# Patient Record
Sex: Female | Born: 1935 | Race: White | Hispanic: No | Smoking: Never smoker
Health system: Southern US, Community
[De-identification: ages and names within clinical notes are randomized; demographics above are authoritative.]

## PROBLEM LIST (undated history)

## (undated) DIAGNOSIS — K219 Gastro-esophageal reflux disease without esophagitis: Secondary | ICD-10-CM

## (undated) DIAGNOSIS — F321 Major depressive disorder, single episode, moderate: Secondary | ICD-10-CM

## (undated) DIAGNOSIS — R7303 Prediabetes: Secondary | ICD-10-CM

## (undated) DIAGNOSIS — M810 Age-related osteoporosis without current pathological fracture: Secondary | ICD-10-CM

## (undated) DIAGNOSIS — T7840XA Allergy, unspecified, initial encounter: Secondary | ICD-10-CM

## (undated) DIAGNOSIS — E039 Hypothyroidism, unspecified: Secondary | ICD-10-CM

## (undated) DIAGNOSIS — C801 Malignant (primary) neoplasm, unspecified: Secondary | ICD-10-CM

## (undated) DIAGNOSIS — Z972 Presence of dental prosthetic device (complete) (partial): Secondary | ICD-10-CM

## (undated) DIAGNOSIS — M1612 Unilateral primary osteoarthritis, left hip: Secondary | ICD-10-CM

## (undated) DIAGNOSIS — I10 Essential (primary) hypertension: Secondary | ICD-10-CM

## (undated) DIAGNOSIS — E785 Hyperlipidemia, unspecified: Secondary | ICD-10-CM

## (undated) DIAGNOSIS — R42 Dizziness and giddiness: Secondary | ICD-10-CM

## (undated) DIAGNOSIS — E118 Type 2 diabetes mellitus with unspecified complications: Secondary | ICD-10-CM

## (undated) DIAGNOSIS — K900 Celiac disease: Secondary | ICD-10-CM

## (undated) HISTORY — DX: Hyperlipidemia, unspecified: E78.5

## (undated) HISTORY — DX: Age-related osteoporosis without current pathological fracture: M81.0

## (undated) HISTORY — DX: Unilateral primary osteoarthritis, left hip: M16.12

## (undated) HISTORY — DX: Allergy, unspecified, initial encounter: T78.40XA

## (undated) HISTORY — PX: OTHER SURGICAL HISTORY: SHX169

## (undated) HISTORY — DX: Major depressive disorder, single episode, moderate: F32.1

## (undated) HISTORY — DX: Gastro-esophageal reflux disease without esophagitis: K21.9

---

## 1982-07-21 HISTORY — PX: BILATERAL SALPINGOOPHORECTOMY: SHX1223

## 1982-07-21 HISTORY — PX: ABDOMINAL HYSTERECTOMY: SHX81

## 1990-07-21 HISTORY — PX: TYMPANOPLASTY: SHX33

## 2004-12-02 ENCOUNTER — Ambulatory Visit: Payer: Self-pay | Admitting: Otolaryngology

## 2007-02-18 ENCOUNTER — Ambulatory Visit: Payer: Self-pay | Admitting: Internal Medicine

## 2009-10-29 ENCOUNTER — Ambulatory Visit: Payer: Self-pay | Admitting: Internal Medicine

## 2010-03-26 LAB — HM COLONOSCOPY: HM Colonoscopy: ABNORMAL

## 2010-11-14 ENCOUNTER — Ambulatory Visit: Payer: Self-pay | Admitting: Internal Medicine

## 2011-01-01 ENCOUNTER — Ambulatory Visit: Payer: Self-pay | Admitting: Internal Medicine

## 2011-11-19 ENCOUNTER — Ambulatory Visit: Payer: Self-pay | Admitting: Internal Medicine

## 2012-11-19 ENCOUNTER — Ambulatory Visit: Payer: Self-pay | Admitting: Internal Medicine

## 2013-07-21 HISTORY — PX: ORIF WRIST FRACTURE: SHX2133

## 2013-11-08 ENCOUNTER — Encounter: Payer: Self-pay | Admitting: Orthopedic Surgery

## 2013-11-18 ENCOUNTER — Encounter: Payer: Self-pay | Admitting: Orthopedic Surgery

## 2013-12-19 ENCOUNTER — Encounter: Payer: Self-pay | Admitting: Orthopedic Surgery

## 2013-12-19 LAB — HM DEXA SCAN: HM Dexa Scan: NORMAL

## 2013-12-21 ENCOUNTER — Ambulatory Visit: Payer: Self-pay | Admitting: Internal Medicine

## 2014-01-18 ENCOUNTER — Encounter: Payer: Self-pay | Admitting: Orthopedic Surgery

## 2014-04-18 ENCOUNTER — Ambulatory Visit: Payer: Self-pay | Admitting: Internal Medicine

## 2014-04-18 LAB — HM MAMMOGRAPHY: HM MAMMO: NORMAL

## 2014-06-28 ENCOUNTER — Ambulatory Visit: Payer: Self-pay | Admitting: Orthopedic Surgery

## 2014-06-29 LAB — LIPID PANEL
CHOLESTEROL: 175 mg/dL (ref 0–200)
HDL: 64 mg/dL (ref 35–70)
LDL Cholesterol: 89 mg/dL
TRIGLYCERIDES: 109 mg/dL (ref 40–160)

## 2014-10-31 ENCOUNTER — Encounter: Payer: Self-pay | Admitting: Internal Medicine

## 2014-10-31 DIAGNOSIS — E039 Hypothyroidism, unspecified: Secondary | ICD-10-CM | POA: Insufficient documentation

## 2014-10-31 DIAGNOSIS — J309 Allergic rhinitis, unspecified: Secondary | ICD-10-CM | POA: Insufficient documentation

## 2014-10-31 DIAGNOSIS — E1169 Type 2 diabetes mellitus with other specified complication: Secondary | ICD-10-CM | POA: Insufficient documentation

## 2014-10-31 DIAGNOSIS — E785 Hyperlipidemia, unspecified: Secondary | ICD-10-CM | POA: Insufficient documentation

## 2014-10-31 DIAGNOSIS — M810 Age-related osteoporosis without current pathological fracture: Secondary | ICD-10-CM | POA: Insufficient documentation

## 2014-10-31 DIAGNOSIS — K900 Celiac disease: Secondary | ICD-10-CM | POA: Insufficient documentation

## 2014-10-31 DIAGNOSIS — I10 Essential (primary) hypertension: Secondary | ICD-10-CM | POA: Insufficient documentation

## 2014-10-31 DIAGNOSIS — K219 Gastro-esophageal reflux disease without esophagitis: Secondary | ICD-10-CM | POA: Insufficient documentation

## 2015-01-01 ENCOUNTER — Ambulatory Visit: Payer: Self-pay | Admitting: Internal Medicine

## 2015-01-01 ENCOUNTER — Encounter: Payer: Self-pay | Admitting: Internal Medicine

## 2015-01-01 ENCOUNTER — Ambulatory Visit (INDEPENDENT_AMBULATORY_CARE_PROVIDER_SITE_OTHER): Payer: Medicare Other | Admitting: Internal Medicine

## 2015-01-01 VITALS — BP 120/70 | HR 60 | Ht 65.0 in | Wt 168.2 lb

## 2015-01-01 DIAGNOSIS — E039 Hypothyroidism, unspecified: Secondary | ICD-10-CM

## 2015-01-01 DIAGNOSIS — I10 Essential (primary) hypertension: Secondary | ICD-10-CM | POA: Diagnosis not present

## 2015-01-01 DIAGNOSIS — R739 Hyperglycemia, unspecified: Secondary | ICD-10-CM | POA: Diagnosis not present

## 2015-01-01 DIAGNOSIS — M75101 Unspecified rotator cuff tear or rupture of right shoulder, not specified as traumatic: Secondary | ICD-10-CM

## 2015-01-01 DIAGNOSIS — E785 Hyperlipidemia, unspecified: Secondary | ICD-10-CM | POA: Diagnosis not present

## 2015-01-01 DIAGNOSIS — Z1239 Encounter for other screening for malignant neoplasm of breast: Secondary | ICD-10-CM | POA: Diagnosis not present

## 2015-01-01 NOTE — Progress Notes (Signed)
Date:  01/01/2015   Name:  Jill Dunlap   DOB:  12-Aug-1934   MRN:  [id number]   Chief Complaint: Hypertension and Hypothyroidism   History of Present Illness:  This is a 80 y.o. female who is presenting for follow up. Hypertension This is a chronic problem. The current episode started more than 1 year ago. The problem is unchanged. The problem is controlled. Pertinent negatives include no chest pain, headaches, palpitations or shortness of breath. There are no associated agents to hypertension. Risk factors for coronary artery disease include dyslipidemia. Past treatments include ACE inhibitors and diuretics. The current treatment provides moderate improvement. There are no compliance problems.  Hypertensive end-organ damage includes a thyroid problem. There is no history of kidney disease or CVA. There is no history of hyperparathyroidism.  Thyroid Problem Presents for follow-up visit. Symptoms include constipation. Patient reports no cold intolerance, heat intolerance, leg swelling, palpitations or weight gain. The symptoms have been stable. Past treatments include levothyroxine. There is no history of diabetes.  Shoulder Injury  The incident occurred at home. The right shoulder is affected. The incident occurred more than 1 week ago. The injury mechanism was repetitive motion. The quality of the pain is described as aching. The pain does not radiate. Pertinent negatives include no chest pain. The symptoms are aggravated by overhead lifting. She has tried NSAIDs (and cortisone injections) for the symptoms. The treatment provided mild relief.  She has an appointment with Orthopedics next week.  She has been getting cortisone injections but is concerned about getting them regularly. Elevated blood sugar - glucose was 111 6 months ago.  Patient was advised to limit sweets.  She has not made any significant changes to her diet but generally follows a healthy one.  No A1C was done last  visit.  Review of Systems:  Review of Systems  Constitutional: Negative for weight gain, activity change and appetite change.  HENT: Negative for ear pain and sore throat.   Respiratory: Negative for chest tightness and shortness of breath.   Cardiovascular: Negative for chest pain, palpitations and leg swelling.  Gastrointestinal: Positive for constipation.  Endocrine: Negative for cold intolerance and heat intolerance.  Musculoskeletal: Positive for arthralgias (right shoulder and left hip).  Neurological: Negative for dizziness and headaches.  Psychiatric/Behavioral: Negative for dysphoric mood.    Patient Active Problem List   Diagnosis Date Noted  . Acquired hypothyroidism 10/31/2014  . Allergic rhinitis 10/31/2014  . Benign hypertension 10/31/2014  . CD (celiac disease) 10/31/2014  . Dyslipidemia 10/31/2014  . Acid reflux 10/31/2014  . OP (osteoporosis) 10/31/2014    Prior to Admission medications   Medication Sig Start Date End Date Taking? Authorizing Provider  alendronate (FOSAMAX) 70 MG tablet Take 1 tablet by mouth once a week. 01/05/14  Yes Historical Provider, MD  Biotin 2500 MCG CAPS Take 1 capsule by mouth daily.   Yes Historical Provider, MD  Calcium-Vitamin D-Vitamin K [phone] MG-UNT-MCG CHEW Chew 1 tablet by mouth daily.   Yes Historical Provider, MD  fexofenadine (ALLEGRA) 180 MG tablet Take 1 tablet by mouth daily.   Yes Historical Provider, MD  fluticasone (FLONASE) 50 MCG/ACT nasal spray Place 2 sprays into both nostrils daily. 11/29/13  Yes Historical Provider, MD  levothyroxine (SYNTHROID, LEVOTHROID) 50 MCG tablet Take 1 tablet by mouth daily. 11/29/13  Yes Historical Provider, MD  lisinopril-hydrochlorothiazide (PRINZIDE,ZESTORETIC) 10-12.5 MG per tablet Take 1 tablet by mouth daily. 11/29/13  Yes Historical Provider, MD  Omega-3 Fatty Acids ([place])  1000 MG CPDR Take 1 capsule by mouth daily.   Yes Historical Provider, MD  omeprazole (PRILOSEC) 20 MG  capsule Take 1 capsule by mouth daily. 01/31/14  Yes Historical Provider, MD  simvastatin (ZOCOR) 20 MG tablet Take 1 tablet by mouth at bedtime. 06/29/14  Yes Historical Provider, MD  meloxicam (MOBIC) 7.5 MG tablet Take 1 tablet by mouth daily.    Historical Provider, MD    No Known Allergies  Past Surgical History  Procedure Laterality Date  . Orif wrist fracture Left 2015  . Abdominal hysterectomy  1984    for bleeding  . Bilateral salpingoophorectomy  1984  . Tympanoplasty  1992    History  Substance Use Topics  . Smoking status: Never Smoker   . Smokeless tobacco: Not on file  . Alcohol Use: Not on file     Medication list has been reviewed and updated.  Physical Examination:  Physical Exam  Constitutional: She is oriented to person, place, and time. She appears well-developed and well-nourished. No distress.  HENT:  Right Ear: External ear normal.  Left Ear: External ear normal.  Eyes: Conjunctivae are normal.  Neck: Normal range of motion. Neck supple. No thyromegaly present.  Cardiovascular: Normal rate, regular rhythm and normal heart sounds.   Pulmonary/Chest: Breath sounds normal. No respiratory distress.  Musculoskeletal: She exhibits no edema.       Left shoulder: She exhibits decreased range of motion and tenderness.  Lymphadenopathy:    She has no cervical adenopathy.  Neurological: She is alert and oriented to person, place, and time. She has normal reflexes.  Skin: No rash noted. No erythema.  Psychiatric: She has a normal mood and affect.    BP 120/70 mmHg  Pulse 60  Ht 5' 5"  (1.651 m)  Wt 168 lb 3.2 oz (76.295 kg)  BMI 27.99 kg/m2  Assessment and Plan: 1. Benign hypertension Controlled - continue current therapy - CBC with Differential/Platelet  2. Acquired hypothyroidism supplemented - TSH  3. Dyslipidemia On daily simvastatin (taking 1/2 of 20 mg) - Lipid panel - Comprehensive metabolic panel  4. Borderline  hyperglycemia Reinforce low carb diet - Hemoglobin A1c  5. Breast cancer screening  - MM DIGITAL SCREENING BILATERAL; Future  6. Rotator cuff tear, right May take mobic 7.5 mg daily or aleve 220 mg daily prn Follow up with Orthopedics as planned   Halina Maidens, MD [facility]  01/01/2015

## 2015-01-01 NOTE — Patient Instructions (Signed)
Diabetes Mellitus and Food It is important for you to manage your blood sugar (glucose) level. Your blood glucose level can be greatly affected by what you eat. Eating healthier foods in the appropriate amounts throughout the day at about the same time each day will help you control your blood glucose level. It can also help slow or prevent worsening of your diabetes mellitus. Healthy eating may even help you improve the level of your blood pressure and reach or maintain a healthy weight.  HOW CAN FOOD AFFECT ME? Carbohydrates Carbohydrates affect your blood glucose level more than any other type of food. Your dietitian will help you determine how many carbohydrates to eat at each meal and teach you how to count carbohydrates. Counting carbohydrates is important to keep your blood glucose at a healthy level, especially if you are using insulin or taking certain medicines for diabetes mellitus. Alcohol Alcohol can cause sudden decreases in blood glucose (hypoglycemia), especially if you use insulin or take certain medicines for diabetes mellitus. Hypoglycemia can be a life-threatening condition. Symptoms of hypoglycemia (sleepiness, dizziness, and disorientation) are similar to symptoms of having too much alcohol.  If your health care provider has given you approval to drink alcohol, do so in moderation and use the following guidelines:  Women should not have more than one drink per day, and men should not have more than two drinks per day. One drink is equal to:  12 oz of beer.  5 oz of wine.  1 oz of hard liquor.  Do not drink on an empty stomach.  Keep yourself hydrated. Have water, diet soda, or unsweetened iced tea.  Regular soda, juice, and other mixers might contain a lot of carbohydrates and should be counted. WHAT FOODS ARE NOT RECOMMENDED? As you make food choices, it is important to remember that all foods are not the same. Some foods have fewer nutrients per serving than other  foods, even though they might have the same number of calories or carbohydrates. It is difficult to get your body what it needs when you eat foods with fewer nutrients. Examples of foods that you should avoid that are high in calories and carbohydrates but low in nutrients include:  Trans fats (most processed foods list trans fats on the Nutrition Facts label).  Regular soda.  Juice.  Candy.  Sweets, such as cake, pie, doughnuts, and cookies.  Fried foods. WHAT FOODS CAN I EAT? Have nutrient-rich foods, which will nourish your body and keep you healthy. The food you should eat also will depend on several factors, including:  The calories you need.  The medicines you take.  Your weight.  Your blood glucose level.  Your blood pressure level.  Your cholesterol level. You also should eat a variety of foods, including:  Protein, such as meat, poultry, fish, tofu, nuts, and seeds (lean animal proteins are best).  Fruits.  Vegetables.  Dairy products, such as milk, cheese, and yogurt (low fat is best).  Breads, grains, pasta, cereal, rice, and beans.  Fats such as olive oil, trans fat-free margarine, canola oil, avocado, and olives. DOES EVERYONE WITH DIABETES MELLITUS HAVE THE SAME MEAL PLAN? Because every person with diabetes mellitus is different, there is not one meal plan that works for everyone. It is very important that you meet with a dietitian who will help you create a meal plan that is just right for you. Document Released: 04/03/2005 Document Revised: 07/12/2013 Document Reviewed: 06/03/2013 Exit[employer] Patient Information 2015 [employer]. This   information is not intended to replace advice given to you by your health care provider. Make sure you discuss any questions you have with your health care provider.  

## 2015-01-02 LAB — LIPID PANEL
Chol/HDL Ratio: 2.9 ratio units (ref 0.0–4.4)
Cholesterol, Total: 176 mg/dL (ref 100–199)
HDL: 61 mg/dL (ref >39)
LDL CALC: 95 mg/dL (ref 0–99)
Triglycerides: 102 mg/dL (ref 0–149)
VLDL Cholesterol Cal: 20 mg/dL (ref 5–40)

## 2015-01-02 LAB — CBC WITH DIFFERENTIAL/PLATELET
BASOS: 1 %
Basophils Absolute: 0 10*3/uL (ref 0.0–0.2)
EOS (ABSOLUTE): 0.3 10*3/uL (ref 0.0–0.4)
EOS: 7 %
HEMATOCRIT: 44.7 % (ref 34.0–46.6)
HEMOGLOBIN: 14.5 g/dL (ref 11.1–15.9)
Immature Grans (Abs): 0 10*3/uL (ref 0.0–0.1)
Immature Granulocytes: 0 %
LYMPHS ABS: 1.7 10*3/uL (ref 0.7–3.1)
LYMPHS: 34 %
MCH: 29.1 pg (ref 26.6–33.0)
MCHC: 32.4 g/dL (ref 31.5–35.7)
MCV: 90 fL (ref 79–97)
Monocytes Absolute: 0.4 10*3/uL (ref 0.1–0.9)
Monocytes: 7 %
NEUTROS ABS: 2.6 10*3/uL (ref 1.4–7.0)
Neutrophils: 51 %
Platelets: 169 10*3/uL (ref 150–379)
RBC: 4.98 x10E6/uL (ref 3.77–5.28)
RDW: 13.6 % (ref 12.3–15.4)
WBC: 5.1 10*3/uL (ref 3.4–10.8)

## 2015-01-02 LAB — COMPREHENSIVE METABOLIC PANEL
A/G RATIO: 1.7 (ref 1.1–2.5)
ALT: 35 IU/L — ABNORMAL HIGH (ref 0–32)
AST: 28 IU/L (ref 0–40)
Albumin: 4.5 g/dL (ref 3.5–4.8)
Alkaline Phosphatase: 35 IU/L — ABNORMAL LOW (ref 39–117)
BUN/Creatinine Ratio: 20 (ref 11–26)
BUN: 15 mg/dL (ref 8–27)
Bilirubin Total: 0.5 mg/dL (ref 0.0–1.2)
CALCIUM: 9.6 mg/dL (ref 8.7–10.3)
CO2: 25 mmol/L (ref 18–29)
CREATININE: 0.76 mg/dL (ref 0.57–1.00)
Chloride: 102 mmol/L (ref 97–108)
GFR calc Af Amer: 87 mL/min/{1.73_m2} (ref >59)
GFR, EST NON AFRICAN AMERICAN: 75 mL/min/{1.73_m2} (ref >59)
GLOBULIN, TOTAL: 2.7 g/dL (ref 1.5–4.5)
GLUCOSE: 96 mg/dL (ref 65–99)
POTASSIUM: 3.9 mmol/L (ref 3.5–5.2)
Sodium: 143 mmol/L (ref 134–144)
TOTAL PROTEIN: 7.2 g/dL (ref 6.0–8.5)

## 2015-01-02 LAB — HEMOGLOBIN A1C
Est. average glucose Bld gHb Est-mCnc: 137 mg/dL
Hgb A1c MFr Bld: 6.4 % — ABNORMAL HIGH (ref 4.8–5.6)

## 2015-01-02 LAB — TSH: TSH: 1.25 u[IU]/mL (ref 0.450–4.500)

## 2015-01-11 DIAGNOSIS — S43429A Sprain of unspecified rotator cuff capsule, initial encounter: Secondary | ICD-10-CM | POA: Insufficient documentation

## 2015-02-05 ENCOUNTER — Other Ambulatory Visit: Payer: Self-pay | Admitting: Internal Medicine

## 2015-03-02 ENCOUNTER — Ambulatory Visit (INDEPENDENT_AMBULATORY_CARE_PROVIDER_SITE_OTHER): Payer: Medicare Other | Admitting: Family Medicine

## 2015-03-02 ENCOUNTER — Encounter: Payer: Self-pay | Admitting: Family Medicine

## 2015-03-02 VITALS — BP 130/70 | HR 76 | Ht 65.0 in | Wt 162.4 lb

## 2015-03-02 DIAGNOSIS — R300 Dysuria: Secondary | ICD-10-CM | POA: Diagnosis not present

## 2015-03-02 DIAGNOSIS — N390 Urinary tract infection, site not specified: Secondary | ICD-10-CM | POA: Diagnosis not present

## 2015-03-02 LAB — POCT URINALYSIS DIPSTICK
Bilirubin, UA: NEGATIVE
Glucose, UA: NEGATIVE
KETONES UA: NEGATIVE
Nitrite, UA: POSITIVE
PROTEIN UA: NEGATIVE
SPEC GRAV UA: 1.02
UROBILINOGEN UA: 0.2
pH, UA: 5

## 2015-03-02 MED ORDER — CIPROFLOXACIN HCL 250 MG PO TABS: 250.0000 mg | ORAL_TABLET | Freq: Two times a day (BID) | ORAL | Status: DC

## 2015-03-02 MED ORDER — PHENAZOPYRIDINE HCL 200 MG PO TABS: 200.0000 mg | ORAL_TABLET | Freq: Three times a day (TID) | ORAL | Status: DC | PRN

## 2015-03-02 NOTE — Progress Notes (Signed)
Date:  03/02/2015   Name:  Jill Dunlap   DOB:  [birth year]   MRN:  [id number]  PCP:  Halina Maidens, MD    Chief Complaint: Urinary Tract Infection   History of Present Illness:  This is a 80 y.o. female with 1 week hx dysuria improved on Pyridium now out. Having some lower abdominal pain but unsure if from concurrent celiac dz. Denies fever, back pain. Leaving for vacation soon so wants to avoid sun sensitive abx. Hx recurrent UTI but no abx for past year.  Review of Systems:  Review of Systems  Patient Active Problem List   Diagnosis Date Noted  . Sprain of rotator cuff capsule 01/11/2015  . Acquired hypothyroidism 10/31/2014  . Allergic rhinitis 10/31/2014  . Benign hypertension 10/31/2014  . CD (celiac disease) 10/31/2014  . Dyslipidemia 10/31/2014  . Acid reflux 10/31/2014  . OP (osteoporosis) 10/31/2014    Prior to Admission medications   Medication Sig Start Date End Date Taking? Authorizing Provider  alendronate (FOSAMAX) 70 MG tablet Take 1 tablet by mouth once a week. 01/05/14  Yes Historical Provider, MD  Biotin 2500 MCG CAPS Take 1 capsule by mouth daily.   Yes Historical Provider, MD  Calcium-Vitamin D-Vitamin K [phone] MG-UNT-MCG CHEW Chew 1 tablet by mouth daily.   Yes Historical Provider, MD  fexofenadine (ALLEGRA) 180 MG tablet Take 1 tablet by mouth daily.   Yes Historical Provider, MD  fluticasone (FLONASE) 50 MCG/ACT nasal spray Place 2 sprays into both nostrils daily. 11/29/13  Yes Historical Provider, MD  levothyroxine (SYNTHROID, LEVOTHROID) 50 MCG tablet Take 1 tablet by mouth daily. 11/29/13  Yes Historical Provider, MD  lisinopril-hydrochlorothiazide (PRINZIDE,ZESTORETIC) 10-12.5 MG per tablet Take 1 tablet by mouth daily. 11/29/13  Yes Historical Provider, MD  meloxicam (MOBIC) 7.5 MG tablet Take 1 tablet by mouth daily.   Yes Historical Provider, MD  Omega-3 Fatty Acids (FISH OIL) 1000 MG CPDR Take 1 capsule by mouth daily.   Yes Historical  Provider, MD  omeprazole (PRILOSEC) 20 MG capsule TAKE ONE CAPSULE BY MOUTH EVERY DAY 02/06/15  Yes Glean Hess, MD  simvastatin (ZOCOR) 20 MG tablet Take 1 tablet by mouth at bedtime. 06/29/14  Yes Historical Provider, MD  ciprofloxacin (CIPRO) 250 MG tablet Take 1 tablet (250 mg total) by mouth 2 (two) times daily. 03/02/15   Adline Potter, MD  phenazopyridine (PYRIDIUM) 200 MG tablet Take 1 tablet (200 mg total) by mouth 3 (three) times daily as needed for pain. 03/02/15   Adline Potter, MD    No Known Allergies  Past Surgical History  Procedure Laterality Date  . Orif wrist fracture Left 2015  . Abdominal hysterectomy  1984    for bleeding  . Bilateral salpingoophorectomy  1984  . Tympanoplasty  1992    Social History  Substance Use Topics  . Smoking status: Never Smoker   . Smokeless tobacco: None  . Alcohol Use: None    Family History  Problem Relation Age of Onset  . Cancer Mother   . Hypertension Father   . Heart disease Father     Medication list has been reviewed and updated.  Physical Examination: BP 130/70 mmHg  Pulse 76  Ht 5' 5"  (1.651 m)  Wt 162 lb 6.4 oz (73.664 kg)  BMI 27.02 kg/m2  Physical Exam  Constitutional: She appears well-developed and well-nourished. No distress.  Genitourinary:  No suprapubic or CVAT    Assessment and Plan:  1. Dysuria - POCT urinalysis dipstick -  Urine Culture  2. UTI (lower urinary tract infection) UA positive, Cipro x 5d, refill Pyridium per pt request  Return if symptoms worsen or fail to improve.  Satira Anis. [facility]  03/02/2015

## 2015-03-04 LAB — URINE CULTURE

## 2015-03-05 ENCOUNTER — Telehealth: Payer: Self-pay

## 2015-03-05 MED ORDER — AMOXICILLIN 500 MG PO TABS: 500.0000 mg | ORAL_TABLET | Freq: Two times a day (BID) | ORAL | Status: DC

## 2015-03-05 NOTE — Telephone Encounter (Signed)
Patient states she took cipro on Friday once, twice on Saturday and woke up Sunday with rash and hoarseness, can you send different antibiotic for UTI.

## 2015-03-05 NOTE — Telephone Encounter (Signed)
Stop Cipro, begin amoxicllin, rx sent

## 2015-03-15 ENCOUNTER — Encounter: Payer: Self-pay | Admitting: Internal Medicine

## 2015-03-15 DIAGNOSIS — R7303 Prediabetes: Secondary | ICD-10-CM | POA: Insufficient documentation

## 2015-03-15 DIAGNOSIS — E118 Type 2 diabetes mellitus with unspecified complications: Secondary | ICD-10-CM | POA: Insufficient documentation

## 2015-04-26 ENCOUNTER — Encounter: Payer: Self-pay | Admitting: Internal Medicine

## 2015-04-26 ENCOUNTER — Ambulatory Visit (INDEPENDENT_AMBULATORY_CARE_PROVIDER_SITE_OTHER): Payer: Medicare Other | Admitting: Internal Medicine

## 2015-04-26 VITALS — BP 120/76 | HR 68 | Ht 65.0 in | Wt 160.0 lb

## 2015-04-26 DIAGNOSIS — I10 Essential (primary) hypertension: Secondary | ICD-10-CM

## 2015-04-26 DIAGNOSIS — R7303 Prediabetes: Secondary | ICD-10-CM

## 2015-04-26 DIAGNOSIS — K900 Celiac disease: Secondary | ICD-10-CM | POA: Diagnosis not present

## 2015-04-26 NOTE — Progress Notes (Signed)
Date:  04/26/2015   Name:  Jill Dunlap   DOB:  [birth year]   MRN:  [id number]   Chief Complaint: No chief complaint on file. Follow up on elevated A1C - last visit 6.4. She is on a relatively low carb diet due to her celiac disease. She's working harder on avoiding sweets as well. She's lost a couple of pounds since her last visit. We discussed fingerstick blood sugar testing but she is hesitant to begin. She is already taking an ACE inhibitor and a statin. Aspirin is contraindicated due to celiac disease.   Review of Systems  Constitutional: Negative for fever, fatigue and unexpected weight change.  Eyes: Negative for visual disturbance.  Respiratory: Negative for choking, shortness of breath and wheezing.   Cardiovascular: Negative for chest pain, palpitations and leg swelling.  Gastrointestinal: Negative for abdominal pain.  Endocrine: Negative for polydipsia and polyuria.  Genitourinary: Negative for dysuria.    Patient Active Problem List   Diagnosis Date Noted  . Glucose intolerance (pre-diabetes) 03/15/2015  . Sprain of rotator cuff capsule 01/11/2015  . Acquired hypothyroidism 10/31/2014  . Allergic rhinitis 10/31/2014  . Benign hypertension 10/31/2014  . CD (celiac disease) 10/31/2014  . Dyslipidemia 10/31/2014  . Acid reflux 10/31/2014  . OP (osteoporosis) 10/31/2014    Prior to Admission medications   Medication Sig Start Date End Date Taking? Authorizing Provider  alendronate (FOSAMAX) 70 MG tablet Take 1 tablet by mouth once a week. 01/05/14  Yes Historical Provider, MD  Biotin 2500 MCG CAPS Take 1 capsule by mouth daily.   Yes Historical Provider, MD  Calcium-Vitamin D-Vitamin K [phone] MG-UNT-MCG CHEW Chew 1 tablet by mouth daily.   Yes Historical Provider, MD  fexofenadine (ALLEGRA) 180 MG tablet Take 1 tablet by mouth daily.   Yes Historical Provider, MD  fluticasone (FLONASE) 50 MCG/ACT nasal spray Place 2 sprays into both nostrils daily. 11/29/13  Yes  Historical Provider, MD  levothyroxine (SYNTHROID, LEVOTHROID) 50 MCG tablet Take 1 tablet by mouth daily. 11/29/13  Yes Historical Provider, MD  lisinopril-hydrochlorothiazide (PRINZIDE,ZESTORETIC) 10-12.5 MG per tablet Take 1 tablet by mouth daily. 11/29/13  Yes Historical Provider, MD  meloxicam (MOBIC) 7.5 MG tablet Take 1 tablet by mouth daily.   Yes Historical Provider, MD  Omega-3 Fatty Acids (FISH OIL) 1000 MG CPDR Take 1 capsule by mouth daily.   Yes Historical Provider, MD  phenazopyridine (PYRIDIUM) 200 MG tablet Take 1 tablet (200 mg total) by mouth 3 (three) times daily as needed for pain. 03/02/15  Yes Adline Potter, MD  simvastatin (ZOCOR) 20 MG tablet Take 1 tablet by mouth at bedtime. 06/29/14  Yes Historical Provider, MD  omeprazole (PRILOSEC) 20 MG capsule TAKE ONE CAPSULE BY MOUTH EVERY DAY Patient not taking: Reported on 04/26/2015 02/06/15   Glean Hess, MD    Allergies  Allergen Reactions  . Ciprofloxacin Hcl Rash    Past Surgical History  Procedure Laterality Date  . Orif wrist fracture Left 2015  . Abdominal hysterectomy  1984    for bleeding  . Bilateral salpingoophorectomy  1984  . Tympanoplasty  1992    Social History  Substance Use Topics  . Smoking status: Never Smoker   . Smokeless tobacco: None  . Alcohol Use: None    Medication list has been reviewed and updated.  Physical Exam  Constitutional: She is oriented to person, place, and time. She appears well-developed and well-nourished.  Neck: Normal range of motion. Neck supple. Carotid bruit is not present.  Cardiovascular: Normal rate, regular rhythm and normal heart sounds.   Pulmonary/Chest: Effort normal and breath sounds normal.  Musculoskeletal: She exhibits no edema.  Neurological: She is alert and oriented to person, place, and time.  Psychiatric: She has a normal mood and affect.  Nursing note and vitals reviewed.   BP 120/76 mmHg  Pulse 68  Ht 5' 5"  (1.651 m)  Wt 160 lb (72.576  kg)  BMI 26.63 kg/m2  Assessment and Plan: 1. Prediabetes Continue the carb diet and weight control Will advise on medication if A1c is greater than 7 - Hemoglobin A1c  2. Benign hypertension Controlled on current medications  3. CD (celiac disease) Stable with dietary modifications  Halina Maidens, MD [facility]  04/26/2015

## 2015-04-27 LAB — HEMOGLOBIN A1C
ESTIMATED AVERAGE GLUCOSE: 134 mg/dL
Hgb A1c MFr Bld: 6.3 % — ABNORMAL HIGH (ref 4.8–5.6)

## 2015-05-11 ENCOUNTER — Encounter: Payer: Self-pay | Admitting: Internal Medicine

## 2015-05-14 ENCOUNTER — Ambulatory Visit (INDEPENDENT_AMBULATORY_CARE_PROVIDER_SITE_OTHER): Payer: Medicare Other | Admitting: Internal Medicine

## 2015-05-14 ENCOUNTER — Encounter: Payer: Self-pay | Admitting: Internal Medicine

## 2015-05-14 VITALS — BP 122/60 | HR 72 | Ht 65.0 in | Wt 165.0 lb

## 2015-05-14 DIAGNOSIS — M810 Age-related osteoporosis without current pathological fracture: Secondary | ICD-10-CM | POA: Diagnosis not present

## 2015-05-14 DIAGNOSIS — K219 Gastro-esophageal reflux disease without esophagitis: Secondary | ICD-10-CM

## 2015-05-14 DIAGNOSIS — E039 Hypothyroidism, unspecified: Secondary | ICD-10-CM

## 2015-05-14 DIAGNOSIS — R1310 Dysphagia, unspecified: Secondary | ICD-10-CM | POA: Diagnosis not present

## 2015-05-14 NOTE — Progress Notes (Signed)
Date:  05/14/2015   Name:  Jill Dunlap   DOB:  [birth year]   MRN:  [id number]   Chief Complaint: Dysphagia HPI Patient complains of difficulty swallowing pills. Only occasionally does she have trouble swallowing food.  She feels as if there is a lump in her upper throat. This has been present for some time. She read her Fosamax insert and became concerned this could be a side effect. She does have known reflux for which she was previously taking omeprazole. She became concerned about side effects from omeprazole so switched to Zantac 75. She is not aware of any heartburn indigestion vomiting or epigastric discomfort. She does not wake at night from reflux.  She has chronic allergies and sinus problems. She uses Flonase and takes an allergy medication. She's been seen by ENT primarily for ear complaints. She's never had a laryngoscope for evaluation of her vocal cords. She takes Fosamax strictly as prescribed. This medication seems to give her the most difficulty with swallowing and she is concerned that she might choke since she cannot eat after taking the medication.    Review of Systems  Constitutional: Negative for fever, fatigue and unexpected weight change.  HENT: Positive for sinus pressure, trouble swallowing and voice change. Negative for tinnitus.   Respiratory: Negative for cough and chest tightness.   Cardiovascular: Negative for chest pain and palpitations.  Gastrointestinal: Negative for abdominal pain and abdominal distention.       History of reflux previously on omeprazole but switched to Zantac 75    Patient Active Problem List   Diagnosis Date Noted  . Prediabetes 03/15/2015  . Sprain of rotator cuff capsule 01/11/2015  . Acquired hypothyroidism 10/31/2014  . Allergic rhinitis 10/31/2014  . Benign hypertension 10/31/2014  . CD (celiac disease) 10/31/2014  . Dyslipidemia 10/31/2014  . Acid reflux 10/31/2014  . OP (osteoporosis) 10/31/2014    Prior to Admission  medications   Medication Sig Start Date End Date Taking? Authorizing Provider  alendronate (FOSAMAX) 70 MG tablet Take 1 tablet by mouth once a week. 01/05/14  Yes Historical Provider, MD  Biotin 2500 MCG CAPS Take 1 capsule by mouth daily.   Yes Historical Provider, MD  Calcium-Vitamin D-Vitamin K [phone] MG-UNT-MCG CHEW Chew 1 tablet by mouth daily.   Yes Historical Provider, MD  fexofenadine (ALLEGRA) 180 MG tablet Take 1 tablet by mouth daily.   Yes Historical Provider, MD  fluticasone (FLONASE) 50 MCG/ACT nasal spray Place 2 sprays into both nostrils daily. 11/29/13  Yes Historical Provider, MD  levothyroxine (SYNTHROID, LEVOTHROID) 50 MCG tablet Take 1 tablet by mouth daily. 11/29/13  Yes Historical Provider, MD  lisinopril-hydrochlorothiazide (PRINZIDE,ZESTORETIC) 10-12.5 MG per tablet Take 1 tablet by mouth daily. 11/29/13  Yes Historical Provider, MD  meloxicam (MOBIC) 7.5 MG tablet Take 1 tablet by mouth daily.   Yes Historical Provider, MD  Omega-3 Fatty Acids (FISH OIL) 1000 MG CPDR Take 1 capsule by mouth daily.   Yes Historical Provider, MD  simvastatin (ZOCOR) 20 MG tablet Take 1 tablet by mouth at bedtime. 06/29/14  Yes Historical Provider, MD  omeprazole (PRILOSEC) 20 MG capsule TAKE ONE CAPSULE BY MOUTH EVERY DAY Patient not taking: Reported on 05/14/2015 02/06/15   Glean Hess, MD    Allergies  Allergen Reactions  . Ciprofloxacin Hcl Rash    Past Surgical History  Procedure Laterality Date  . Orif wrist fracture Left 2015  . Abdominal hysterectomy  1984    for bleeding  . Bilateral salpingoophorectomy  [place] History  Substance Use Topics  . Smoking status: Never Smoker   . Smokeless tobacco: None  . Alcohol Use: No     Medication list has been reviewed and updated.   Physical Exam  Constitutional: She appears well-developed and well-nourished.  HENT:  Right Ear: Tympanic membrane and ear canal normal.  Left Ear:  Tympanic membrane and ear canal normal.  Nose: Right sinus exhibits no maxillary sinus tenderness and no frontal sinus tenderness. Left sinus exhibits no maxillary sinus tenderness and no frontal sinus tenderness.  Mouth/Throat: Uvula is midline and oropharynx is clear and moist.  Eyes: Pupils are equal, round, and reactive to light.  Neck: Normal range of motion. Neck supple. No thyromegaly present.  Cardiovascular: Normal rate and regular rhythm.   Pulmonary/Chest: Effort normal and breath sounds normal.  Lymphadenopathy:    She has no cervical adenopathy.    BP 122/60 mmHg  Pulse 72  Ht 5' 5"  (1.651 m)  Wt 165 lb (74.844 kg)  BMI 27.46 kg/m2  Assessment and Plan: 1. Dysphagia Recommend evaluation by ENT - patient will schedule her an appointment  2. Acquired hypothyroidism Continue supplementation No evidence of thyroid enlargement  3. OP (osteoporosis) Continue Fosamax weekly Does not appear to be involved in her current symptoms  4. Gastroesophageal reflux disease, esophagitis presence not specified Continue Zantac 75 milligrams daily   Halina Maidens, MD [facility]  05/14/2015

## 2015-07-03 ENCOUNTER — Encounter: Payer: Self-pay | Admitting: Internal Medicine

## 2015-07-03 ENCOUNTER — Other Ambulatory Visit: Payer: Self-pay | Admitting: Internal Medicine

## 2015-07-03 ENCOUNTER — Ambulatory Visit (INDEPENDENT_AMBULATORY_CARE_PROVIDER_SITE_OTHER): Payer: Medicare Other | Admitting: Internal Medicine

## 2015-07-03 VITALS — BP 110/60 | HR 72 | Ht 65.0 in | Wt 161.6 lb

## 2015-07-03 DIAGNOSIS — E039 Hypothyroidism, unspecified: Secondary | ICD-10-CM | POA: Diagnosis not present

## 2015-07-03 DIAGNOSIS — K900 Celiac disease: Secondary | ICD-10-CM | POA: Diagnosis not present

## 2015-07-03 DIAGNOSIS — R7303 Prediabetes: Secondary | ICD-10-CM | POA: Diagnosis not present

## 2015-07-03 DIAGNOSIS — E785 Hyperlipidemia, unspecified: Secondary | ICD-10-CM

## 2015-07-03 DIAGNOSIS — I10 Essential (primary) hypertension: Secondary | ICD-10-CM | POA: Diagnosis not present

## 2015-07-03 DIAGNOSIS — N390 Urinary tract infection, site not specified: Secondary | ICD-10-CM | POA: Diagnosis not present

## 2015-07-03 DIAGNOSIS — K219 Gastro-esophageal reflux disease without esophagitis: Secondary | ICD-10-CM | POA: Diagnosis not present

## 2015-07-03 LAB — POC URINALYSIS WITH MICROSCOPIC (NON AUTO)MANUAL RESULT
Bilirubin, UA: NEGATIVE
CRYSTALS: 0
Epithelial cells, urine per micros: 0
GLUCOSE UA: NEGATIVE
Ketones, UA: NEGATIVE
MUCUS UA: 0
Nitrite, UA: POSITIVE
PROTEIN UA: NEGATIVE
RBC: 2 M/uL — AB (ref 4.04–5.48)
SPEC GRAV UA: 1.02
Urobilinogen, UA: 0.2
pH, UA: 6.5

## 2015-07-03 MED ORDER — NITROFURANTOIN MONOHYD MACRO 100 MG PO CAPS
100.0000 mg | ORAL_CAPSULE | Freq: Two times a day (BID) | ORAL | Status: DC
Start: 2015-07-03 — End: 2015-12-04

## 2015-07-03 NOTE — Progress Notes (Signed)
Patient: Jill Dunlap, Female    DOB: 04/24/1935, 80 y.o.   MRN: [id number] Visit Date: 07/03/2015  Today's Provider: Halina Maidens, MD   Chief Complaint  Patient presents with  . Medicare Wellness  . Hypertension  . Hyperlipidemia  . Hypothyroidism   Subjective:    Annual wellness visit Jill Dunlap is a 80 y.o. female who presents today for her Subsequent Annual Wellness Visit. She feels fairly well. She reports exercising none but stays active with housework and family. She reports she is sleeping fairly well. She denies breast problems.  She continues to follow a gluten free diet.  She has minimal abdominal symptoms and no diarrhea or bleeding.  ----------------------------------------------------------- Hypertension This is a chronic problem. The current episode started more than 1 year ago. The problem is unchanged. The problem is controlled. Pertinent negatives include no chest pain, headaches, palpitations or shortness of breath. Past treatments include ACE inhibitors and diuretics. The current treatment provides significant improvement. There are no compliance problems.   Hyperlipidemia This is a chronic problem. The problem is controlled. Recent lipid tests were reviewed and are normal. Pertinent negatives include no chest pain, focal weakness, leg pain, myalgias or shortness of breath. Current antihyperlipidemic treatment includes statins. The current treatment provides significant improvement of lipids.  Gastroesophageal Reflux She complains of choking. She reports no abdominal pain, no chest pain, no nausea or no wheezing. seen by ENT and told to take both PPI and H2 blocker. This is a chronic problem. The problem occurs frequently. The problem has been waxing and waning. Pertinent negatives include no fatigue. She has tried a PPI and a histamine-2 antagonist for the symptoms. The treatment provided moderate relief.  She was seen by ENT who told her she had  inflammation and needed to take omeprazole 40 mg along with zantac.  She is concerned about interference with calcium absorption.  She will dose calcium 2 tabs in the morning and take omeprazole in the evening.  Review of Systems  Constitutional: Negative for fever, chills, fatigue and unexpected weight change.  HENT: Negative for hearing loss, postnasal drip and sinus pressure.   Eyes: Negative for visual disturbance.  Respiratory: Positive for choking. Negative for shortness of breath and wheezing.   Cardiovascular: Negative for chest pain, palpitations and leg swelling.  Gastrointestinal: Negative for nausea, abdominal pain, constipation and blood in stool.  Genitourinary: Negative for dysuria and hematuria.  Musculoskeletal: Negative for myalgias, arthralgias and gait problem.  Skin: Negative for color change and rash.  Neurological: Negative for dizziness, focal weakness, light-headedness and headaches.  Hematological: Negative for adenopathy. Does not bruise/bleed easily.  Psychiatric/Behavioral: Negative for sleep disturbance and dysphoric mood.    Social History   Social History  . Marital Status: Married    Spouse Name: N/A  . Number of Children: N/A  . Years of Education: N/A   Occupational History  . Not on file.   Social History Main Topics  . Smoking status: Never Smoker   . Smokeless tobacco: Not on file  . Alcohol Use: No  . Drug Use: No  . Sexual Activity: Not on file   Other Topics Concern  . Not on file   Social History Narrative    Patient Active Problem List   Diagnosis Date Noted  . Prediabetes 03/15/2015  . Sprain of rotator cuff capsule 01/11/2015  . Acquired hypothyroidism 10/31/2014  . Allergic rhinitis 10/31/2014  . Benign hypertension 10/31/2014  . CD (celiac disease) 10/31/2014  . Dyslipidemia  10/31/2014  . Acid reflux 10/31/2014  . OP (osteoporosis) 10/31/2014    Past Surgical History  Procedure Laterality Date  . Orif wrist  fracture Left 2015  . Abdominal hysterectomy  1984    for bleeding  . Bilateral salpingoophorectomy  1984  . Tympanoplasty  1992    Her family history includes Cancer in her mother; Heart disease in her father; Hypertension in her father.    Previous Medications   ALENDRONATE (FOSAMAX) 70 MG TABLET    Take 1 tablet by mouth once a week.   BIOTIN 2500 MCG CAPS    Take 1 capsule by mouth daily.   CALCIUM-VITAMIN D-VITAMIN K [phone] MG-UNT-MCG CHEW    Chew 1 tablet by mouth daily.   FEXOFENADINE (ALLEGRA) 180 MG TABLET    Take 1 tablet by mouth daily.   FLUTICASONE (FLONASE) 50 MCG/ACT NASAL SPRAY    Place 2 sprays into both nostrils daily.   LEVOTHYROXINE (SYNTHROID, LEVOTHROID) 50 MCG TABLET    Take 1 tablet by mouth daily.   LISINOPRIL-HYDROCHLOROTHIAZIDE (PRINZIDE,ZESTORETIC) 10-12.5 MG PER TABLET    Take 1 tablet by mouth daily.   OMEGA-3 FATTY ACIDS (FISH OIL) 1000 MG CPDR    Take 1 capsule by mouth daily.   OMEPRAZOLE (PRILOSEC) 40 MG CAPSULE    Take 1 capsule by mouth daily.   RANITIDINE (ZANTAC) 75 MG TABLET    Take 75 mg by mouth 2 (two) times daily.   SIMVASTATIN (ZOCOR) 20 MG TABLET    Take 1 tablet by mouth at bedtime.    Patient Care Team: Glean Hess, MD as PCP - General (Family Medicine)     Objective:   Vitals: BP 110/60 mmHg  Pulse 72  Ht 5' 5"  (1.651 m)  Wt 161 lb 9.6 oz (73.301 kg)  BMI 26.89 kg/m2  Physical Exam  Constitutional: She is oriented to person, place, and time. She appears well-developed and well-nourished. No distress.  HENT:  Head: Normocephalic and atraumatic.  Right Ear: Tympanic membrane and ear canal normal.  Left Ear: Tympanic membrane and ear canal normal.  Nose: Right sinus exhibits no maxillary sinus tenderness. Left sinus exhibits no maxillary sinus tenderness.  Mouth/Throat: Uvula is midline and oropharynx is clear and moist.  Eyes: Conjunctivae and EOM are normal. Right eye exhibits no discharge. Left eye exhibits no  discharge. No scleral icterus.  Neck: Normal range of motion. Carotid bruit is not present. No erythema present. No thyromegaly present.  Cardiovascular: Normal rate, regular rhythm, normal heart sounds and normal pulses.   Pulmonary/Chest: Effort normal. No respiratory distress. She has no wheezes. Right breast exhibits inverted nipple. Right breast exhibits no mass, no nipple discharge, no skin change and no tenderness. Left breast exhibits inverted nipple. Left breast exhibits no mass, no nipple discharge, no skin change and no tenderness.  Abdominal: Soft. Bowel sounds are normal. There is no hepatosplenomegaly. There is no tenderness. There is no CVA tenderness.  Musculoskeletal: Normal range of motion.  Lymphadenopathy:    She has no cervical adenopathy.    She has no axillary adenopathy.  Neurological: She is alert and oriented to person, place, and time. She has normal reflexes. No cranial nerve deficit or sensory deficit.  Skin: Skin is warm, dry and intact. No rash noted.  Psychiatric: She has a normal mood and affect. Her speech is normal and behavior is normal. Thought content normal.  Nursing note and vitals reviewed.   Activities of Daily Living In your present state of  health, do you have any difficulty performing the following activities: 01/01/2015  Hearing? Y  Vision? N  Difficulty concentrating or making decisions? N  Walking or climbing stairs? N  Dressing or bathing? N  Doing errands, shopping? N    Fall Risk Assessment Fall Risk  01/01/2015  Falls in the past year? No     Patient reports there are safety devices in place in shower at home.   Depression Screen PHQ 2/9 Scores 01/01/2015  PHQ - 2 Score 0    Cognitive Testing - 6-CIT   Correct? Score   What year is it? yes 0 Yes = 0    No = 4  What month is it? yes 0 Yes = 0    No = 3  Remember:     Pia [address], [place], [place]     What time is it? yes 0 Yes = 0    No = 3  Count backwards from 20 to 1  yes 0 Correct = 0    1 error = 2   More than 1 error = 4  Say the months of the year in reverse. yes 0 Correct = 0    1 error = 2   More than 1 error = 4  What address did I ask you to remember? no 2 Correct = 0  1 error = 2    2 error = 4    3 error = 6    4 error = 8    All wrong = 10       TOTAL SCORE  2/28   Interpretation:  Normal  Normal (0-7) Abnormal (8-28)        Assessment & Plan:     Annual Wellness Visit  Reviewed patient's Family Medical History Reviewed and updated list of patient's medical providers Assessment of cognitive impairment was done Assessed patient's functional ability Established a written schedule for health screening [place] Completed and Reviewed  Exercise Activities and Dietary recommendations Goals    None      Immunization History  Administered Date(s) Administered  . Influenza-Unspecified 04/27/2015  . Pneumococcal Conjugate-13 06/29/2014  . Pneumococcal Polysaccharide-23 10/30/2008  . Td 10/30/2008  . Zoster 10/30/2008    Health Maintenance  Topic Date Due  . INFLUENZA VACCINE  02/19/2016  . TETANUS/TDAP  10/31/2018  . DEXA SCAN  Completed  . ZOSTAVAX  Completed  . PNA vac Low Risk Adult  Completed     Discussed health benefits of physical activity, and encouraged her to engage in regular exercise appropriate for her age and condition.    ------------------------------------------------------------------------------------------------------------  1. Benign hypertension Controlled on current medication - POC urinalysis w microscopic (non auto)  2. Gastroesophageal reflux disease, esophagitis presence not specified Continue omeprazole and Zantac Follow-up with ENT to discuss long-term dosing - CBC with Differential/Platelet  3. CD (celiac disease) Stable on gluten-free diet - Comprehensive metabolic panel  4. Acquired hypothyroidism Supplemented; will adjust dose if needed - TSH  5.  Dyslipidemia On statin therapy - Lipid panel  6. Prediabetes A1c not covered by insurance; continue low carb diet Monitor using fasting blood sugars for now  7. Urinary tract infection without hematuria, site unspecified - nitrofurantoin, macrocrystal-monohydrate, (MACROBID) 100 MG capsule; Take 1 capsule (100 mg total) by mouth 2 (two) times daily.  Dispense: 14 capsule; Refill: 0   Halina Maidens, MD [facility]  07/03/2015

## 2015-07-03 NOTE — Patient Instructions (Signed)
Health Maintenance  Topic Date Due  . INFLUENZA VACCINE  02/19/2016  . TETANUS/TDAP  10/31/2018  . DEXA SCAN  Completed  . ZOSTAVAX  Completed  . PNA vac Low Risk Adult  Completed   Breast Self-Awareness Practicing breast self-awareness may pick up problems early, prevent significant medical complications, and possibly save your life. By practicing breast self-awareness, you can become familiar with how your breasts look and feel and if your breasts are changing. This allows you to notice changes early. It can also offer you some reassurance that your breast health is good. One way to learn what is normal for your breasts and whether your breasts are changing is to do a breast self-exam. If you find a lump or something that was not present in the past, it is best to contact your caregiver right away. Other findings that should be evaluated by your caregiver include nipple discharge, especially if it is bloody; skin changes or reddening; areas where the skin seems to be pulled in (retracted); or new lumps and bumps. Breast pain is seldom associated with cancer (malignancy), but should also be evaluated by a caregiver. HOW TO PERFORM A BREAST SELF-EXAM The best time to examine your breasts is 5-7 days after your menstrual period is over. During menstruation, the breasts are lumpier, and it may be more difficult to pick up changes. If you do not menstruate, have reached menopause, or had your uterus removed (hysterectomy), you should examine your breasts at regular intervals, such as monthly. If you are breastfeeding, examine your breasts after a feeding or after using a breast pump. Breast implants do not decrease the risk for lumps or tumors, so continue to perform breast self-exams as recommended. Talk to your caregiver about how to determine the difference between the implant and breast tissue. Also, talk about the amount of pressure you should use during the exam. Over time, you will become more  familiar with the variations of your breasts and more comfortable with the exam. A breast self-exam requires you to remove all your clothes above the waist. 1. Look at your breasts and nipples. Stand in front of a mirror in a room with good lighting. With your hands on your hips, push your hands firmly downward. Look for a difference in shape, contour, and size from one breast to the other (asymmetry). Asymmetry includes puckers, dips, or bumps. Also, look for skin changes, such as reddened or scaly areas on the breasts. Look for nipple changes, such as discharge, dimpling, repositioning, or redness. 2. Carefully feel your breasts. This is best done either in the shower or tub while using soapy water or when flat on your back. Place the arm (on the side of the breast you are examining) above your head. Use the pads (not the fingertips) of your three middle fingers on your opposite hand to feel your breasts. Start in the underarm area and use  inch (2 cm) overlapping circles to feel your breast. Use 3 different levels of pressure (light, medium, and firm pressure) at each circle before moving to the next circle. The light pressure is needed to feel the tissue closest to the skin. The medium pressure will help to feel breast tissue a little deeper, while the firm pressure is needed to feel the tissue close to the ribs. Continue the overlapping circles, moving downward over the breast until you feel your ribs below your breast. Then, move one finger-width towards the center of the body. Continue to use the  inch (2 cm) overlapping circles to feel your breast as you move slowly up toward the collar bone (clavicle) near the base of the neck. Continue the up and down exam using all 3 pressures until you reach the middle of the chest. Do this with each breast, carefully feeling for lumps or changes. 3.  Keep a written record with breast changes or normal findings for each breast. By writing this information down, you  do not need to depend only on memory for size, tenderness, or location. Write down where you are in your menstrual cycle, if you are still menstruating. Breast tissue can have some lumps or thick tissue. However, see your caregiver if you find anything that concerns you.  SEEK MEDICAL CARE IF:  You see a change in shape, contour, or size of your breasts or nipples.   You see skin changes, such as reddened or scaly areas on the breasts or nipples.   You have an unusual discharge from your nipples.   You feel a new lump or unusually thick areas.    This information is not intended to replace advice given to you by your health care provider. Make sure you discuss any questions you have with your health care provider.   Document Released: 07/07/2005 Document Revised: 06/23/2012 Document Reviewed: 10/22/2011 Elsevier Interactive Patient Education [employer].

## 2015-07-04 ENCOUNTER — Ambulatory Visit
Admission: RE | Admit: 2015-07-04 | Discharge: 2015-07-04 | Disposition: A | Discharge status: Home / Self Care | Payer: Medicare Other | Source: Ambulatory Visit | Attending: Internal Medicine | Admitting: Internal Medicine

## 2015-07-04 DIAGNOSIS — Z1231 Encounter for screening mammogram for malignant neoplasm of breast: Secondary | ICD-10-CM | POA: Diagnosis not present

## 2015-07-04 DIAGNOSIS — Z1239 Encounter for other screening for malignant neoplasm of breast: Secondary | ICD-10-CM

## 2015-07-04 HISTORY — DX: Malignant (primary) neoplasm, unspecified: C80.1

## 2015-07-04 LAB — COMPREHENSIVE METABOLIC PANEL
A/G RATIO: 1.8 (ref 1.1–2.5)
ALT: 22 IU/L (ref 0–32)
AST: 19 IU/L (ref 0–40)
Albumin: 4.3 g/dL (ref 3.5–4.8)
Alkaline Phosphatase: 33 IU/L — ABNORMAL LOW (ref 39–117)
BUN/Creatinine Ratio: 19 (ref 11–26)
BUN: 14 mg/dL (ref 8–27)
Bilirubin Total: 0.5 mg/dL (ref 0.0–1.2)
CO2: 27 mmol/L (ref 18–29)
Calcium: 9.2 mg/dL (ref 8.7–10.3)
Chloride: 99 mmol/L (ref 96–106)
Creatinine, Ser: 0.72 mg/dL (ref 0.57–1.00)
GFR calc Af Amer: 93 mL/min/{1.73_m2} (ref >59)
GFR, EST NON AFRICAN AMERICAN: 80 mL/min/{1.73_m2} (ref >59)
GLOBULIN, TOTAL: 2.4 g/dL (ref 1.5–4.5)
Glucose: 96 mg/dL (ref 65–99)
POTASSIUM: 4.5 mmol/L (ref 3.5–5.2)
Sodium: 142 mmol/L (ref 134–144)
Total Protein: 6.7 g/dL (ref 6.0–8.5)

## 2015-07-04 LAB — CBC WITH DIFFERENTIAL/PLATELET
BASOS: 0 %
Basophils Absolute: 0 10*3/uL (ref 0.0–0.2)
EOS (ABSOLUTE): 0.1 10*3/uL (ref 0.0–0.4)
EOS: 2 %
HEMATOCRIT: 42.5 % (ref 34.0–46.6)
Hemoglobin: 14 g/dL (ref 11.1–15.9)
IMMATURE GRANULOCYTES: 0 %
Immature Grans (Abs): 0 10*3/uL (ref 0.0–0.1)
LYMPHS ABS: 1.5 10*3/uL (ref 0.7–3.1)
Lymphs: 30 %
MCH: 29.5 pg (ref 26.6–33.0)
MCHC: 32.9 g/dL (ref 31.5–35.7)
MCV: 90 fL (ref 79–97)
MONOS ABS: 0.4 10*3/uL (ref 0.1–0.9)
Monocytes: 8 %
NEUTROS ABS: 2.9 10*3/uL (ref 1.4–7.0)
Neutrophils: 60 %
Platelets: 190 10*3/uL (ref 150–379)
RBC: 4.74 x10E6/uL (ref 3.77–5.28)
RDW: 14.1 % (ref 12.3–15.4)
WBC: 4.9 10*3/uL (ref 3.4–10.8)

## 2015-07-04 LAB — LIPID PANEL
CHOL/HDL RATIO: 2.4 ratio (ref 0.0–4.4)
Cholesterol, Total: 186 mg/dL (ref 100–199)
HDL: 78 mg/dL (ref >39)
LDL Calculated: 94 mg/dL (ref 0–99)
TRIGLYCERIDES: 72 mg/dL (ref 0–149)
VLDL Cholesterol Cal: 14 mg/dL (ref 5–40)

## 2015-07-04 LAB — TSH: TSH: 2.42 u[IU]/mL (ref 0.450–4.500)

## 2015-11-20 ENCOUNTER — Other Ambulatory Visit: Payer: Self-pay | Admitting: Internal Medicine

## 2015-11-21 ENCOUNTER — Encounter: Payer: Self-pay | Admitting: Internal Medicine

## 2015-11-23 ENCOUNTER — Encounter: Payer: Self-pay | Admitting: Internal Medicine

## 2015-11-28 ENCOUNTER — Other Ambulatory Visit: Payer: Self-pay | Admitting: Internal Medicine

## 2015-11-28 MED ORDER — LEVOTHYROXINE SODIUM 50 MCG PO TABS: 50.0000 ug | ORAL_TABLET | Freq: Every day | ORAL | Status: DC

## 2015-11-28 MED ORDER — LISINOPRIL-HYDROCHLOROTHIAZIDE 10-12.5 MG PO TABS: 1.0000 | ORAL_TABLET | Freq: Every day | ORAL | Status: DC

## 2015-12-03 ENCOUNTER — Other Ambulatory Visit: Payer: Self-pay | Admitting: Internal Medicine

## 2015-12-04 ENCOUNTER — Ambulatory Visit (INDEPENDENT_AMBULATORY_CARE_PROVIDER_SITE_OTHER): Payer: Medicare Other | Admitting: Internal Medicine

## 2015-12-04 ENCOUNTER — Encounter: Payer: Self-pay | Admitting: Internal Medicine

## 2015-12-04 VITALS — BP 128/70 | HR 70 | Resp 14 | Ht 65.0 in | Wt 167.8 lb

## 2015-12-04 DIAGNOSIS — I10 Essential (primary) hypertension: Secondary | ICD-10-CM

## 2015-12-04 DIAGNOSIS — K900 Celiac disease: Secondary | ICD-10-CM

## 2015-12-04 MED ORDER — ALENDRONATE SODIUM 70 MG PO TABS: 70.0000 mg | ORAL_TABLET | ORAL | Status: DC

## 2015-12-04 MED ORDER — OMEPRAZOLE 40 MG PO CPDR: 40.0000 mg | DELAYED_RELEASE_CAPSULE | Freq: Every day | ORAL | Status: DC

## 2015-12-04 MED ORDER — AZELASTINE HCL 0.1 % NA SOLN: 2.0000 | Freq: Two times a day (BID) | NASAL | Status: DC

## 2015-12-04 NOTE — Progress Notes (Signed)
Date:  12/04/2015   Name:  Jill Dunlap   DOB:  [birth year]   MRN:  [id number]   Chief Complaint: Diarrhea Diarrhea  This is a new problem. The current episode started more than 1 month ago. The problem occurs less than 2 times per day. The problem has been waxing and waning. The patient states that diarrhea does not awaken her from sleep. Pertinent negatives include no abdominal pain, bloating, chills, fever, headaches or vomiting. She has tried nothing for the symptoms.  Hypertension This is a chronic problem. The current episode started more than 1 year ago. The problem is unchanged. The problem is controlled. Pertinent negatives include no chest pain, headaches, palpitations or shortness of breath.  She has celiac disease diagnosed 6 years ago. She's been following a very good diet. The diarrhea started after she returned from a 3 month stay in [place]. She had about a month of firm stool first thing in the morning followed by loose mushy stools throughout the day. This was not associated with any change in diet medications or activities. No exposures to well water. Over the past several weeks she's had several days in a row of firm stools or no stool followed by several days of again soft mushy stools. There has been no blood or mucus in the stool no abdominal pain no fever. Her appetite is normal. She's actually gained a few pounds. She was previously seen in [place] by Dr. Elwanda Brooklyn who has since retired.    Review of Systems  Constitutional: Negative for fever, chills, fatigue and unexpected weight change.  Respiratory: Negative for chest tightness and shortness of breath.   Cardiovascular: Negative for chest pain, palpitations and leg swelling.  Gastrointestinal: Positive for diarrhea. Negative for nausea, vomiting, abdominal pain, blood in stool and bloating.  Skin: Negative for rash.  Neurological: Negative for dizziness and headaches.    Patient Active Problem  List   Diagnosis Date Noted  . Esophageal reflux 07/03/2015  . Prediabetes 03/15/2015  . Sprain of rotator cuff capsule 01/11/2015  . Acquired hypothyroidism 10/31/2014  . Allergic rhinitis 10/31/2014  . Benign hypertension 10/31/2014  . CD (celiac disease) 10/31/2014  . Dyslipidemia 10/31/2014  . OP (osteoporosis) 10/31/2014    Prior to Admission medications   Medication Sig Start Date End Date Taking? Authorizing Provider  alendronate (FOSAMAX) 70 MG tablet Take 1 tablet by mouth once a week. 01/05/14  Yes Historical Provider, MD  Biotin 2500 MCG CAPS Take 1 capsule by mouth daily.   Yes Historical Provider, MD  Calcium-Vitamin D-Vitamin K [phone] MG-UNT-MCG CHEW Chew 1 tablet by mouth daily.   Yes Historical Provider, MD  fexofenadine (ALLEGRA) 180 MG tablet Take 1 tablet by mouth daily.   Yes Historical Provider, MD  fluticasone (FLONASE) 50 MCG/ACT nasal spray Place 2 sprays into both nostrils daily. 11/29/13  Yes Historical Provider, MD  levothyroxine (SYNTHROID, LEVOTHROID) 50 MCG tablet Take 1 tablet (50 mcg total) by mouth daily. 11/28/15  Yes Glean Hess, MD  lisinopril-hydrochlorothiazide (PRINZIDE,ZESTORETIC) 10-12.5 MG tablet Take 1 tablet by mouth daily. 11/28/15  Yes Glean Hess, MD  Omega-3 Fatty Acids (FISH OIL) 1000 MG CPDR Take 1 capsule by mouth daily.   Yes Historical Provider, MD  omeprazole (PRILOSEC) 40 MG capsule Take 1 capsule by mouth daily. 05/30/15  Yes Historical Provider, MD  ranitidine (ZANTAC) 75 MG tablet Take 75 mg by mouth 2 (two) times daily.    Historical Provider, MD  simvastatin (  ZOCOR) 20 MG tablet Take 1 tablet by mouth at bedtime. 06/29/14   Historical Provider, MD    Allergies  Allergen Reactions  . Ciprofloxacin Hcl Rash    Past Surgical History  Procedure Laterality Date  . Orif wrist fracture Left 2015  . Abdominal hysterectomy  1984    for bleeding  . Bilateral salpingoophorectomy  1984  . Tympanoplasty  1992    Social  History  Substance Use Topics  . Smoking status: Never Smoker   . Smokeless tobacco: None  . Alcohol Use: No     Medication list has been reviewed and updated.   Physical Exam  Constitutional: She is oriented to person, place, and time. She appears well-developed and well-nourished.  Cardiovascular: Normal rate, regular rhythm and normal heart sounds.   Pulmonary/Chest: Effort normal and breath sounds normal.  Abdominal: Soft. Normal appearance. Bowel sounds are decreased. There is no hepatosplenomegaly. There is no tenderness. There is no CVA tenderness.  Musculoskeletal: She exhibits no edema.  Neurological: She is alert and oriented to person, place, and time.  Psychiatric: She has a normal mood and affect.  Nursing note and vitals reviewed.   BP 128/70 mmHg  Pulse 70  Resp 14  Wt 167 lb 12.8 oz (76.114 kg)  Assessment and Plan: 1. CD (celiac disease) Begin Probiotic supplements Continue same diet - CBC with Differential/Platelet - Comprehensive metabolic panel - Ambulatory referral to Gastroenterology  2. Benign hypertension controlled   Halina Maidens, MD [facility]  12/04/2015

## 2015-12-05 LAB — CBC WITH DIFFERENTIAL/PLATELET
BASOS ABS: 0 10*3/uL (ref 0.0–0.2)
BASOS: 1 %
EOS (ABSOLUTE): 0.5 10*3/uL — ABNORMAL HIGH (ref 0.0–0.4)
Eos: 9 %
HEMATOCRIT: 43.2 % (ref 34.0–46.6)
HEMOGLOBIN: 14 g/dL (ref 11.1–15.9)
IMMATURE GRANS (ABS): 0 10*3/uL (ref 0.0–0.1)
IMMATURE GRANULOCYTES: 0 %
LYMPHS: 28 %
Lymphocytes Absolute: 1.7 10*3/uL (ref 0.7–3.1)
MCH: 29.4 pg (ref 26.6–33.0)
MCHC: 32.4 g/dL (ref 31.5–35.7)
MCV: 91 fL (ref 79–97)
MONOCYTES: 10 %
Monocytes Absolute: 0.6 10*3/uL (ref 0.1–0.9)
NEUTROS ABS: 3.2 10*3/uL (ref 1.4–7.0)
Neutrophils: 52 %
Platelets: 166 10*3/uL (ref 150–379)
RBC: 4.76 x10E6/uL (ref 3.77–5.28)
RDW: 13.9 % (ref 12.3–15.4)
WBC: 6.1 10*3/uL (ref 3.4–10.8)

## 2015-12-05 LAB — COMPREHENSIVE METABOLIC PANEL
ALBUMIN: 4.2 g/dL (ref 3.5–4.8)
ALK PHOS: 31 IU/L — AB (ref 39–117)
ALT: 30 IU/L (ref 0–32)
AST: 27 IU/L (ref 0–40)
Albumin/Globulin Ratio: 1.8 (ref 1.2–2.2)
BUN / CREAT RATIO: 19 (ref 12–28)
BUN: 15 mg/dL (ref 8–27)
Bilirubin Total: 0.3 mg/dL (ref 0.0–1.2)
CALCIUM: 9.1 mg/dL (ref 8.7–10.3)
CO2: 22 mmol/L (ref 18–29)
CREATININE: 0.77 mg/dL (ref 0.57–1.00)
Chloride: 101 mmol/L (ref 96–106)
GFR calc Af Amer: 86 mL/min/{1.73_m2} (ref >59)
GFR, EST NON AFRICAN AMERICAN: 74 mL/min/{1.73_m2} (ref >59)
GLOBULIN, TOTAL: 2.4 g/dL (ref 1.5–4.5)
GLUCOSE: 117 mg/dL — AB (ref 65–99)
Potassium: 4.1 mmol/L (ref 3.5–5.2)
SODIUM: 142 mmol/L (ref 134–144)
TOTAL PROTEIN: 6.6 g/dL (ref 6.0–8.5)

## 2015-12-31 ENCOUNTER — Encounter: Payer: Self-pay | Admitting: Gastroenterology

## 2015-12-31 ENCOUNTER — Ambulatory Visit (INDEPENDENT_AMBULATORY_CARE_PROVIDER_SITE_OTHER): Payer: Medicare Other | Admitting: Gastroenterology

## 2015-12-31 VITALS — BP 160/74 | HR 86 | Temp 98.4°F | Ht 66.0 in | Wt 167.0 lb

## 2015-12-31 DIAGNOSIS — K900 Celiac disease: Secondary | ICD-10-CM

## 2015-12-31 NOTE — Progress Notes (Signed)
Gastroenterology Consultation  Referring Provider:     Glean Hess, MD Primary Care Physician:  Halina Maidens, MD Primary Gastroenterologist:  Dr. Allen Norris     Reason for Consultation:     Celiac sprue        HPI:   Jill Dunlap is a 81 y.o. y/o female referred for consultation & management of Celiac sprue  by Dr. Halina Maidens, MD.  This patient comes in today with a history of celiac sprue.  The patient states that she was diagnosed many years ago in [place] after being found to have anemia.  The patient also reports that she has been doing well on her gluten-free diet until she had diarrhea that lasted for almost 3 weeks.  The patient states that she did not intentionally go off of her diet and does not know why she had the diarrhea.  The patient also reports that she is back to her normal self at the present time without any diarrhea or abdominal pain.  The patient states that she is usually constipated.  There is no report of any black stools or bloody stools.  She also denies any bloating or unexplained weight loss.  She states that she has been adherent to her diet but still uses the same toast are as her husband does.  Past Medical History  Diagnosis Date  . Cancer ([place])     skin ca on leg    Past Surgical History  Procedure Laterality Date  . Orif wrist fracture Left 2015  . Abdominal hysterectomy  1984    for bleeding  . Bilateral salpingoophorectomy  1984  . Tympanoplasty  1992    Prior to Admission medications   Medication Sig Start Date End Date Taking? Authorizing Provider  alendronate (FOSAMAX) 70 MG tablet Take 1 tablet (70 mg total) by mouth once a week. 12/04/15  Yes Glean Hess, MD  azelastine (ASTELIN) 0.1 % nasal spray Place 2 sprays into both nostrils 2 (two) times daily. Use in each nostril as directed 12/04/15  Yes Glean Hess, MD  Biotin 2500 MCG CAPS Take 1 capsule by mouth daily.   Yes Historical Provider, MD  Calcium-Vitamin D-Vitamin  K [phone] MG-UNT-MCG CHEW Chew 1 tablet by mouth daily.   Yes Historical Provider, MD  fexofenadine (ALLEGRA) 180 MG tablet Take 1 tablet by mouth daily.   Yes Historical Provider, MD  fluticasone (FLONASE) 50 MCG/ACT nasal spray Place 2 sprays into both nostrils daily. 11/29/13  Yes Historical Provider, MD  levothyroxine (SYNTHROID, LEVOTHROID) 50 MCG tablet Take 1 tablet (50 mcg total) by mouth daily. 11/28/15  Yes Glean Hess, MD  lisinopril-hydrochlorothiazide (PRINZIDE,ZESTORETIC) 10-12.5 MG tablet Take 1 tablet by mouth daily. 11/28/15  Yes Glean Hess, MD  Omega-3 Fatty Acids (FISH OIL) 1000 MG CPDR Take 1 capsule by mouth daily.   Yes Historical Provider, MD  omeprazole (PRILOSEC) 40 MG capsule Take 1 capsule (40 mg total) by mouth daily. 12/04/15  Yes Glean Hess, MD  simvastatin (ZOCOR) 20 MG tablet Take 1 tablet by mouth at bedtime. 06/29/14  Yes Historical Provider, MD  ranitidine (ZANTAC) 75 MG tablet Take 75 mg by mouth 2 (two) times daily. Reported on 12/31/2015    Historical Provider, MD    Family History  Problem Relation Age of Onset  . Cancer Mother   . Hypertension Father   . Heart disease Father      Social History  Substance Use Topics  .  Smoking status: Never Smoker   . Smokeless tobacco: None  . Alcohol Use: No    Allergies as of 12/31/2015 - Review Complete 12/31/2015  Allergen Reaction Noted  . Ciprofloxacin hcl Rash 03/05/2015    Review of Systems:    All systems reviewed and negative except where noted in HPI.   Physical Exam:  BP 160/74 mmHg  Pulse 86  Temp(Src) 98.4 F (36.9 C) (Oral)  Ht 5' 6"  (1.676 m)  Wt 167 lb (75.751 kg)  BMI 26.97 kg/m2 No LMP recorded. Patient is postmenopausal. Psych:  Alert and cooperative. Normal mood and affect. General:   Alert,  Well-developed, well-nourished, pleasant and cooperative in NAD Head:  Normocephalic and atraumatic. Eyes:  Sclera clear, no icterus.   Conjunctiva pink. Ears:  Normal  auditory acuity. Nose:  No deformity, discharge, or lesions. Mouth:  No deformity or lesions,oropharynx pink & moist. Neck:  Supple; no masses or thyromegaly. Lungs:  Respirations even and unlabored.  Clear throughout to auscultation.   No wheezes, crackles, or rhonchi. No acute distress. Heart:  Regular rate and rhythm; no murmurs, clicks, rubs, or gallops. Abdomen:  Normal bowel sounds.  No bruits.  Soft, non-tender and non-distended without masses, hepatosplenomegaly or hernias noted.  No guarding or rebound tenderness.  Negative Carnett sign.   Rectal:  Deferred.  Msk:  Symmetrical without gross deformities.  Good, equal movement & strength bilaterally. Pulses:  Normal pulses noted. Extremities:  No clubbing or edema.  No cyanosis. Neurologic:  Alert and oriented x3;  grossly normal neurologically. Skin:  Intact without significant lesions or rashes.  No jaundice. Lymph Nodes:  No significant cervical adenopathy. Psych:  Alert and cooperative. Normal mood and affect.  Imaging Studies: No results found.  Assessment and Plan:   Jill Dunlap is a 81 y.o. y/o female who comes in today with a history of celiac sprue.  The patient has been doing well with her diet but did have a bout of diarrhea that lasted approximately 3 weeks.  The patient states that she has no further diarrhea and has been doing well.  There is no worrisome symptoms such as weight loss or abdominal bloating.  She also is not having any black or bloody stools.  The patient has been told to continue doing what she is doing and contact me if any of her return. The patient has been explained the plan and agrees with it.   Note: This dictation was prepared with Dragon dictation along with smaller phrase technology. Any transcriptional errors that result from this process are unintentional.

## 2016-01-11 DIAGNOSIS — C4491 Basal cell carcinoma of skin, unspecified: Secondary | ICD-10-CM | POA: Insufficient documentation

## 2016-01-30 LAB — HM DIABETES EYE EXAM

## 2016-02-04 ENCOUNTER — Encounter: Payer: Self-pay | Admitting: Internal Medicine

## 2016-02-13 DIAGNOSIS — L578 Other skin changes due to chronic exposure to nonionizing radiation: Secondary | ICD-10-CM | POA: Insufficient documentation

## 2016-02-25 ENCOUNTER — Other Ambulatory Visit: Payer: Self-pay | Admitting: Internal Medicine

## 2016-02-25 ENCOUNTER — Encounter: Payer: Self-pay | Admitting: Internal Medicine

## 2016-02-25 MED ORDER — LEVOTHYROXINE SODIUM 50 MCG PO TABS: 50.0000 ug | ORAL_TABLET | Freq: Every day | ORAL | 3 refills | Status: DC

## 2016-02-25 MED ORDER — LISINOPRIL-HYDROCHLOROTHIAZIDE 10-12.5 MG PO TABS: 1.0000 | ORAL_TABLET | Freq: Every day | ORAL | 3 refills | Status: DC

## 2016-03-04 ENCOUNTER — Telehealth: Payer: Self-pay

## 2016-03-04 NOTE — Telephone Encounter (Signed)
Patient called and I advised her OV and Dr. Carolin Coy sent message to Ginger at [facility] office.

## 2016-03-04 NOTE — Telephone Encounter (Signed)
Dr. Army Melia,  I moved her up to 03/17/16. If you feel like she needs an appt next week let me know. I will have to speak with Dr. Allen Norris to let me know which day would be best as he is overbooked all next week.   Thanks!  Anarely Nicholls

## 2016-03-04 NOTE — Telephone Encounter (Signed)
-----   Message from Glean Hess, MD sent at 03/04/2016 12:19 PM EDT ----- Cung Masterson,    Dr. Allen Norris and I have a mutual pt with Celiac.  She was seen by him in June after 3 weeks of diarrhea.  By the time she was seen it has resolved.  He told her to contact him if the sx returned.  She reports several weeks of diarrhea. She called your office and was told she could not be seen until September.    Can you PLEASE work her in?    Thanks, Dr. Army Melia

## 2016-03-07 ENCOUNTER — Encounter: Payer: Self-pay | Admitting: Internal Medicine

## 2016-03-07 ENCOUNTER — Ambulatory Visit (INDEPENDENT_AMBULATORY_CARE_PROVIDER_SITE_OTHER): Payer: Medicare Other | Admitting: Internal Medicine

## 2016-03-07 ENCOUNTER — Other Ambulatory Visit: Payer: Self-pay

## 2016-03-07 VITALS — BP 102/78 | HR 65 | Temp 98.2°F | Resp 16 | Ht 65.5 in | Wt 164.0 lb

## 2016-03-07 DIAGNOSIS — K900 Celiac disease: Secondary | ICD-10-CM

## 2016-03-07 DIAGNOSIS — I10 Essential (primary) hypertension: Secondary | ICD-10-CM

## 2016-03-07 NOTE — Progress Notes (Signed)
Date:  03/07/2016   Name:  Jill Dunlap   DOB:  May 30, 1935   MRN:  [id number]   Chief Complaint: Diarrhea (Will see Dr. Allen Norris 28th) Diarrhea   This is a recurrent problem. The problem occurs 2 to 4 times per day. The problem has been waxing and waning. The stool consistency is described as watery. Pertinent negatives include no abdominal pain, arthralgias, bloating, chills, fever, headaches or vomiting. Treatments tried: changed to a refridgerated probiotic. celiac  Hypertension  This is a chronic problem. The current episode started more than 1 year ago. The problem is unchanged. The problem is controlled. Pertinent negatives include no chest pain, headaches, palpitations or shortness of breath.    Review of Systems  Constitutional: Negative for chills, fatigue, fever and unexpected weight change.  Eyes: Negative for visual disturbance.  Respiratory: Negative for chest tightness, shortness of breath and stridor.   Cardiovascular: Negative for chest pain, palpitations and leg swelling.  Gastrointestinal: Positive for diarrhea. Negative for abdominal pain, bloating, nausea and vomiting.  Endocrine: Negative for polydipsia and polyuria.  Genitourinary: Negative for dysuria.  Musculoskeletal: Negative for arthralgias.  Skin: Negative for rash.  Neurological: Negative for light-headedness and headaches.  Psychiatric/Behavioral: Negative for sleep disturbance.    Patient Active Problem List   Diagnosis Date Noted  . Diffuse photodamage of skin 02/13/2016  . Basal cell carcinoma 01/11/2016  . Esophageal reflux 07/03/2015  . Prediabetes 03/15/2015  . Sprain of rotator cuff capsule 01/11/2015  . Acquired hypothyroidism 10/31/2014  . Allergic rhinitis 10/31/2014  . Benign hypertension 10/31/2014  . CD (celiac disease) 10/31/2014  . Dyslipidemia 10/31/2014  . OP (osteoporosis) 10/31/2014    Prior to Admission medications   Medication Sig Start Date End Date Taking?  Authorizing Provider  azelastine (ASTELIN) 0.1 % nasal spray Place 2 sprays into both nostrils 2 (two) times daily. Use in each nostril as directed 12/04/15  Yes Glean Hess, MD  Biotin 2500 MCG CAPS Take 1 capsule by mouth daily.   Yes Historical Provider, MD  Calcium-Vitamin D-Vitamin K [phone] MG-UNT-MCG CHEW Chew 1 tablet by mouth daily.   Yes Historical Provider, MD  fexofenadine (ALLEGRA) 180 MG tablet Take 1 tablet by mouth daily.   Yes Historical Provider, MD  levothyroxine (SYNTHROID, LEVOTHROID) 50 MCG tablet Take 1 tablet (50 mcg total) by mouth daily. 02/25/16  Yes Glean Hess, MD  lisinopril-hydrochlorothiazide (PRINZIDE,ZESTORETIC) 10-12.5 MG tablet Take 1 tablet by mouth daily. 02/25/16  Yes Glean Hess, MD  Omega-3 Fatty Acids (FISH OIL) 1000 MG CPDR Take 1 capsule by mouth daily.   Yes Historical Provider, MD  omeprazole (PRILOSEC) 40 MG capsule Take 1 capsule (40 mg total) by mouth daily. 12/04/15  Yes Glean Hess, MD  Probiotic Product (PROBIOTIC ADVANCED PO) Take by mouth.   Yes Historical Provider, MD  simvastatin (ZOCOR) 20 MG tablet Take 1 tablet by mouth at bedtime. 06/29/14  Yes Historical Provider, MD  alendronate (FOSAMAX) 70 MG tablet Take 1 tablet (70 mg total) by mouth once a week. Patient not taking: Reported on 03/07/2016 12/04/15   Glean Hess, MD    Allergies  Allergen Reactions  . Ciprofloxacin Hcl Rash    Past Surgical History:  Procedure Laterality Date  . ABDOMINAL HYSTERECTOMY  1984   for bleeding  . Basil cell    . BILATERAL SALPINGOOPHORECTOMY  1984  . ORIF WRIST FRACTURE Left 2015  . TYMPANOPLASTY  1992    Social History  Substance Use Topics  . Smoking status: Former Smoker    Types: Cigarettes  . Smokeless tobacco: Never Used  . Alcohol use No     Medication list has been reviewed and updated.   Physical Exam  Constitutional: She is oriented to person, place, and time. She appears well-developed. No distress.    HENT:  Head: Normocephalic and atraumatic.  Eyes: Conjunctivae are normal. Pupils are equal, round, and reactive to light.  Neck: Normal range of motion.  Cardiovascular: Normal rate, regular rhythm and normal heart sounds.   Pulmonary/Chest: Effort normal and breath sounds normal. No respiratory distress.  Abdominal: Soft. Bowel sounds are normal. She exhibits no distension and no mass. There is no tenderness. There is no rebound and no guarding.  Musculoskeletal: She exhibits no edema.  Neurological: She is alert and oriented to person, place, and time.  Skin: Skin is warm and dry. No rash noted.  Psychiatric: She has a normal mood and affect. Her behavior is normal. Thought content normal.  Nursing note and vitals reviewed.   BP 102/78 (BP Location: Right Arm, Patient Position: Sitting, Cuff Size: Normal)   Pulse 65   Temp 98.2 F (36.8 C)   Resp 16   Ht 5' 5.5" (1.664 m)   Wt 164 lb (74.4 kg)   SpO2 96%   BMI 26.88 kg/m   Assessment and Plan: 1. CD (celiac disease) Continue probiotics bid Follow up with Dr. Allen Norris - CBC with Differential/Platelet  2. Benign hypertension controlled   Halina Maidens, MD [facility]  03/07/2016

## 2016-03-08 LAB — CBC WITH DIFFERENTIAL/PLATELET
BASOS ABS: 0 10*3/uL (ref 0.0–0.2)
Basos: 0 %
EOS (ABSOLUTE): 0.2 10*3/uL (ref 0.0–0.4)
EOS: 3 %
HEMATOCRIT: 41.5 % (ref 34.0–46.6)
HEMOGLOBIN: 13.8 g/dL (ref 11.1–15.9)
IMMATURE GRANS (ABS): 0 10*3/uL (ref 0.0–0.1)
Immature Granulocytes: 0 %
LYMPHS: 25 %
Lymphocytes Absolute: 1.8 10*3/uL (ref 0.7–3.1)
MCH: 29.7 pg (ref 26.6–33.0)
MCHC: 33.3 g/dL (ref 31.5–35.7)
MCV: 89 fL (ref 79–97)
MONOCYTES: 10 %
Monocytes Absolute: 0.7 10*3/uL (ref 0.1–0.9)
NEUTROS ABS: 4.5 10*3/uL (ref 1.4–7.0)
Neutrophils: 62 %
Platelets: 201 10*3/uL (ref 150–379)
RBC: 4.64 x10E6/uL (ref 3.77–5.28)
RDW: 14 % (ref 12.3–15.4)
WBC: 7.2 10*3/uL (ref 3.4–10.8)

## 2016-03-17 ENCOUNTER — Encounter: Payer: Self-pay | Admitting: Gastroenterology

## 2016-03-17 ENCOUNTER — Ambulatory Visit (INDEPENDENT_AMBULATORY_CARE_PROVIDER_SITE_OTHER): Payer: Medicare Other | Admitting: Gastroenterology

## 2016-03-17 VITALS — BP 145/69 | HR 67 | Temp 98.3°F | Ht 65.0 in | Wt 168.0 lb

## 2016-03-17 DIAGNOSIS — K900 Celiac disease: Secondary | ICD-10-CM | POA: Diagnosis not present

## 2016-03-17 NOTE — Progress Notes (Signed)
Primary Care Physician: Halina Maidens, MD  Primary Gastroenterologist:  Dr. Lucilla Lame  Chief Complaint  Patient presents with  . Diarrhea    HPI: Jill Dunlap is a 81 y.o. female here for follow-up of her diarrhea. The patient reports that her diarrhea lasted 17 days.  The patient now reports that her stools are back to being solid.  The patient has a history of celiac sprue.  The patient denies any abdominal pain nausea vomiting fevers or chills at this time but was having severe abdominal pain at that time.  He does not know if she was exposed to any foods with gluten in it.  Current Outpatient Prescriptions  Medication Sig Dispense Refill  . azelastine (ASTELIN) 0.1 % nasal spray Place 2 sprays into both nostrils 2 (two) times daily. Use in each nostril as directed 30 mL 12  . Biotin 2500 MCG CAPS Take 1 capsule by mouth daily.    . Calcium-Vitamin D-Vitamin K [phone] MG-UNT-MCG CHEW Chew 1 tablet by mouth daily.    . fexofenadine (ALLEGRA) 180 MG tablet Take 1 tablet by mouth daily.    Marland Kitchen levothyroxine (SYNTHROID, LEVOTHROID) 50 MCG tablet Take 1 tablet (50 mcg total) by mouth daily. 90 tablet 3  . lisinopril-hydrochlorothiazide (PRINZIDE,ZESTORETIC) 10-12.5 MG tablet Take 1 tablet by mouth daily. 90 tablet 3  . Omega-3 Fatty Acids (FISH OIL) 1000 MG CPDR Take 1 capsule by mouth daily.    Marland Kitchen omeprazole (PRILOSEC) 40 MG capsule Take 1 capsule (40 mg total) by mouth daily. 90 capsule 3  . Probiotic Product (PROBIOTIC ADVANCED PO) Take by mouth.    . simvastatin (ZOCOR) 20 MG tablet Take 1 tablet by mouth at bedtime.    . TURMERIC PO Take by mouth.    Marland Kitchen alendronate (FOSAMAX) 70 MG tablet Take 1 tablet (70 mg total) by mouth once a week. (Patient not taking: Reported on 03/07/2016) 12 tablet 3   No current facility-administered medications for this visit.     Allergies as of 03/17/2016 - Review Complete 03/17/2016  Allergen Reaction Noted  . Ciprofloxacin hcl Rash  03/05/2015    ROS:  General: Negative for anorexia, weight loss, fever, chills, fatigue, weakness. ENT: Negative for hoarseness, difficulty swallowing , nasal congestion. CV: Negative for chest pain, angina, palpitations, dyspnea on exertion, peripheral edema.  Respiratory: Negative for dyspnea at rest, dyspnea on exertion, cough, sputum, wheezing.  GI: See history of present illness. GU:  Negative for dysuria, hematuria, urinary incontinence, urinary frequency, nocturnal urination.  Endo: Negative for unusual weight change.    Physical Examination:   BP (!) 145/69   Pulse 67   Temp 98.3 F (36.8 C) (Oral)   Ht 5' 5"  (1.651 m)   Wt 168 lb (76.2 kg)   BMI 27.96 kg/m   General: Well-nourished, well-developed in no acute distress.  Eyes: No icterus. Conjunctivae pink. Neuro: Alert and oriented x 3.  Grossly intact. Skin: Warm and dry, no jaundice.   Psych: Alert and cooperative, normal mood and affect.  Labs:    Imaging Studies: No results found.  Assessment and Plan:   Jill Dunlap is a 81 y.o. y/o female Who has a history of celiac sprue who had a 17 day stent of diarrhea.  The patient states her diarrhea has resolved and she is now having formed stools.  The patient has been told that she can take Imodium when she has these attacks.  The patient likely was exposed to inadvertent gluten ingestion.  The patient will take Imodium as needed when she has the diarrhea.  The patient has been explained the plan and agrees with it.   Note: This dictation was prepared with Dragon dictation along with smaller phrase technology. Any transcriptional errors that result from this process are unintentional.

## 2016-04-07 ENCOUNTER — Ambulatory Visit: Payer: Medicare Other | Admitting: Gastroenterology

## 2016-06-17 ENCOUNTER — Other Ambulatory Visit: Payer: Self-pay | Admitting: Internal Medicine

## 2016-06-17 DIAGNOSIS — Z1231 Encounter for screening mammogram for malignant neoplasm of breast: Secondary | ICD-10-CM

## 2016-07-07 ENCOUNTER — Ambulatory Visit
Admission: RE | Admit: 2016-07-07 | Discharge: 2016-07-07 | Disposition: A | Discharge status: Home / Self Care | Payer: Medicare Other | Source: Ambulatory Visit | Attending: Internal Medicine | Admitting: Internal Medicine

## 2016-07-07 DIAGNOSIS — Z1231 Encounter for screening mammogram for malignant neoplasm of breast: Secondary | ICD-10-CM | POA: Insufficient documentation

## 2016-10-29 ENCOUNTER — Ambulatory Visit (INDEPENDENT_AMBULATORY_CARE_PROVIDER_SITE_OTHER): Payer: Medicare Other | Admitting: Internal Medicine

## 2016-10-29 ENCOUNTER — Encounter: Payer: Self-pay | Admitting: Internal Medicine

## 2016-10-29 VITALS — BP 128/66 | HR 64 | Ht 65.5 in | Wt 155.6 lb

## 2016-10-29 DIAGNOSIS — K900 Celiac disease: Secondary | ICD-10-CM | POA: Diagnosis not present

## 2016-10-29 DIAGNOSIS — M818 Other osteoporosis without current pathological fracture: Secondary | ICD-10-CM

## 2016-10-29 DIAGNOSIS — I10 Essential (primary) hypertension: Secondary | ICD-10-CM

## 2016-10-29 DIAGNOSIS — E039 Hypothyroidism, unspecified: Secondary | ICD-10-CM

## 2016-10-29 DIAGNOSIS — Z0000 Encounter for general adult medical examination without abnormal findings: Secondary | ICD-10-CM | POA: Diagnosis not present

## 2016-10-29 DIAGNOSIS — K219 Gastro-esophageal reflux disease without esophagitis: Secondary | ICD-10-CM

## 2016-10-29 LAB — POCT URINALYSIS DIPSTICK
Bilirubin, UA: NEGATIVE
GLUCOSE UA: NEGATIVE
Ketones, UA: NEGATIVE
NITRITE UA: NEGATIVE
Protein, UA: NEGATIVE
SPEC GRAV UA: 1.003 (ref 1.010–1.025)
UROBILINOGEN UA: 0.2 U/dL
pH, UA: 5 (ref 5.0–8.0)

## 2016-10-29 MED ORDER — SIMVASTATIN 20 MG PO TABS: 20.0000 mg | ORAL_TABLET | Freq: Every day | ORAL | 3 refills | Status: DC

## 2016-10-29 MED ORDER — ALENDRONATE SODIUM 70 MG PO TABS
70.0000 mg | ORAL_TABLET | ORAL | 3 refills | Status: DC
Start: 2016-10-29 — End: 2018-03-24

## 2016-10-29 NOTE — Patient Instructions (Signed)
Health Maintenance  Topic Date Due  . INFLUENZA VACCINE  02/18/2017  . MAMMOGRAM  07/07/2017  . TETANUS/TDAP  10/31/2018  . DEXA SCAN  Completed  . PNA vac Low Risk Adult  Completed

## 2016-10-29 NOTE — Progress Notes (Signed)
Patient: Jill Dunlap, Female    DOB: [birth year], 82 y.o.   MRN: [id number] Visit Date: 10/29/2016  Today's Provider: Halina Maidens, MD   Chief Complaint  Patient presents with  . Medicare Wellness   Subjective:    Annual wellness visit Jill Dunlap is a 82 y.o. female who presents today for her Subsequent Annual Wellness Visit. She feels well. She reports exercising walking regularly. She reports she is sleeping fairly well.   ----------------------------------------------------------- Hypertension  Pertinent negatives include no chest pain, headaches, palpitations or shortness of breath. Identifiable causes of hypertension include a thyroid problem.  Thyroid Problem  Patient reports no anxiety, constipation, diarrhea, fatigue, palpitations or tremors.  Gastroesophageal Reflux  She complains of heartburn. She reports no abdominal pain, no chest pain, no coughing or no wheezing. Pertinent negatives include no fatigue. She has tried a PPI and a histamine-2 antagonist (stopped PPI and started Zantac with fairly good sx control) for the symptoms.  Low back/hip pain - Patient reports taking a jarring step at the bottom of a stairway that was higher than she expected about 2 weeks ago. She did not fall but felt as if there was a twist in her right lower back and some discomfort in her right hip. Since then the pain is slowly improving and not severe enough to take even a Tylenol. Osteoporosis- patient has been on Fosamax for osteoporosis for 3 years. She is tolerating the medication well. No further fractures. She is due for a follow-up DEXA.  Review of Systems  Constitutional: Negative for chills, fatigue and fever.  HENT: Negative for congestion, hearing loss, tinnitus, trouble swallowing and voice change.   Eyes: Negative for visual disturbance.  Respiratory: Negative for cough, chest tightness, shortness of breath and wheezing.   Cardiovascular: Negative for chest pain,  palpitations and leg swelling.  Gastrointestinal: Positive for heartburn. Negative for abdominal pain, constipation, diarrhea and vomiting.       Intermittent reflux  Endocrine: Negative for polydipsia and polyuria.  Genitourinary: Negative for dysuria, frequency, genital sores, vaginal bleeding and vaginal discharge.  Musculoskeletal: Positive for back pain (jarred lower back on a mis-step). Negative for arthralgias, gait problem and joint swelling.  Skin: Negative for color change and rash.  Neurological: Negative for dizziness, tremors, light-headedness and headaches.  Hematological: Negative for adenopathy. Does not bruise/bleed easily.  Psychiatric/Behavioral: Negative for dysphoric mood and sleep disturbance. The patient is not nervous/anxious.     Social History   Social History  . Marital status: Married    Spouse name: N/A  . Number of children: N/A  . Years of education: N/A   Occupational History  . Not on file.   Social History Main Topics  . Smoking status: Former Smoker    Types: Cigarettes  . Smokeless tobacco: Never Used  . Alcohol use No  . Drug use: No  . Sexual activity: Not on file   Other Topics Concern  . Not on file   Social History Narrative  . No narrative on file    Patient Active Problem List   Diagnosis Date Noted  . Diffuse photodamage of skin 02/13/2016  . Basal cell carcinoma 01/11/2016  . Esophageal reflux 07/03/2015  . Prediabetes 03/15/2015  . Sprain of rotator cuff capsule 01/11/2015  . Acquired hypothyroidism 10/31/2014  . Allergic rhinitis 10/31/2014  . Benign hypertension 10/31/2014  . CD (celiac disease) 10/31/2014  . Dyslipidemia 10/31/2014  . OP (osteoporosis) 10/31/2014    Past Surgical History:  Procedure  Laterality Date  . ABDOMINAL HYSTERECTOMY  1984   for bleeding  . Basil cell    . BILATERAL SALPINGOOPHORECTOMY  1984  . ORIF WRIST FRACTURE Left 2015  . TYMPANOPLASTY  1992    Her family history includes  Cancer in her mother; Heart disease in her father; Hypertension in her father.     Previous Medications   ALENDRONATE (FOSAMAX) 70 MG TABLET    Take 1 tablet (70 mg total) by mouth once a week.   AZELASTINE (ASTELIN) 0.1 % NASAL SPRAY    Place 2 sprays into both nostrils 2 (two) times daily. Use in each nostril as directed   BIOTIN 2500 MCG CAPS    Take 1 capsule by mouth daily.   CALCIUM-VITAMIN D-VITAMIN K [id number] MG-UNT-MCG CHEW    Chew 1 tablet by mouth daily.   FEXOFENADINE (ALLEGRA) 180 MG TABLET    Take 1 tablet by mouth daily.   LEVOTHYROXINE (SYNTHROID, LEVOTHROID) 50 MCG TABLET    Take 1 tablet (50 mcg total) by mouth daily.   LISINOPRIL-HYDROCHLOROTHIAZIDE (PRINZIDE,ZESTORETIC) 10-12.5 MG TABLET    Take 1 tablet by mouth daily.   OMEGA-3 FATTY ACIDS (FISH OIL) 1000 MG CPDR    Take 1 capsule by mouth daily.   PROBIOTIC PRODUCT (PROBIOTIC ADVANCED PO)    Take by mouth.   RANITIDINE (ZANTAC) 150 MG TABLET    Take 150 mg by mouth 2 (two) times daily.   SIMVASTATIN (ZOCOR) 20 MG TABLET    Take 1 tablet by mouth at bedtime.   TURMERIC PO    Take by mouth.    Patient Care Team: Glean Hess, MD as PCP - General (Family Medicine)      Objective:   Vitals: BP 128/66   Pulse 64   Ht 5' 5.5" (1.664 m)   Wt 155 lb 9.6 oz (70.6 kg)   SpO2 99%   BMI 25.50 kg/m   Physical Exam  Constitutional: She is oriented to person, place, and time. She appears well-developed and well-nourished. No distress.  HENT:  Head: Normocephalic and atraumatic.  Right Ear: Tympanic membrane and ear canal normal.  Left Ear: Tympanic membrane and ear canal normal.  Nose: Right sinus exhibits no maxillary sinus tenderness. Left sinus exhibits no maxillary sinus tenderness.  Mouth/Throat: Uvula is midline and oropharynx is clear and moist.  Eyes: Conjunctivae and EOM are normal. Right eye exhibits no discharge. Left eye exhibits no discharge. No scleral icterus.  Neck: Normal range of motion.  Carotid bruit is not present. No erythema present. No thyromegaly present.  Cardiovascular: Normal rate, regular rhythm, normal heart sounds and normal pulses.   Pulmonary/Chest: Effort normal. No respiratory distress. She has no wheezes. Right breast exhibits no mass, no nipple discharge, no skin change and no tenderness. Left breast exhibits no mass, no nipple discharge, no skin change and no tenderness.  Abdominal: Soft. Bowel sounds are normal. There is no hepatosplenomegaly. There is no tenderness. There is no CVA tenderness.  Musculoskeletal: Normal range of motion.  Lymphadenopathy:    She has no cervical adenopathy.    She has no axillary adenopathy.  Neurological: She is alert and oriented to person, place, and time. She has normal reflexes. No cranial nerve deficit or sensory deficit.  Skin: Skin is warm, dry and intact. No rash noted.  Psychiatric: She has a normal mood and affect. Her speech is normal and behavior is normal. Thought content normal.  Nursing note and vitals reviewed.   Activities of  Daily Living In your present state of health, do you have any difficulty performing the following activities: 10/29/2016 03/07/2016  Hearing? Y N  Vision? N N  Difficulty concentrating or making decisions? N N  Walking or climbing stairs? N N  Dressing or bathing? N N  Doing errands, shopping? N N  Preparing Food and eating ? N -  Using the Toilet? N -  In the past six months, have you accidently leaked urine? N -  Do you have problems with loss of bowel control? N -  Managing your Medications? N -  Managing your Finances? N -  Housekeeping or managing your Housekeeping? N -  Some recent data might be hidden    Fall Risk Assessment Fall Risk  10/29/2016 03/07/2016 01/01/2015  Falls in the past year? No No No      Depression Screen PHQ 2/9 Scores 10/29/2016 03/07/2016 01/01/2015  PHQ - 2 Score 0 0 0   6CIT Screen 10/29/2016  What Year? 0 points  What month? 0 points  What  time? 0 points  Count back from 20 0 points  Months in reverse 0 points  Repeat phrase 0 points  Total Score 0     Medicare Annual Wellness Visit Summary:  Reviewed patient's Family Medical History Reviewed and updated list of patient's medical providers Assessment of cognitive impairment was done Assessed patient's functional ability Established a written schedule for health screening [place] Completed and Reviewed  Exercise Activities and Dietary recommendations Goals    None      Immunization History  Administered Date(s) Administered  . Influenza-Unspecified 04/27/2015  . Pneumococcal Conjugate-13 06/29/2014  . Pneumococcal Polysaccharide-23 10/30/2008  . Td 10/30/2008  . Zoster 10/30/2008    Health Maintenance  Topic Date Due  . INFLUENZA VACCINE  02/18/2017  . MAMMOGRAM  07/07/2017  . TETANUS/TDAP  10/31/2018  . DEXA SCAN  Completed  . PNA vac Low Risk Adult  Completed    Discussed health benefits of physical activity, and encouraged her to engage in regular exercise appropriate for her age and condition.    ------------------------------------------------------------------------------------------------------------  Assessment & Plan:   1. Medicare annual wellness visit, subsequent Measures satisfied - POCT urinalysis dipstick  2. Benign hypertension controlled - Comprehensive metabolic panel  3. Acquired hypothyroidism supplemented - Lipid panel - TSH  4. CD (celiac disease) Continue with gluten free diet - no sx of disease are present - CBC with Differential/Platelet  5. Other osteoporosis, unspecified pathological fracture presence Continue Fosamax for 5 years - DG Bone Density; Future  6. Gastroesophageal reflux disease without esophagitis Symptoms well controlled on Zantac; remain off of omeprazole May supplement with Tums as needed   Meds ordered this encounter  Medications  . simvastatin (ZOCOR) 20 MG  tablet    Sig: Take 1 tablet (20 mg total) by mouth at bedtime.    Dispense:  90 tablet    Refill:  3  . alendronate (FOSAMAX) 70 MG tablet    Sig: Take 1 tablet (70 mg total) by mouth once a week.    Dispense:  12 tablet    Refill:  [place], MD [facility]  10/29/2016

## 2016-10-30 ENCOUNTER — Encounter: Payer: Self-pay | Admitting: Internal Medicine

## 2016-10-30 LAB — LIPID PANEL
CHOL/HDL RATIO: 2.6 ratio (ref 0.0–4.4)
CHOLESTEROL TOTAL: 164 mg/dL (ref 100–199)
HDL: 62 mg/dL (ref >39)
LDL Calculated: 81 mg/dL (ref 0–99)
TRIGLYCERIDES: 104 mg/dL (ref 0–149)
VLDL Cholesterol Cal: 21 mg/dL (ref 5–40)

## 2016-10-30 LAB — COMPREHENSIVE METABOLIC PANEL
A/G RATIO: 1.8 (ref 1.2–2.2)
ALK PHOS: 38 IU/L — AB (ref 39–117)
ALT: 29 IU/L (ref 0–32)
AST: 28 IU/L (ref 0–40)
Albumin: 4.9 g/dL — ABNORMAL HIGH (ref 3.5–4.8)
BILIRUBIN TOTAL: 0.5 mg/dL (ref 0.0–1.2)
BUN/Creatinine Ratio: 16 (ref 12–28)
BUN: 13 mg/dL (ref 8–27)
CALCIUM: 10.1 mg/dL (ref 8.7–10.3)
CHLORIDE: 96 mmol/L (ref 96–106)
CO2: 26 mmol/L (ref 18–29)
Creatinine, Ser: 0.83 mg/dL (ref 0.57–1.00)
GFR calc Af Amer: 78 mL/min/{1.73_m2} (ref >59)
GFR, EST NON AFRICAN AMERICAN: 67 mL/min/{1.73_m2} (ref >59)
GLOBULIN, TOTAL: 2.7 g/dL (ref 1.5–4.5)
Glucose: 109 mg/dL — ABNORMAL HIGH (ref 65–99)
Potassium: 4.2 mmol/L (ref 3.5–5.2)
SODIUM: 140 mmol/L (ref 134–144)
Total Protein: 7.6 g/dL (ref 6.0–8.5)

## 2016-10-30 LAB — CBC WITH DIFFERENTIAL/PLATELET
BASOS: 1 %
Basophils Absolute: 0 10*3/uL (ref 0.0–0.2)
EOS (ABSOLUTE): 0.2 10*3/uL (ref 0.0–0.4)
Eos: 4 %
HEMATOCRIT: 43.4 % (ref 34.0–46.6)
Hemoglobin: 14.4 g/dL (ref 11.1–15.9)
IMMATURE GRANULOCYTES: 0 %
Immature Grans (Abs): 0 10*3/uL (ref 0.0–0.1)
LYMPHS ABS: 1.8 10*3/uL (ref 0.7–3.1)
LYMPHS: 28 %
MCH: 29.4 pg (ref 26.6–33.0)
MCHC: 33.2 g/dL (ref 31.5–35.7)
MCV: 89 fL (ref 79–97)
MONOS ABS: 0.5 10*3/uL (ref 0.1–0.9)
Monocytes: 7 %
NEUTROS ABS: 3.8 10*3/uL (ref 1.4–7.0)
Neutrophils: 60 %
PLATELETS: 202 10*3/uL (ref 150–379)
RBC: 4.9 x10E6/uL (ref 3.77–5.28)
RDW: 14.2 % (ref 12.3–15.4)
WBC: 6.3 10*3/uL (ref 3.4–10.8)

## 2016-10-30 LAB — HM DIABETES EYE EXAM

## 2016-10-30 LAB — TSH: TSH: 2.29 u[IU]/mL (ref 0.450–4.500)

## 2016-10-30 NOTE — Progress Notes (Signed)
Added A1C and spoke to pt. Awaiting result.

## 2016-11-04 LAB — SPECIMEN STATUS REPORT

## 2016-11-04 LAB — HGB A1C W/O EAG: Hgb A1c MFr Bld: 6 % — ABNORMAL HIGH (ref 4.8–5.6)

## 2016-11-07 ENCOUNTER — Encounter: Payer: Self-pay | Admitting: Internal Medicine

## 2016-11-12 ENCOUNTER — Ambulatory Visit
Admission: RE | Admit: 2016-11-12 | Discharge: 2016-11-12 | Disposition: A | Discharge status: Home / Self Care | Payer: Medicare Other | Source: Ambulatory Visit | Attending: Internal Medicine | Admitting: Internal Medicine

## 2016-11-12 DIAGNOSIS — Z780 Asymptomatic menopausal state: Secondary | ICD-10-CM | POA: Insufficient documentation

## 2016-11-12 DIAGNOSIS — M818 Other osteoporosis without current pathological fracture: Secondary | ICD-10-CM

## 2016-11-12 DIAGNOSIS — M85852 Other specified disorders of bone density and structure, left thigh: Secondary | ICD-10-CM | POA: Diagnosis not present

## 2016-11-12 DIAGNOSIS — E2839 Other primary ovarian failure: Secondary | ICD-10-CM | POA: Insufficient documentation

## 2016-11-17 ENCOUNTER — Encounter: Payer: Self-pay | Admitting: *Deleted

## 2016-11-20 NOTE — Discharge Instructions (Signed)
Cataract Surgery, Care After Refer to this sheet in the next few weeks. These instructions provide you with information about caring for yourself after your procedure. Your health care provider may also give you more specific instructions. Your treatment has been planned according to current medical practices, but problems sometimes occur. Call your health care provider if you have any problems or questions after your procedure. What can I expect after the procedure? After the procedure, it is common to have:  Itching.  Discomfort.  Fluid discharge.  Sensitivity to light and to touch.  Bruising. Follow these instructions at home: [place] your eye every day for signs of infection. Watch for:  Redness, swelling, or pain.  Fluid, blood, or pus.  Warmth.  Bad smell. Activity   Avoid strenuous activities, such as playing contact sports, for as long as told by your health care provider.  Do not drive or operate heavy machinery until your health care provider approves.  Do not bend or lift heavy objects. Bending increases pressure in the eye. You can walk, climb stairs, and do light household chores.  Ask your health care provider when you can return to work. If you work in a dusty environment, you may be advised to wear protective eyewear for a period of time. General instructions   Take or apply over-the-counter and prescription medicines only as told by your health care provider. This includes eye drops.  Do not touch or rub your eyes.  If you were given a protective shield, wear it as told by your health care provider. If you were not given a protective shield, wear sunglasses as told by your health care provider to protect your eyes.  Keep the area around your eye clean and dry. Avoid swimming or allowing water to hit you directly in the face while showering until told by your health care provider. Keep soap and shampoo out of your eyes.  Do not put a contact lens  into the affected eye or eyes until your health care provider approves.  Keep all follow-up visits as told by your health care provider. This is important. Contact a health care provider if:   You have increased bruising around your eye.  You have pain that is not helped with medicine.  You have a fever.  You have redness, swelling, or pain in your eye.  You have fluid, blood, or pus coming from your incision.  Your vision gets worse. Get help right away if:  You have sudden vision loss. This information is not intended to replace advice given to you by your health care provider. Make sure you discuss any questions you have with your health care provider. Document Released: 01/24/2005 Document Revised: 11/15/2015 Document Reviewed: 05/17/2015 Elsevier Interactive Patient Education  2017 [place] Anesthesia, Adult, Care After These instructions provide you with information about caring for yourself after your procedure. Your health care provider may also give you more specific instructions. Your treatment has been planned according to current medical practices, but problems sometimes occur. Call your health care provider if you have any problems or questions after your procedure. What can I expect after the procedure? After the procedure, it is common to have:  Vomiting.  A sore throat.  Mental slowness. It is common to feel:  Nauseous.  Cold or shivery.  Sleepy.  Tired.  Sore or achy, even in parts of your body where you did not have surgery. Follow these instructions at  home: For at least 24 hours after the procedure:   Do not:  Participate in activities where you could fall or become injured.  Drive.  Use heavy machinery.  Drink alcohol.  Take sleeping pills or medicines that cause drowsiness.  Make important decisions or sign legal documents.  Take care of children on your own.  Rest. Eating and drinking   If you vomit, drink  water, juice, or soup when you can drink without vomiting.  Drink enough fluid to keep your urine clear or pale yellow.  Make sure you have little or no nausea before eating solid foods.  Follow the diet recommended by your health care provider. General instructions   Have a responsible adult stay with you until you are awake and alert.  Return to your normal activities as told by your health care provider. Ask your health care provider what activities are safe for you.  Take over-the-counter and prescription medicines only as told by your health care provider.  If you smoke, do not smoke without supervision.  Keep all follow-up visits as told by your health care provider. This is important. Contact a health care provider if:  You continue to have nausea or vomiting at home, and medicines are not helpful.  You cannot drink fluids or start eating again.  You cannot urinate after 8-12 hours.  You develop a skin rash.  You have fever.  You have increasing redness at the site of your procedure. Get help right away if:  You have difficulty breathing.  You have chest pain.  You have unexpected bleeding.  You feel that you are having a life-threatening or urgent problem. This information is not intended to replace advice given to you by your health care provider. Make sure you discuss any questions you have with your health care provider. Document Released: 10/13/2000 Document Revised: 12/10/2015 Document Reviewed: 06/21/2015 Elsevier Interactive Patient Education  2017 [employer].

## 2016-11-24 ENCOUNTER — Ambulatory Visit: Payer: Medicare Other | Admitting: Anesthesiology

## 2016-11-24 ENCOUNTER — Ambulatory Visit
Admission: RE | Admit: 2016-11-24 | Discharge: 2016-11-24 | Disposition: A | Discharge status: Home / Self Care | Payer: Medicare Other | Source: Ambulatory Visit | Attending: Ophthalmology | Admitting: Ophthalmology

## 2016-11-24 ENCOUNTER — Encounter
Admission: RE | Disposition: A | Discharge status: Home / Self Care | Payer: Self-pay | Source: Ambulatory Visit | Attending: Ophthalmology

## 2016-11-24 DIAGNOSIS — I10 Essential (primary) hypertension: Secondary | ICD-10-CM | POA: Insufficient documentation

## 2016-11-24 DIAGNOSIS — E039 Hypothyroidism, unspecified: Secondary | ICD-10-CM | POA: Diagnosis not present

## 2016-11-24 DIAGNOSIS — H2511 Age-related nuclear cataract, right eye: Secondary | ICD-10-CM | POA: Insufficient documentation

## 2016-11-24 DIAGNOSIS — K219 Gastro-esophageal reflux disease without esophagitis: Secondary | ICD-10-CM | POA: Insufficient documentation

## 2016-11-24 HISTORY — DX: Hypothyroidism, unspecified: E03.9

## 2016-11-24 HISTORY — DX: Prediabetes: R73.03

## 2016-11-24 HISTORY — PX: CATARACT EXTRACTION W/PHACO: SHX586

## 2016-11-24 HISTORY — DX: Celiac disease: K90.0

## 2016-11-24 HISTORY — DX: Presence of dental prosthetic device (complete) (partial): Z97.2

## 2016-11-24 HISTORY — DX: Essential (primary) hypertension: I10

## 2016-11-24 HISTORY — DX: Dizziness and giddiness: R42

## 2016-11-24 SURGERY — PHACOEMULSIFICATION, CATARACT, WITH IOL INSERTION
Anesthesia: Monitor Anesthesia Care | Site: Eye | Laterality: Right | Wound class: Clean

## 2016-11-24 MED ORDER — FENTANYL CITRATE (PF) 100 MCG/2ML IJ SOLN
INTRAMUSCULAR | Status: DC | PRN
  Administered 2016-11-24: 50 ug via INTRAVENOUS

## 2016-11-24 MED ORDER — EPINEPHRINE PF 1 MG/ML IJ SOLN
INTRAOCULAR | Status: DC | PRN
  Administered 2016-11-24: 70 mL via OPHTHALMIC

## 2016-11-24 MED ORDER — BRIMONIDINE TARTRATE-TIMOLOL 0.2-0.5 % OP SOLN
OPHTHALMIC | Status: DC | PRN
  Administered 2016-11-24: 1 [drp] via OPHTHALMIC

## 2016-11-24 MED ORDER — CEFUROXIME OPHTHALMIC INJECTION 1 MG/0.1 ML
INJECTION | OPHTHALMIC | Status: DC | PRN
  Administered 2016-11-24: 0.1 mL via INTRACAMERAL

## 2016-11-24 MED ORDER — POLYMYXIN B-TRIMETHOPRIM 10000-0.1 UNIT/ML-% OP SOLN
1.0000 [drp] | OPHTHALMIC | Status: DC | PRN
  Administered 2016-11-24 (×3): 1 [drp] via OPHTHALMIC

## 2016-11-24 MED ORDER — ARMC OPHTHALMIC DILATING DROPS
1.0000 "application " | OPHTHALMIC | Status: DC | PRN
  Administered 2016-11-24 (×3): 1 via OPHTHALMIC

## 2016-11-24 MED ORDER — NA HYALUR & NA CHOND-NA HYALUR 0.4-0.35 ML IO KIT
PACK | INTRAOCULAR | Status: DC | PRN
  Administered 2016-11-24: 1 mL via INTRAOCULAR

## 2016-11-24 MED ORDER — MIDAZOLAM HCL 2 MG/2ML IJ SOLN
INTRAMUSCULAR | Status: DC | PRN
  Administered 2016-11-24: 2 mg via INTRAVENOUS

## 2016-11-24 MED ORDER — LACTATED RINGERS IV SOLN: INTRAVENOUS | Status: DC

## 2016-11-24 MED ORDER — LIDOCAINE HCL (PF) 2 % IJ SOLN
INTRAOCULAR | Status: DC | PRN
  Administered 2016-11-24: 1 mL via INTRAOCULAR

## 2016-11-24 SURGICAL SUPPLY — 27 items
CANNULA ANT/CHMB 27G (MISCELLANEOUS) ×1 IMPLANT
CANNULA ANT/CHMB 27GA (MISCELLANEOUS) ×3 IMPLANT
CARTRIDGE ABBOTT (MISCELLANEOUS) IMPLANT
GLOVE SURG LX 7.5 STRW (GLOVE) ×2
GLOVE SURG LX STRL 7.5 STRW (GLOVE) ×1 IMPLANT
GLOVE SURG TRIUMPH 8.0 PF LTX (GLOVE) ×3 IMPLANT
GOWN STRL REUS W/ TWL LRG LVL3 (GOWN DISPOSABLE) ×2 IMPLANT
GOWN STRL REUS W/TWL LRG LVL3 (GOWN DISPOSABLE) ×6
LENS IOL TECNIS ITEC 21.0 (Intraocular Lens) ×2 IMPLANT
MARKER SKIN DUAL TIP RULER LAB (MISCELLANEOUS) ×3 IMPLANT
NDL FILTER BLUNT 18X1 1/2 (NEEDLE) ×1 IMPLANT
NDL RETROBULBAR .5 NSTRL (NEEDLE) IMPLANT
NEEDLE FILTER BLUNT 18X 1/2SAF (NEEDLE) ×2
NEEDLE FILTER BLUNT 18X1 1/2 (NEEDLE) ×1 IMPLANT
PACK CATARACT BRASINGTON (MISCELLANEOUS) ×3 IMPLANT
PACK EYE AFTER SURG (MISCELLANEOUS) ×3 IMPLANT
PACK OPTHALMIC (MISCELLANEOUS) ×3 IMPLANT
RING MALYGIN 7.0 (MISCELLANEOUS) IMPLANT
SUT ETHILON 10-0 CS-B-6CS-B-6 (SUTURE)
SUT VICRYL  9 0 (SUTURE)
SUT VICRYL 9 0 (SUTURE) IMPLANT
SUTURE EHLN 10-0 CS-B-6CS-B-6 (SUTURE) IMPLANT
SYR 3ML LL SCALE MARK (SYRINGE) ×3 IMPLANT
SYR 5ML LL (SYRINGE) ×3 IMPLANT
SYR TB 1ML LUER SLIP (SYRINGE) ×3 IMPLANT
WATER STERILE IRR 250ML POUR (IV SOLUTION) ×3 IMPLANT
WIPE NON LINTING 3.25X3.25 (MISCELLANEOUS) ×3 IMPLANT

## 2016-11-24 NOTE — Transfer of Care (Signed)
Immediate Anesthesia Transfer of Care Note  Patient: Jill Dunlap  Procedure(s) Performed: Procedure(s): CATARACT EXTRACTION PHACO AND INTRAOCULAR LENS PLACEMENT (IOC) Right (Right)  Patient Location: PACU  Anesthesia Type: MAC  Level of Consciousness: awake, alert  and patient cooperative  Airway and Oxygen Therapy: Patient Spontanous Breathing and Patient connected to supplemental oxygen  Post-op Assessment: Post-op Vital signs reviewed, Patient's Cardiovascular Status Stable, Respiratory Function Stable, Patent Airway and No signs of Nausea or vomiting  Post-op Vital Signs: Reviewed and stable  Complications: No apparent anesthesia complications

## 2016-11-24 NOTE — Op Note (Signed)
LOCATION:  [place]   PREOPERATIVE DIAGNOSIS:    Nuclear sclerotic cataract right eye. H25.11   POSTOPERATIVE DIAGNOSIS:  Nuclear sclerotic cataract right eye.     PROCEDURE:  Phacoemusification with posterior chamber intraocular lens placement of the right eye   LENS:   Implant Name Type Inv. Item Serial No. Manufacturer Lot No. LRB No. Used  LENS IOL DIOP 21.0 - [id number] Intraocular Lens LENS IOL DIOP 21.0 [id number] AMO   Right 1        ULTRASOUND TIME: 18 % of 1 minutes, 26 seconds.  CDE 15.4   SURGEON:  Wyonia Hough, MD   ANESTHESIA:  Topical with tetracaine drops and 2% Xylocaine jelly, augmented with 1% preservative-free intracameral lidocaine.    COMPLICATIONS:  None.   DESCRIPTION OF PROCEDURE:  The patient was identified in the holding room and transported to the operating room and placed in the supine position under the operating microscope.  The right eye was identified as the operative eye and it was prepped and draped in the usual sterile ophthalmic fashion.   A 1 millimeter clear-corneal paracentesis was made at the 12:00 position.  0.5 ml of preservative-free 1% lidocaine was injected into the anterior chamber. The anterior chamber was filled with Viscoat viscoelastic.  A 2.4 millimeter keratome was used to make a near-clear corneal incision at the 9:00 position.  A curvilinear capsulorrhexis was made with a cystotome and capsulorrhexis forceps.  Balanced salt solution was used to hydrodissect and hydrodelineate the nucleus.   Phacoemulsification was then used in stop and chop fashion to remove the lens nucleus and epinucleus.  The remaining cortex was then removed using the irrigation and aspiration handpiece. Provisc was then placed into the capsular bag to distend it for lens placement.  A lens was then injected into the capsular bag.  The remaining viscoelastic was aspirated.   Wounds were hydrated with balanced salt solution.  The anterior  chamber was inflated to a physiologic pressure with balanced salt solution.  No wound leaks were noted. Cefuroxime 0.1 ml of a 86m/ml solution was injected into the anterior chamber for a dose of 1 mg of intracameral antibiotic at the completion of the case.   Timolol and Brimonidine drops were applied to the eye.  The patient was taken to the recovery room in stable condition without complications of anesthesia or surgery.   Jill Dunlap 11/24/2016, 9:38 AM

## 2016-11-24 NOTE — Anesthesia Preprocedure Evaluation (Signed)
Anesthesia Evaluation  Patient identified by MRN, date of birth, ID band Patient awake    Reviewed: Allergy & Precautions, H&P , NPO status , Patient's Chart, lab work & pertinent test results  Airway Mallampati: II  TM Distance: >3 FB Neck ROM: full    Dental no notable dental hx.    Pulmonary    Pulmonary exam normal        Cardiovascular hypertension, Normal cardiovascular exam     Neuro/Psych    GI/Hepatic GERD  ,  Endo/Other  Hypothyroidism   Renal/GU      Musculoskeletal   Abdominal   Peds  Hematology   Anesthesia Other Findings   Reproductive/Obstetrics                             Anesthesia Physical Anesthesia Plan  ASA: II  Anesthesia Plan: MAC   Post-op Pain Management:    Induction:   Airway Management Planned:   Additional Equipment:   Intra-op Plan:   Post-operative Plan:   Informed Consent: I have reviewed the patients History and Physical, chart, labs and discussed the procedure including the risks, benefits and alternatives for the proposed anesthesia with the patient or authorized representative who has indicated his/her understanding and acceptance.     Plan Discussed with:   Anesthesia Plan Comments:         Anesthesia Quick Evaluation

## 2016-11-24 NOTE — Anesthesia Postprocedure Evaluation (Signed)
Anesthesia Post Note  Patient: Jill Dunlap  Procedure(s) Performed: Procedure(s) (LRB): CATARACT EXTRACTION PHACO AND INTRAOCULAR LENS PLACEMENT (IOC) Right (Right)  Patient location during evaluation: PACU Anesthesia Type: MAC Level of consciousness: awake and alert and oriented Pain management: satisfactory to patient Vital Signs Assessment: post-procedure vital signs reviewed and stable Respiratory status: spontaneous breathing, nonlabored ventilation and respiratory function stable Cardiovascular status: blood pressure returned to baseline and stable Postop Assessment: Adequate PO intake and No signs of nausea or vomiting Anesthetic complications: no    Raliegh Ip

## 2016-11-24 NOTE — H&P (Signed)
The History and Physical notes are on paper, have been signed, and are to be scanned. The patient remains stable and unchanged from the H&P.   Previous H&P reviewed, patient examined, and there are no changes.  Jill Dunlap 11/24/2016 8:50 AM

## 2016-11-24 NOTE — Anesthesia Procedure Notes (Signed)
Procedure Name: MAC Performed by: Demondre Aguas Pre-anesthesia Checklist: Patient identified, Emergency Drugs available, Suction available, Timeout performed and Patient being monitored Patient Re-evaluated:Patient Re-evaluated prior to inductionOxygen Delivery Method: Nasal cannula Placement Confirmation: positive ETCO2     

## 2016-11-25 ENCOUNTER — Encounter: Payer: Self-pay | Admitting: Ophthalmology

## 2017-01-10 ENCOUNTER — Other Ambulatory Visit: Payer: Self-pay | Admitting: Internal Medicine

## 2017-02-02 NOTE — Discharge Instructions (Signed)
Cataract Surgery, Care After Refer to this sheet in the next few weeks. These instructions provide you with information about caring for yourself after your procedure. Your health care provider may also give you more specific instructions. Your treatment has been planned according to current medical practices, but problems sometimes occur. Call your health care provider if you have any problems or questions after your procedure. What can I expect after the procedure? After the procedure, it is common to have:  Itching.  Discomfort.  Fluid discharge.  Sensitivity to light and to touch.  Bruising.  Follow these instructions at home: [place] your eye every day for signs of infection. Watch for: ? Redness, swelling, or pain. ? Fluid, blood, or pus. ? Warmth. ? Bad smell. Activity  Avoid strenuous activities, such as playing contact sports, for as long as told by your health care provider.  Do not drive or operate heavy machinery until your health care provider approves.  Do not bend or lift heavy objects. Bending increases pressure in the eye. You can walk, climb stairs, and do light household chores.  Ask your health care provider when you can return to work. If you work in a dusty environment, you may be advised to wear protective eyewear for a period of time. General instructions  Take or apply over-the-counter and prescription medicines only as told by your health care provider. This includes eye drops.  Do not touch or rub your eyes.  If you were given a protective shield, wear it as told by your health care provider. If you were not given a protective shield, wear sunglasses as told by your health care provider to protect your eyes.  Keep the area around your eye clean and dry. Avoid swimming or allowing water to hit you directly in the face while showering until told by your health care provider. Keep soap and shampoo out of your eyes.  Do not put a contact lens  into the affected eye or eyes until your health care provider approves.  Keep all follow-up visits as told by your health care provider. This is important. Contact a health care provider if:   You have increased bruising around your eye.  You have pain that is not helped with medicine.  You have a fever.  You have redness, swelling, or pain in your eye.  You have fluid, blood, or pus coming from your incision.  Your vision gets worse. Get help right away if:  You have sudden vision loss. This information is not intended to replace advice given to you by your health care provider. Make sure you discuss any questions you have with your health care provider. Document Released: 01/24/2005 Document Revised: 11/15/2015 Document Reviewed: 05/17/2015 Elsevier Interactive Patient Education  2017 [place] Anesthesia, Adult, Care After These instructions provide you with information about caring for yourself after your procedure. Your health care provider may also give you more specific instructions. Your treatment has been planned according to current medical practices, but problems sometimes occur. Call your health care provider if you have any problems or questions after your procedure. What can I expect after the procedure? After the procedure, it is common to have:  Vomiting.  A sore throat.  Mental slowness.  It is common to feel:  Nauseous.  Cold or shivery.  Sleepy.  Tired.  Sore or achy, even in parts of your body where you did not have surgery.  Follow these instructions at home: For  at least 24 hours after the procedure:  Do not: ? Participate in activities where you could fall or become injured. ? Drive. ? Use heavy machinery. ? Drink alcohol. ? Take sleeping pills or medicines that cause drowsiness. ? Make important decisions or sign legal documents. ? Take care of children on your own.  Rest. Eating and drinking  If you vomit, drink  water, juice, or soup when you can drink without vomiting.  Drink enough fluid to keep your urine clear or pale yellow.  Make sure you have little or no nausea before eating solid foods.  Follow the diet recommended by your health care provider. General instructions  Have a responsible adult stay with you until you are awake and alert.  Return to your normal activities as told by your health care provider. Ask your health care provider what activities are safe for you.  Take over-the-counter and prescription medicines only as told by your health care provider.  If you smoke, do not smoke without supervision.  Keep all follow-up visits as told by your health care provider. This is important. Contact a health care provider if:  You continue to have nausea or vomiting at home, and medicines are not helpful.  You cannot drink fluids or start eating again.  You cannot urinate after 8-12 hours.  You develop a skin rash.  You have fever.  You have increasing redness at the site of your procedure. Get help right away if:  You have difficulty breathing.  You have chest pain.  You have unexpected bleeding.  You feel that you are having a life-threatening or urgent problem. This information is not intended to replace advice given to you by your health care provider. Make sure you discuss any questions you have with your health care provider. Document Released: 10/13/2000 Document Revised: 12/10/2015 Document Reviewed: 06/21/2015 Elsevier Interactive Patient Education  [employer].

## 2017-02-04 ENCOUNTER — Encounter
Admission: RE | Disposition: A | Discharge status: Home / Self Care | Payer: Self-pay | Source: Ambulatory Visit | Attending: Ophthalmology

## 2017-02-04 ENCOUNTER — Ambulatory Visit: Payer: Medicare Other | Admitting: Anesthesiology

## 2017-02-04 ENCOUNTER — Ambulatory Visit
Admission: RE | Admit: 2017-02-04 | Discharge: 2017-02-04 | Disposition: A | Discharge status: Home / Self Care | Payer: Medicare Other | Source: Ambulatory Visit | Attending: Ophthalmology | Admitting: Ophthalmology

## 2017-02-04 DIAGNOSIS — Z881 Allergy status to other antibiotic agents status: Secondary | ICD-10-CM | POA: Diagnosis not present

## 2017-02-04 DIAGNOSIS — K900 Celiac disease: Secondary | ICD-10-CM | POA: Diagnosis not present

## 2017-02-04 DIAGNOSIS — H2512 Age-related nuclear cataract, left eye: Secondary | ICD-10-CM | POA: Diagnosis not present

## 2017-02-04 DIAGNOSIS — E039 Hypothyroidism, unspecified: Secondary | ICD-10-CM | POA: Diagnosis not present

## 2017-02-04 DIAGNOSIS — Z9841 Cataract extraction status, right eye: Secondary | ICD-10-CM | POA: Diagnosis not present

## 2017-02-04 DIAGNOSIS — M8580 Other specified disorders of bone density and structure, unspecified site: Secondary | ICD-10-CM | POA: Diagnosis not present

## 2017-02-04 DIAGNOSIS — E7800 Pure hypercholesterolemia, unspecified: Secondary | ICD-10-CM | POA: Insufficient documentation

## 2017-02-04 DIAGNOSIS — I10 Essential (primary) hypertension: Secondary | ICD-10-CM | POA: Insufficient documentation

## 2017-02-04 DIAGNOSIS — K219 Gastro-esophageal reflux disease without esophagitis: Secondary | ICD-10-CM | POA: Insufficient documentation

## 2017-02-04 DIAGNOSIS — H9193 Unspecified hearing loss, bilateral: Secondary | ICD-10-CM | POA: Diagnosis not present

## 2017-02-04 DIAGNOSIS — Z85828 Personal history of other malignant neoplasm of skin: Secondary | ICD-10-CM | POA: Diagnosis not present

## 2017-02-04 HISTORY — PX: CATARACT EXTRACTION W/PHACO: SHX586

## 2017-02-04 SURGERY — PHACOEMULSIFICATION, CATARACT, WITH IOL INSERTION
Anesthesia: Monitor Anesthesia Care | Site: Eye | Laterality: Left | Wound class: Clean

## 2017-02-04 MED ORDER — BALANCED SALT IO SOLN
INTRAOCULAR | Status: DC | PRN
  Administered 2017-02-04: .5 mL via INTRAOCULAR

## 2017-02-04 MED ORDER — CEFUROXIME OPHTHALMIC INJECTION 1 MG/0.1 ML
INJECTION | OPHTHALMIC | Status: DC | PRN
  Administered 2017-02-04: 0.1 mL via INTRACAMERAL

## 2017-02-04 MED ORDER — ACETAMINOPHEN 160 MG/5ML PO SOLN: 325.0000 mg | ORAL | Status: DC | PRN

## 2017-02-04 MED ORDER — BRIMONIDINE TARTRATE-TIMOLOL 0.2-0.5 % OP SOLN
OPHTHALMIC | Status: DC | PRN
  Administered 2017-02-04: 1 [drp] via OPHTHALMIC

## 2017-02-04 MED ORDER — ACETAMINOPHEN 325 MG PO TABS: 325.0000 mg | ORAL_TABLET | ORAL | Status: DC | PRN

## 2017-02-04 MED ORDER — ARMC OPHTHALMIC DILATING DROPS
1.0000 "application " | OPHTHALMIC | Status: DC | PRN
  Administered 2017-02-04 (×3): 1 via OPHTHALMIC

## 2017-02-04 MED ORDER — NA HYALUR & NA CHOND-NA HYALUR 0.4-0.35 ML IO KIT
PACK | INTRAOCULAR | Status: DC | PRN
  Administered 2017-02-04: 1 mL via INTRAOCULAR

## 2017-02-04 MED ORDER — POLYMYXIN B-TRIMETHOPRIM 10000-0.1 UNIT/ML-% OP SOLN
1.0000 [drp] | OPHTHALMIC | Status: DC | PRN
  Administered 2017-02-04 (×3): 1 [drp] via OPHTHALMIC

## 2017-02-04 MED ORDER — EPINEPHRINE PF 1 MG/ML IJ SOLN
INTRAOCULAR | Status: DC | PRN
  Administered 2017-02-04: 65 mL via OPHTHALMIC

## 2017-02-04 MED ORDER — LACTATED RINGERS IV SOLN: INTRAVENOUS | Status: DC

## 2017-02-04 MED ORDER — MIDAZOLAM HCL 2 MG/2ML IJ SOLN
INTRAMUSCULAR | Status: DC | PRN
  Administered 2017-02-04: 2 mg via INTRAVENOUS

## 2017-02-04 MED ORDER — FENTANYL CITRATE (PF) 100 MCG/2ML IJ SOLN
INTRAMUSCULAR | Status: DC | PRN
  Administered 2017-02-04: 50 ug via INTRAVENOUS

## 2017-02-04 SURGICAL SUPPLY — 27 items
CANNULA ANT/CHMB 27G (MISCELLANEOUS) ×1 IMPLANT
CANNULA ANT/CHMB 27GA (MISCELLANEOUS) ×3 IMPLANT
CARTRIDGE ABBOTT (MISCELLANEOUS) IMPLANT
GLOVE SURG LX 7.5 STRW (GLOVE) ×2
GLOVE SURG LX STRL 7.5 STRW (GLOVE) ×1 IMPLANT
GLOVE SURG TRIUMPH 8.0 PF LTX (GLOVE) ×3 IMPLANT
GOWN STRL REUS W/ TWL LRG LVL3 (GOWN DISPOSABLE) ×2 IMPLANT
GOWN STRL REUS W/TWL LRG LVL3 (GOWN DISPOSABLE) ×6
LENS IOL TECNIS ITEC 20.5 (Intraocular Lens) ×2 IMPLANT
MARKER SKIN DUAL TIP RULER LAB (MISCELLANEOUS) ×3 IMPLANT
NDL FILTER BLUNT 18X1 1/2 (NEEDLE) ×1 IMPLANT
NDL RETROBULBAR .5 NSTRL (NEEDLE) IMPLANT
NEEDLE FILTER BLUNT 18X 1/2SAF (NEEDLE) ×2
NEEDLE FILTER BLUNT 18X1 1/2 (NEEDLE) ×1 IMPLANT
PACK CATARACT BRASINGTON (MISCELLANEOUS) ×3 IMPLANT
PACK EYE AFTER SURG (MISCELLANEOUS) ×3 IMPLANT
PACK OPTHALMIC (MISCELLANEOUS) ×3 IMPLANT
RING MALYGIN 7.0 (MISCELLANEOUS) IMPLANT
SUT ETHILON 10-0 CS-B-6CS-B-6 (SUTURE)
SUT VICRYL  9 0 (SUTURE)
SUT VICRYL 9 0 (SUTURE) IMPLANT
SUTURE EHLN 10-0 CS-B-6CS-B-6 (SUTURE) IMPLANT
SYR 3ML LL SCALE MARK (SYRINGE) ×3 IMPLANT
SYR 5ML LL (SYRINGE) ×3 IMPLANT
SYR TB 1ML LUER SLIP (SYRINGE) ×3 IMPLANT
WATER STERILE IRR 250ML POUR (IV SOLUTION) ×3 IMPLANT
WIPE NON LINTING 3.25X3.25 (MISCELLANEOUS) ×3 IMPLANT

## 2017-02-04 NOTE — H&P (Signed)
The History and Physical notes are on paper, have been signed, and are to be scanned. The patient remains stable and unchanged from the H&P.   Previous H&P reviewed, patient examined, and there are no changes.  Jill Dunlap Junio 02/04/2017 9:56 AM

## 2017-02-04 NOTE — Anesthesia Postprocedure Evaluation (Signed)
Anesthesia Post Note  Patient: Annaka Cleaver  Procedure(s) Performed: Procedure(s) (LRB): CATARACT EXTRACTION PHACO AND INTRAOCULAR LENS PLACEMENT (IOC)  Left (Left)  Patient location during evaluation: PACU Anesthesia Type: MAC Level of consciousness: awake and alert and oriented Pain management: satisfactory to patient Vital Signs Assessment: post-procedure vital signs reviewed and stable Respiratory status: spontaneous breathing, nonlabored ventilation and respiratory function stable Cardiovascular status: blood pressure returned to baseline and stable Postop Assessment: Adequate PO intake and No signs of nausea or vomiting Anesthetic complications: no    Raliegh Ip

## 2017-02-04 NOTE — Anesthesia Preprocedure Evaluation (Signed)
Anesthesia Evaluation  Patient identified by MRN, date of birth, ID band Patient awake    Reviewed: Allergy & Precautions, H&P , NPO status , Patient's Chart, lab work & pertinent test results  Airway Mallampati: II  TM Distance: >3 FB Neck ROM: full    Dental no notable dental hx.    Pulmonary    Pulmonary exam normal breath sounds clear to auscultation       Cardiovascular hypertension, Normal cardiovascular exam Rhythm:regular Rate:Normal     Neuro/Psych    GI/Hepatic GERD  ,  Endo/Other  Hypothyroidism   Renal/GU      Musculoskeletal   Abdominal   Peds  Hematology   Anesthesia Other Findings   Reproductive/Obstetrics                             Anesthesia Physical  Anesthesia Plan  ASA: II  Anesthesia Plan: MAC   Post-op Pain Management:    Induction:   PONV Risk Score and Plan: 2 and Midazolam  Airway Management Planned:   Additional Equipment:   Intra-op Plan:   Post-operative Plan:   Informed Consent: I have reviewed the patients History and Physical, chart, labs and discussed the procedure including the risks, benefits and alternatives for the proposed anesthesia with the patient or authorized representative who has indicated his/her understanding and acceptance.     Plan Discussed with: CRNA  Anesthesia Plan Comments:         Anesthesia Quick Evaluation

## 2017-02-04 NOTE — Anesthesia Procedure Notes (Signed)
Procedure Name: MAC Performed by: Jeania Nater Pre-anesthesia Checklist: Patient identified, Emergency Drugs available, Suction available, Timeout performed and Patient being monitored Patient Re-evaluated:Patient Re-evaluated prior to inductionOxygen Delivery Method: Nasal cannula Placement Confirmation: positive ETCO2     

## 2017-02-04 NOTE — Transfer of Care (Signed)
**Note Jill-Identified via Obfuscation** Immediate Anesthesia Transfer of Care Note  Patient: Jill Dunlap  Procedure(s) Performed: Procedure(s): CATARACT EXTRACTION PHACO AND INTRAOCULAR LENS PLACEMENT (IOC)  Left (Left)  Patient Location: PACU  Anesthesia Type: MAC  Level of Consciousness: awake, alert  and patient cooperative  Airway and Oxygen Therapy: Patient Spontanous Breathing and Patient connected to supplemental oxygen  Post-op Assessment: Post-op Vital signs reviewed, Patient's Cardiovascular Status Stable, Respiratory Function Stable, Patent Airway and No signs of Nausea or vomiting  Post-op Vital Signs: Reviewed and stable  Complications: No apparent anesthesia complications

## 2017-02-04 NOTE — Op Note (Signed)
OPERATIVE NOTE  Jill Dunlap [id number] 02/04/2017   PREOPERATIVE DIAGNOSIS:  Nuclear sclerotic cataract left eye. H25.12   POSTOPERATIVE DIAGNOSIS:    Nuclear sclerotic cataract left eye.     PROCEDURE:  Phacoemusification with posterior chamber intraocular lens placement of the left eye   LENS:   Implant Name Type Inv. Item Serial No. Manufacturer Lot No. LRB No. Used  LENS IOL DIOP 20.5 - [id number] Intraocular Lens LENS IOL DIOP 20.5 [id number]   Left 1        ULTRASOUND TIME: 17  % of 1 minutes 10 seconds, CDE 11.9  SURGEON:  Wyonia Hough, MD   ANESTHESIA:  Topical with tetracaine drops and 2% Xylocaine jelly, augmented with 1% preservative-free intracameral lidocaine.    COMPLICATIONS:  None.   DESCRIPTION OF PROCEDURE:  The patient was identified in the holding room and transported to the operating room and placed in the supine position under the operating microscope.  The left eye was identified as the operative eye and it was prepped and draped in the usual sterile ophthalmic fashion.   A 1 millimeter clear-corneal paracentesis was made at the 1:30 position.  0.5 ml of preservative-free 1% lidocaine was injected into the anterior chamber.  The anterior chamber was filled with Viscoat viscoelastic.  A 2.4 millimeter keratome was used to make a near-clear corneal incision at the 10:30 position.  .  A curvilinear capsulorrhexis was made with a cystotome and capsulorrhexis forceps.  Balanced salt solution was used to hydrodissect and hydrodelineate the nucleus.   Phacoemulsification was then used in stop and chop fashion to remove the lens nucleus and epinucleus.  The remaining cortex was then removed using the irrigation and aspiration handpiece. Provisc was then placed into the capsular bag to distend it for lens placement.  A lens was then injected into the capsular bag.  The remaining viscoelastic was aspirated.   Wounds were hydrated with balanced salt  solution.  The anterior chamber was inflated to a physiologic pressure with balanced salt solution.  No wound leaks were noted. Cefuroxime 0.1 ml of a 42m/ml solution was injected into the anterior chamber for a dose of 1 mg of intracameral antibiotic at the completion of the case.   Timolol and Brimonidine drops were applied to the eye.  The patient was taken to the recovery room in stable condition without complications of anesthesia or surgery.  [name] 02/04/2017, 11:18 AM

## 2017-02-05 ENCOUNTER — Encounter: Payer: Self-pay | Admitting: Ophthalmology

## 2017-02-18 ENCOUNTER — Other Ambulatory Visit: Payer: Self-pay | Admitting: Internal Medicine

## 2017-03-14 ENCOUNTER — Other Ambulatory Visit: Payer: Self-pay | Admitting: Internal Medicine

## 2017-04-27 ENCOUNTER — Encounter: Payer: Self-pay | Admitting: Internal Medicine

## 2017-04-27 ENCOUNTER — Ambulatory Visit (INDEPENDENT_AMBULATORY_CARE_PROVIDER_SITE_OTHER): Payer: Medicare Other | Admitting: Internal Medicine

## 2017-04-27 VITALS — BP 120/76 | HR 80 | Ht 65.0 in | Wt 163.0 lb

## 2017-04-27 DIAGNOSIS — Z23 Encounter for immunization: Secondary | ICD-10-CM | POA: Diagnosis not present

## 2017-04-27 DIAGNOSIS — M62830 Muscle spasm of back: Secondary | ICD-10-CM | POA: Diagnosis not present

## 2017-04-27 DIAGNOSIS — I10 Essential (primary) hypertension: Secondary | ICD-10-CM

## 2017-04-27 DIAGNOSIS — Z1231 Encounter for screening mammogram for malignant neoplasm of breast: Secondary | ICD-10-CM | POA: Diagnosis not present

## 2017-04-27 MED ORDER — FLUTICASONE PROPIONATE 50 MCG/ACT NA SUSP: 2.0000 | Freq: Every day | NASAL | 3 refills | Status: DC

## 2017-04-27 MED ORDER — AZELASTINE HCL 0.1 % NA SOLN: 2.0000 | Freq: Two times a day (BID) | NASAL | 3 refills | Status: DC

## 2017-04-27 NOTE — Progress Notes (Signed)
Date:  04/27/2017   Name:  Jill Dunlap   DOB:  Nov 21, 1934   MRN:  [id number]   Chief Complaint: Immunizations (high dose) and Back Pain (Lower left back pain- 2 weeks. Much better than was but wants to discuss back pain. Bothers her when turning torso. When starting out would take breathe. )  Back Pain  This is a new problem. The current episode started 1 to 4 weeks ago. The problem has been rapidly improving since onset. The quality of the pain is described as aching. Pertinent negatives include no abdominal pain, dysuria, numbness, weakness or weight loss. She has tried NSAIDs for the symptoms. The treatment provided significant relief.  Hypertension  This is a chronic problem. The problem is controlled. Past treatments include ACE inhibitors and diuretics. The current treatment provides significant improvement.  Today she is much better with very minimal discomfort. She is just unsure how much Aleve she can take as it causes significant constipation. She was told in the past not to take Aleve or ibuprofen. She is apparently due to a history of mild gastritis. She denies history of actual ulcer or GI bleed.   Review of Systems  Constitutional: Negative for weight loss.  Gastrointestinal: Negative for abdominal pain.  Genitourinary: Negative for dysuria.  Musculoskeletal: Positive for back pain.  Neurological: Negative for weakness and numbness.    Patient Active Problem List   Diagnosis Date Noted  . Diffuse photodamage of skin 02/13/2016  . Basal cell carcinoma 01/11/2016  . Gastroesophageal reflux disease without esophagitis 07/03/2015  . Prediabetes 03/15/2015  . Sprain of rotator cuff capsule 01/11/2015  . Acquired hypothyroidism 10/31/2014  . Allergic rhinitis 10/31/2014  . Benign hypertension 10/31/2014  . CD (celiac disease) 10/31/2014  . Dyslipidemia 10/31/2014  . OP (osteoporosis) 10/31/2014    Prior to Admission medications   Medication Sig Start Date End  Date Taking? Authorizing Provider  alendronate (FOSAMAX) 70 MG tablet Take 1 tablet (70 mg total) by mouth once a week. 10/29/16  Yes Glean Hess, MD  azelastine (ASTELIN) 0.1 % nasal spray USE 2 SPRAYS IN EACH NOSTRIL TWICE DAILY AS DIRECTED 03/15/17  Yes Glean Hess, MD  Biotin 2500 MCG CAPS Take 1 capsule by mouth daily.   Yes [provider]  Calcium-Vitamin D-Vitamin K 2514821272-40 MG-UNT-MCG CHEW Chew 1 tablet by mouth daily.   Yes [provider]  Cranberry 500 MG CAPS Take by mouth.   Yes [provider]  fexofenadine (ALLEGRA) 180 MG tablet Take 1 tablet by mouth daily.   Yes [provider]  fluticasone (FLONASE) 50 MCG/ACT nasal spray Place into both nostrils daily.   Yes [provider]  Glucosamine-Chondroitin (MOVE FREE PO) Take by mouth daily.   Yes [provider]  levothyroxine (SYNTHROID, LEVOTHROID) 50 MCG tablet TAKE 1 TABLET(50 MCG) BY MOUTH DAILY 02/19/17  Yes Glean Hess, MD  lisinopril-hydrochlorothiazide (PRINZIDE,ZESTORETIC) 10-12.5 MG tablet TAKE 1 TABLET BY MOUTH DAILY 02/19/17  Yes Glean Hess, MD  Multiple Vitamins-Minerals (CENTRUM SILVER PO) Take by mouth daily.   Yes [provider]  Omega-3 Fatty Acids (FISH OIL) 1000 MG CPDR Take 1 capsule by mouth daily.   Yes [provider]  Probiotic Product (PROBIOTIC ADVANCED PO) Take by mouth.   Yes [provider]  ranitidine (ZANTAC) 150 MG tablet Take 150 mg by mouth 2 (two) times daily.   Yes [provider]  simvastatin (ZOCOR) 20 MG tablet Take 1 tablet (  20 mg total) by mouth at bedtime. 10/29/16  Yes Glean Hess, MD  TURMERIC PO Take by mouth.   Yes [provider]    Allergies  Allergen Reactions  . Ciprofloxacin Hcl Rash    Past Surgical History:  Procedure Laterality Date  . ABDOMINAL HYSTERECTOMY  1984   for bleeding  . Basil cell    . BILATERAL SALPINGOOPHORECTOMY  1984  . CATARACT  EXTRACTION W/PHACO Right 11/24/2016   Procedure: CATARACT EXTRACTION PHACO AND INTRAOCULAR LENS PLACEMENT ([place]) Right;  Surgeon: Leandrew Koyanagi, MD;  Location: [facility];  Service: Ophthalmology;  Laterality: Right;  . CATARACT EXTRACTION W/PHACO Left 02/04/2017   Procedure: CATARACT EXTRACTION PHACO AND INTRAOCULAR LENS PLACEMENT ([place])  Left;  Surgeon: Leandrew Koyanagi, MD;  Location: [facility];  Service: Ophthalmology;  Laterality: Left;  . ORIF WRIST FRACTURE Left 2015  . TYMPANOPLASTY  1992    Social History  Substance Use Topics  . Smoking status: Never Smoker  . Smokeless tobacco: Never Used  . Alcohol use No     Medication list has been reviewed and updated.  PHQ 2/9 Scores 10/29/2016 03/07/2016 01/01/2015  PHQ - 2 Score 0 0 0    Physical Exam  Constitutional: She is oriented to person, place, and time. She appears well-developed. No distress.  HENT:  Head: Normocephalic and atraumatic.  Cardiovascular: Normal rate, regular rhythm and normal heart sounds.   Pulmonary/Chest: Effort normal and breath sounds normal. No respiratory distress. She has no wheezes.  Musculoskeletal: Normal range of motion.       Right hip: Normal.       Left hip: Normal.       Lumbar back: She exhibits normal range of motion and no tenderness.  Neurological: She is alert and oriented to person, place, and time.  Skin: Skin is warm and dry. No rash noted.  Psychiatric: She has a normal mood and affect. Her behavior is normal. Thought content normal.  Nursing note and vitals reviewed.   BP 120/76   Pulse 80   Ht 5' 5"  (1.651 m)   Wt 163 lb (73.9 kg)   SpO2 97%   BMI 27.12 kg/m   Assessment and Plan: 1. Spasm of thoracic back muscle Improved with conservative treatment May use Aleve sparingly with stool softener if needed  2. Benign hypertension controlled  3. Need for influenza vaccination - Flu vaccine HIGH DOSE PF  4. Encounter for screening mammogram  for breast cancer - MM DIGITAL SCREENING BILATERAL; Future   Meds ordered this encounter  Medications  . fluticasone (FLONASE) 50 MCG/ACT nasal spray    Sig: Place 2 sprays into both nostrils daily.    Dispense:  48 g    Refill:  3  . azelastine (ASTELIN) 0.1 % nasal spray    Sig: Place 2 sprays into both nostrils 2 (two) times daily. Use in each nostril as directed    Dispense:  90 mL    Refill:  3    Partially dictated using Editor, commissioning. Any errors are unintentional.  Halina Maidens, MD [facility]  04/27/2017

## 2017-05-13 ENCOUNTER — Other Ambulatory Visit: Payer: Self-pay | Admitting: Internal Medicine

## 2017-06-30 ENCOUNTER — Other Ambulatory Visit: Payer: Self-pay

## 2017-06-30 NOTE — Patient Outreach (Signed)
[facility]) Care Management  06/30/2017  Pascale Maves 08/02/1934 [id number]   Medication Adherence call to Mrs. Vastie Douty patient is showing past due under [facility]. On Simvastatin 20 mg spoke with patient she said doctor is given her samples and she has medication at this time.  [place] Management Direct Dial [phone]  Fax [phone] [email or web address]

## 2017-07-08 ENCOUNTER — Ambulatory Visit
Admission: RE | Admit: 2017-07-08 | Discharge: 2017-07-08 | Disposition: A | Discharge status: Home / Self Care | Payer: Medicare Other | Source: Ambulatory Visit | Attending: Internal Medicine | Admitting: Internal Medicine

## 2017-07-08 DIAGNOSIS — Z1231 Encounter for screening mammogram for malignant neoplasm of breast: Secondary | ICD-10-CM | POA: Diagnosis present

## 2017-10-09 LAB — HM DIABETES EYE EXAM

## 2017-11-02 ENCOUNTER — Ambulatory Visit (INDEPENDENT_AMBULATORY_CARE_PROVIDER_SITE_OTHER): Payer: Medicare Other

## 2017-11-02 VITALS — BP 126/70 | HR 68 | Temp 98.0°F | Ht 65.0 in | Wt 166.4 lb

## 2017-11-02 DIAGNOSIS — Z0000 Encounter for general adult medical examination without abnormal findings: Secondary | ICD-10-CM

## 2017-11-02 NOTE — Progress Notes (Addendum)
Subjective:   Jill Dunlap is a 83 y.o. female who presents for Medicare Annual (Subsequent) preventive examination.  Review of Systems:  N/A Cardiac Risk Factors include: advanced age (>30mn, >>72women);dyslipidemia;hypertension;sedentary lifestyle     Objective:     Vitals: BP 126/70 (BP Location: Right Arm, Patient Position: Sitting, Cuff Size: Normal)   Pulse 68   Temp 98 F (36.7 C) (Oral)   Ht 5' 5"  (1.651 m)   Wt 166 lb 6.4 oz (75.5 kg)   SpO2 93%   BMI 27.69 kg/m   Body mass index is 27.69 kg/m.  Advanced Directives 11/02/2017 02/04/2017 11/24/2016 10/29/2016  Does Patient Have a Medical Advance Directive? Yes Yes Yes Yes  Type of AParamedicof AGreenLiving will [place]Living will [place] will Living will  Does patient want to make changes to medical advance directive? Yes (MAU/Ambulatory/Procedural Areas - Information given) - No - Patient declined -  Copy of HGansin Chart? No - copy requested - No - copy requested -    Tobacco Social History   Tobacco Use  Smoking Status Never Smoker  Smokeless Tobacco Never Used  Tobacco Comment   smoking cessation materials not required     Counseling given: No Comment: smoking cessation materials not required  Clinical Intake:  Pre-visit preparation completed: Yes  Pain : No/denies pain   BMI - recorded: 27.69 Nutritional Status: BMI 25 -29 Overweight Nutritional Risks: None Diabetes: No  How often do you need to have someone help you when you read instructions, pamphlets, or other written materials from your doctor or pharmacy?: 1 - Never  Interpreter Needed?: No  Information entered by :: AEversole, LPN  Past Medical History:  Diagnosis Date  . Cancer ([facility] [place]    skin ca on leg head and back  . Celiac disease   . Hypertension   . Hypothyroidism   . Pre-diabetes   . Vertigo    no episodes in several  years  . Wears dentures    partial lower   Past Surgical History:  Procedure Laterality Date  . ABDOMINAL HYSTERECTOMY  1984   for bleeding  . Basil cell    . BILATERAL SALPINGOOPHORECTOMY  1984  . CATARACT EXTRACTION W/PHACO Right 11/24/2016   Procedure: CATARACT EXTRACTION PHACO AND INTRAOCULAR LENS PLACEMENT ([place] Right;  Surgeon: BLeandrew Koyanagi MD;  [facility]  Service: Ophthalmology;  Laterality: Right;  . CATARACT EXTRACTION W/PHACO Left 02/04/2017   Procedure: CATARACT EXTRACTION PHACO AND INTRAOCULAR LENS PLACEMENT ([place]  Left;  Surgeon: BLeandrew Koyanagi MD;  [facility]  Service: Ophthalmology;  Laterality: Left;  . ORIF WRIST FRACTURE Left 2015  . TYMPANOPLASTY  1992   Family History  Problem Relation Age of Onset  . Cancer Mother        oral  . Hypertension Father   . Heart disease Father   . Breast cancer Neg Hx    Social History   Socioeconomic History  . Marital status: Married    Spouse name: BAudiological scientist . Number of children: 0  . Years of education: Not on file  . Highest education level: Bachelor's degree (e.g., BA, AB, BS)  Occupational History  . Occupation: Retired  SScientific laboratory technician . Financial resource strain: Not hard at all  . Food insecurity:    Worry: Never true    Inability: Never true  . Transportation needs:    Medical: No  Non-medical: No  Tobacco Use  . Smoking status: Never Smoker  . Smokeless tobacco: Never Used  . Tobacco comment: smoking cessation materials not required  Substance and Sexual Activity  . Alcohol use: No    Alcohol/week: 0.0 oz  . Drug use: No  . Sexual activity: Not Currently  Lifestyle  . Physical activity:    Days per week: 0 days    Minutes per session: 0 min  . Stress: Not at all  Relationships  . Social connections:    Talks on phone: Patient refused    Gets together: Patient refused    Attends religious service: Patient refused    Active member of club or  organization: Patient refused    Attends meetings of clubs or organizations: Patient refused    Relationship status: Married  Other Topics Concern  . Not on file  Social History Narrative  . Not on file    Outpatient Encounter Medications as of 11/02/2017  Medication Sig  . alendronate (FOSAMAX) 70 MG tablet Take 1 tablet (70 mg total) by mouth once a week.  Marland Kitchen azelastine (ASTELIN) 0.1 % nasal spray Place 2 sprays into both nostrils 2 (two) times daily. Use in each nostril as directed  . Biotin 2500 MCG CAPS Take 1 capsule by mouth daily.  . Calcium-Vitamin D-Vitamin K [id number] MG-UNT-MCG CHEW Chew 1 tablet by mouth daily.  . Cranberry 500 MG CAPS Take 1 capsule by mouth daily.   . fexofenadine (ALLEGRA) 180 MG tablet Take 1 tablet by mouth daily.  . fluticasone (FLONASE) 50 MCG/ACT nasal spray Place 2 sprays into both nostrils daily.  . Glucosamine-Chondroitin (MOVE FREE PO) Take by mouth daily.  Marland Kitchen levothyroxine (SYNTHROID, LEVOTHROID) 50 MCG tablet TAKE 1 TABLET(50 MCG) BY MOUTH DAILY  . lisinopril-hydrochlorothiazide (PRINZIDE,ZESTORETIC) 10-12.5 MG tablet TAKE 1 TABLET BY MOUTH DAILY  . Multiple Vitamins-Minerals (CENTRUM SILVER PO) Take by mouth daily.  . Omega-3 Fatty Acids (FISH OIL) 1000 MG CPDR Take 1 capsule by mouth daily.  . Probiotic Product (PROBIOTIC ADVANCED PO) Take 1 tablet by mouth daily as needed.   . ranitidine (ZANTAC) 150 MG tablet Take 150 mg by mouth 2 (two) times daily.  . simvastatin (ZOCOR) 20 MG tablet Take 1 tablet (20 mg total) by mouth at bedtime.  . TURMERIC PO Take 1 tablet by mouth daily.    No facility-administered encounter medications on file as of 11/02/2017.     Activities of Daily Living In your present state of health, do you have any difficulty performing the following activities: 11/02/2017 02/04/2017  Hearing? N N  Comment B hearing aids -  Vision? N N  Comment wears eyeglasses -  Difficulty concentrating or making decisions? N N    Walking or climbing stairs? N N  Dressing or bathing? N N  Doing errands, shopping? N -  Preparing Food and eating ? N -  Comment lower partial dentures -  Using the Toilet? N -  In the past six months, have you accidently leaked urine? N -  Do you have problems with loss of bowel control? N -  Managing your Medications? N -  Managing your Finances? N -  Housekeeping or managing your Housekeeping? N -  Some recent data might be hidden    Patient Care Team: Glean Hess, MD as PCP - General (Family Medicine) Lucilla Lame, MD as Consulting Physician (Gastroenterology)    Assessment:   This is a routine wellness examination for Molleigh.  Exercise Activities and  Dietary recommendations Current Exercise Habits: The patient does not participate in regular exercise at present, Exercise limited by: None identified  Goals    . DIET - INCREASE WATER INTAKE     Recommend to drink at least 6-8 8oz glasses of water per day.       Fall Risk Fall Risk  11/02/2017 10/29/2016 03/07/2016 01/01/2015  Falls in the past year? No No No No  Risk for fall due to : Impaired vision - - -  Risk for fall due to: Comment wears eyeglasses - - -   Is the home free of loose throw rugs in walkways, pet beds, electrical cords, etc? Yes Adequate lighting to reduce risk of falls?  Yes In addition, does the patient have any of the following: Stairs in or around the home WITH handrails? No Grab bars in the bathroom? Yes  Shower chair or a place to sit while bathing? Yes Use of an elevated toilet seat or a handicapped toilet? Yes Use of a cane, walker or w/c? No  Timed Get Up and Go Performed: Yes. Pt ambulated 10 feet within 5 sec. Gait stead-fast and without the use of an assistive device. No intervention required at this time. Fall risk prevention has been discussed.  Community Resource Referral not required at this time.  Depression Screen PHQ 2/9 Scores 11/02/2017 10/29/2016 03/07/2016 01/01/2015   PHQ - 2 Score 0 0 0 0  PHQ- 9 Score 0 - - -     Cognitive Function     6CIT Screen 11/02/2017 10/29/2016  What Year? 0 points 0 points  What month? 0 points 0 points  What time? 0 points 0 points  Count back from 20 0 points 0 points  Months in reverse 0 points 0 points  Repeat phrase 2 points 0 points  Total Score 2 0    Immunization History  Administered Date(s) Administered  . Influenza, High Dose Seasonal PF 04/27/2017  . Influenza-Unspecified 04/27/2015  . Pneumococcal Conjugate-13 06/29/2014  . Pneumococcal Polysaccharide-23 10/30/2008  . Td 10/30/2008  . Zoster 10/30/2008    Qualifies for Shingles Vaccine? Yes. Zostavax completed 10/30/08. Due for Shingrix vaccine. Education has been provided regarding the importance of this vaccine. Pt has been advised to call her insurance company to determine her out of pocket expense. Advised she may also receive this vaccine at her local pharmacy or Health Dept. Verbalized acceptance and understanding.  Screening Tests Health Maintenance  Topic Date Due  . INFLUENZA VACCINE  02/18/2018  . MAMMOGRAM  07/08/2018  . TETANUS/TDAP  10/31/2018  . DEXA SCAN  Completed  . PNA vac Low Risk Adult  Completed    Cancer Screenings: Lung: Low Dose CT Chest recommended if Age 79-80 years, 30 pack-year currently smoking OR have quit w/in 15years. Patient does not qualify. Breast:  Up to date on Mammogram? Yes. Completed 07/08/17. Repeat every year.   Up to date of Bone Density/Dexa? Yes. Completed 11/12/16. Osteoporotic screenings no longer required. Colorectal: Completed colonoscopy 03/26/10. Repeat screening no longer required  Additional Screenings: Hepatitis C Screening: Does not qualify     Plan:  I have personally reviewed and addressed the Medicare Annual Wellness questionnaire and have noted the following in the patient's chart:  A. Medical and social history B. Use of alcohol, tobacco or illicit drugs  C. Current medications and  supplements D. Functional ability and status E.  Nutritional status F.  Physical activity G. Advance directives H. List of other physicians I.  Hospitalizations,  surgeries, and ER visits in previous 12 months J.  [place] such as hearing and vision if needed, cognitive and depression L. Referrals and appointments  In addition, I have reviewed and discussed with patient certain preventive protocols, quality metrics, and best practice recommendations. A written personalized care plan for preventive services as well as general preventive health recommendations were provided to patient.  Signed,  Aleatha Borer, LPN Nurse Health Advisor  MD Recommendations: Zostavax completed 10/30/08. Due for Shingrix vaccine. Education has been provided regarding the importance of this vaccine. Pt has been advised to call her insurance company to determine her out of pocket expense. Advised she may also receive this vaccine at her local pharmacy or Health Dept. Verbalized acceptance and understanding.  I have reviewed the health advisor's note, was available for consultation, and agree with documentation and plan.  Halina Maidens, MD

## 2017-11-02 NOTE — Patient Instructions (Signed)
Jill Dunlap , Thank you for taking time to come for your Medicare Wellness Visit. I appreciate your ongoing commitment to your health goals. Please review the following plan we discussed and let me know if I can assist you in the future.   Screening recommendations/referrals: Colorectal Screening: Completed colonoscopy 03/26/10. Repeat screening no longer required Mammogram: Completed 07/08/17. Repeat every year. Bone Density: Completed 11/12/16. Osteoporotic screenings no longer required.  Vision and Dental Exams: Recommended annual ophthalmology exams for early detection of glaucoma and other disorders of the eye Recommended annual dental exams for proper oral hygiene  Vaccinations: Influenza vaccine: Up to date Pneumococcal vaccine: Completed series Tdap vaccine: Up to date Shingles vaccine: Please call your insurance company to determine your out of pocket expense for the Shingrix vaccine. You may also receive this vaccine at your local pharmacy or Health Dept.   Advanced directives: Please bring a copy of your POA (Power of Attorney) and/or Living Will to your next appointment.  Conditions/risks identified: Recommend to drink at least 6-8 8oz glasses of water per day.  Next appointment: Please schedule your Annual Wellness Visit with your Nurse Health Advisor in one year.  Preventive Care 18 Years and Older, Female Preventive care refers to lifestyle choices and visits with your health care provider that can promote health and wellness. What does preventive care include?  A yearly physical exam. This is also called an annual well check.  Dental exams once or twice a year.  Routine eye exams. Ask your health care provider how often you should have your eyes checked.  Personal lifestyle choices, including:  Daily care of your teeth and gums.  Regular physical activity.  Eating a healthy diet.  Avoiding tobacco and drug use.  Limiting alcohol use.  Practicing safe  sex.  Taking low-dose aspirin every day.  Taking vitamin and mineral supplements as recommended by your health care provider. What happens during an annual well check? The services and screenings done by your health care provider during your annual well check will depend on your age, overall health, lifestyle risk factors, and family history of disease. Counseling  Your health care provider may ask you questions about your:  Alcohol use.  Tobacco use.  Drug use.  Emotional well-being.  Home and relationship well-being.  Sexual activity.  Eating habits.  History of falls.  Memory and ability to understand (cognition).  Work and work Statistician.  Reproductive health. Screening  You may have the following tests or measurements:  Height, weight, and BMI.  Blood pressure.  Lipid and cholesterol levels. These may be checked every 5 years, or more frequently if you are over 80 years old.  Skin check.  Lung cancer screening. You may have this screening every year starting at age 36 if you have a 30-pack-year history of smoking and currently smoke or have quit within the past 15 years.  Fecal occult blood test (FOBT) of the stool. You may have this test every year starting at age 11.  Flexible sigmoidoscopy or colonoscopy. You may have a sigmoidoscopy every 5 years or a colonoscopy every 10 years starting at age 15.  Hepatitis C blood test.  Hepatitis B blood test.  Sexually transmitted disease (STD) testing.  Diabetes screening. This is done by checking your blood sugar (glucose) after you have not eaten for a while (fasting). You may have this done every 1-3 years.  Bone density scan. This is done to screen for osteoporosis. You may have this done starting at  age 36.  Mammogram. This may be done every 1-2 years. Talk to your health care provider about how often you should have regular mammograms. Talk with your health care provider about your test results,  treatment options, and if necessary, the need for more tests. Vaccines  Your health care provider may recommend certain vaccines, such as:  Influenza vaccine. This is recommended every year.  Tetanus, diphtheria, and acellular pertussis (Tdap, Td) vaccine. You may need a Td booster every 10 years.  Zoster vaccine. You may need this after age 44.  Pneumococcal 13-valent conjugate (PCV13) vaccine. One dose is recommended after age 5.  Pneumococcal polysaccharide (PPSV23) vaccine. One dose is recommended after age 74. Talk to your health care provider about which screenings and vaccines you need and how often you need them. This information is not intended to replace advice given to you by your health care provider. Make sure you discuss any questions you have with your health care provider. Document Released: 08/03/2015 Document Revised: 03/26/2016 Document Reviewed: 05/08/2015 Elsevier Interactive Patient Education  2017 [place] Prevention in the Home Falls can cause injuries. They can happen to people of all ages. There are many things you can do to make your home safe and to help prevent falls. What can I do on the outside of my home?  Regularly fix the edges of walkways and driveways and fix any cracks.  Remove anything that might make you trip as you walk through a door, such as a raised step or threshold.  Trim any bushes or trees on the path to your home.  Use bright outdoor lighting.  Clear any walking paths of anything that might make someone trip, such as rocks or tools.  Regularly check to see if handrails are loose or broken. Make sure that both sides of any steps have handrails.  Any raised decks and porches should have guardrails on the edges.  Have any leaves, snow, or ice cleared regularly.  Use sand or salt on walking paths during winter.  Clean up any spills in your garage right away. This includes oil or grease spills. What can I do in the  bathroom?  Use night lights.  Install grab bars by the toilet and in the tub and shower. Do not use towel bars as grab bars.  Use non-skid mats or decals in the tub or shower.  If you need to sit down in the shower, use a plastic, non-slip stool.  Keep the floor dry. Clean up any water that spills on the floor as soon as it happens.  Remove soap buildup in the tub or shower regularly.  Attach bath mats securely with double-sided non-slip rug tape.  Do not have throw rugs and other things on the floor that can make you trip. What can I do in the bedroom?  Use night lights.  Make sure that you have a light by your bed that is easy to reach.  Do not use any sheets or blankets that are too big for your bed. They should not hang down onto the floor.  Have a firm chair that has side arms. You can use this for support while you get dressed.  Do not have throw rugs and other things on the floor that can make you trip. What can I do in the kitchen?  Clean up any spills right away.  Avoid walking on wet floors.  Keep items that you use a lot in easy-to-reach places.  If you  need to reach something above you, use a strong step stool that has a grab bar.  Keep electrical cords out of the way.  Do not use floor polish or wax that makes floors slippery. If you must use wax, use non-skid floor wax.  Do not have throw rugs and other things on the floor that can make you trip. What can I do with my stairs?  Do not leave any items on the stairs.  Make sure that there are handrails on both sides of the stairs and use them. Fix handrails that are broken or loose. Make sure that handrails are as long as the stairways.  Check any carpeting to make sure that it is firmly attached to the stairs. Fix any carpet that is loose or worn.  Avoid having throw rugs at the top or bottom of the stairs. If you do have throw rugs, attach them to the floor with carpet tape.  Make sure that you have a  light switch at the top of the stairs and the bottom of the stairs. If you do not have them, ask someone to add them for you. What else can I do to help prevent falls?  Wear shoes that:  Do not have high heels.  Have rubber bottoms.  Are comfortable and fit you well.  Are closed at the toe. Do not wear sandals.  If you use a stepladder:  Make sure that it is fully opened. Do not climb a closed stepladder.  Make sure that both sides of the stepladder are locked into place.  Ask someone to hold it for you, if possible.  Clearly mark and make sure that you can see:  Any grab bars or handrails.  First and last steps.  Where the edge of each step is.  Use tools that help you move around (mobility aids) if they are needed. These include:  Canes.  Walkers.  Scooters.  Crutches.  Turn on the lights when you go into a dark area. Replace any light bulbs as soon as they burn out.  Set up your furniture so you have a clear path. Avoid moving your furniture around.  If any of your floors are uneven, fix them.  If there are any pets around you, be aware of where they are.  Review your medicines with your doctor. Some medicines can make you feel dizzy. This can increase your chance of falling. Ask your doctor what other things that you can do to help prevent falls. This information is not intended to replace advice given to you by your health care provider. Make sure you discuss any questions you have with your health care provider. Document Released: 05/03/2009 Document Revised: 12/13/2015 Document Reviewed: 08/11/2014 Elsevier Interactive Patient Education  2017 [employer].

## 2017-11-17 ENCOUNTER — Encounter: Payer: Self-pay | Admitting: Internal Medicine

## 2017-11-17 ENCOUNTER — Ambulatory Visit (INDEPENDENT_AMBULATORY_CARE_PROVIDER_SITE_OTHER): Payer: Medicare Other | Admitting: Internal Medicine

## 2017-11-17 VITALS — BP 121/80 | HR 78 | Resp 16 | Ht 65.0 in | Wt 164.0 lb

## 2017-11-17 DIAGNOSIS — Z0000 Encounter for general adult medical examination without abnormal findings: Secondary | ICD-10-CM

## 2017-11-17 DIAGNOSIS — E039 Hypothyroidism, unspecified: Secondary | ICD-10-CM | POA: Diagnosis not present

## 2017-11-17 DIAGNOSIS — R7303 Prediabetes: Secondary | ICD-10-CM | POA: Diagnosis not present

## 2017-11-17 DIAGNOSIS — E785 Hyperlipidemia, unspecified: Secondary | ICD-10-CM

## 2017-11-17 DIAGNOSIS — K900 Celiac disease: Secondary | ICD-10-CM | POA: Diagnosis not present

## 2017-11-17 DIAGNOSIS — K219 Gastro-esophageal reflux disease without esophagitis: Secondary | ICD-10-CM

## 2017-11-17 DIAGNOSIS — I10 Essential (primary) hypertension: Secondary | ICD-10-CM

## 2017-11-17 DIAGNOSIS — Z0001 Encounter for general adult medical examination with abnormal findings: Secondary | ICD-10-CM

## 2017-11-17 DIAGNOSIS — R000 Tachycardia, unspecified: Secondary | ICD-10-CM

## 2017-11-17 LAB — POC URINALYSIS WITH MICROSCOPIC (NON AUTO)MANUAL RESULT
Bilirubin, UA: NEGATIVE
CRYSTALS: 0
EPITHELIAL CELLS, URINE PER MICROSCOPY: 0
Glucose, UA: NEGATIVE
KETONES UA: NEGATIVE
MUCUS UA: 0
NITRITE UA: POSITIVE
PH UA: 6 (ref 5.0–8.0)
PROTEIN UA: NEGATIVE
RBC: 0 M/uL — AB (ref 4.04–5.48)
SPEC GRAV UA: 1.01 (ref 1.010–1.025)
Urobilinogen, UA: 0.2 E.U./dL
WBC CASTS UA: 20

## 2017-11-17 NOTE — Progress Notes (Signed)
Date:  11/17/2017   Name:  Jill Dunlap   DOB:  19-Mar-1935   MRN:  [id number]   Chief Complaint: Annual Exam Jill Dunlap is a 83 y.o. female who presents today for her Complete Annual Exam. She feels well. She reports exercising some. She reports she is sleeping well. Mammogram was done in December.  She occasionally has pain in her left axilla. She follows a healthy, low fat, low gluten diet.    Hypertension  This is a chronic problem. The problem is controlled. Associated symptoms include palpitations (occasional rapid heart rate). Pertinent negatives include no chest pain, headaches or shortness of breath. Past treatments include ACE inhibitors and diuretics. Identifiable causes of hypertension include a thyroid problem.  Hyperlipidemia  This is a chronic problem. The problem is controlled. Pertinent negatives include no chest pain or shortness of breath. Current antihyperlipidemic treatment includes statins. The current treatment provides significant improvement of lipids. There are no compliance problems.   Gastroesophageal Reflux  She complains of heartburn. She reports no abdominal pain, no chest pain, no coughing or no wheezing. This is a recurrent problem. The problem occurs rarely. Pertinent negatives include no fatigue. She has tried a histamine-2 antagonist for the symptoms.  Thyroid Problem  Presents for follow-up visit. Symptoms include palpitations (occasional rapid heart rate). Patient reports no anxiety, constipation, diarrhea, fatigue or tremors. The symptoms have been stable. Her past medical history is significant for hyperlipidemia.  Celiac Disease - continues to follow a gluten free diet.  Bowel symptoms are stable.  Tachycardia - has episodes of rapid heart rate lasting only a few minutes about once a month.  Not associated with chest pain, SOB or syncope.  She has never had an EKG.  No cardiac workup.   Review of Systems  Constitutional: Negative for  chills, fatigue and fever.  HENT: Negative for congestion, hearing loss, tinnitus, trouble swallowing and voice change.   Eyes: Negative for visual disturbance.  Respiratory: Negative for cough, chest tightness, shortness of breath and wheezing.   Cardiovascular: Positive for palpitations (occasional rapid heart rate). Negative for chest pain and leg swelling.  Gastrointestinal: Positive for heartburn. Negative for abdominal pain, constipation, diarrhea and vomiting.  Endocrine: Negative for polydipsia and polyuria.  Genitourinary: Negative for dysuria, frequency, genital sores, vaginal bleeding and vaginal discharge.  Musculoskeletal: Negative for arthralgias, gait problem and joint swelling.  Skin: Negative for color change and rash.  Neurological: Negative for dizziness, tremors, light-headedness and headaches.  Hematological: Negative for adenopathy. Does not bruise/bleed easily.  Psychiatric/Behavioral: Negative for dysphoric mood and sleep disturbance. The patient is not nervous/anxious.     Patient Active Problem List   Diagnosis Date Noted  . Diffuse photodamage of skin 02/13/2016  . Basal cell carcinoma 01/11/2016  . Gastroesophageal reflux disease without esophagitis 07/03/2015  . Prediabetes 03/15/2015  . Sprain of rotator cuff capsule 01/11/2015  . Acquired hypothyroidism 10/31/2014  . Allergic rhinitis 10/31/2014  . Benign hypertension 10/31/2014  . CD (celiac disease) 10/31/2014  . Dyslipidemia 10/31/2014  . OP (osteoporosis) 10/31/2014    Prior to Admission medications   Medication Sig Start Date End Date Taking? Authorizing Provider  alendronate (FOSAMAX) 70 MG tablet Take 1 tablet (70 mg total) by mouth once a week. 10/29/16   Glean Hess, MD  azelastine (ASTELIN) 0.1 % nasal spray Place 2 sprays into both nostrils 2 (two) times daily. Use in each nostril as directed 04/27/17   Glean Hess, MD  Biotin  2500 MCG CAPS Take 1 capsule by mouth daily.     [provider]  Calcium-Vitamin D-Vitamin K [id number] MG-UNT-MCG CHEW Chew 1 tablet by mouth daily.    [provider]  Cranberry 500 MG CAPS Take 1 capsule by mouth daily.     [provider]  fexofenadine (ALLEGRA) 180 MG tablet Take 1 tablet by mouth daily.    [provider]  fluticasone (FLONASE) 50 MCG/ACT nasal spray Place 2 sprays into both nostrils daily. 04/27/17   Glean Hess, MD  Glucosamine-Chondroitin (MOVE FREE PO) Take by mouth daily.    [provider]  levothyroxine (SYNTHROID, LEVOTHROID) 50 MCG tablet TAKE 1 TABLET(50 MCG) BY MOUTH DAILY 05/13/17   Glean Hess, MD  lisinopril-hydrochlorothiazide (PRINZIDE,ZESTORETIC) 10-12.5 MG tablet TAKE 1 TABLET BY MOUTH DAILY 05/13/17   Glean Hess, MD  Multiple Vitamins-Minerals (CENTRUM SILVER PO) Take by mouth daily.    [provider]  Omega-3 Fatty Acids (FISH OIL) 1000 MG CPDR Take 1 capsule by mouth daily.    [provider]  Probiotic Product (PROBIOTIC ADVANCED PO) Take 1 tablet by mouth daily as needed.     [provider]  ranitidine (ZANTAC) 150 MG tablet Take 150 mg by mouth 2 (two) times daily.    [provider]  simvastatin (ZOCOR) 20 MG tablet Take 1 tablet (20 mg total) by mouth at bedtime. 10/29/16   Glean Hess, MD  TURMERIC PO Take 1 tablet by mouth daily.     [provider]    Allergies  Allergen Reactions  . Ciprofloxacin Hcl Rash    Past Surgical History:  Procedure Laterality Date  . ABDOMINAL HYSTERECTOMY  1984   for bleeding  . Basil cell    . BILATERAL SALPINGOOPHORECTOMY  1984  . CATARACT EXTRACTION W/PHACO Right 11/24/2016   Procedure: CATARACT EXTRACTION PHACO AND INTRAOCULAR LENS PLACEMENT ([place]) Right;  Surgeon: Leandrew Koyanagi, MD;  Location: [place];  Service: Ophthalmology;  Laterality: Right;  . CATARACT EXTRACTION W/PHACO Left 02/04/2017   Procedure: CATARACT  EXTRACTION PHACO AND INTRAOCULAR LENS PLACEMENT ([place])  Left;  Surgeon: Leandrew Koyanagi, MD;  Location: [place];  Service: Ophthalmology;  Laterality: Left;  . ORIF WRIST FRACTURE Left 2015  . TYMPANOPLASTY  1992    Social History   Tobacco Use  . Smoking status: Never Smoker  . Smokeless tobacco: Never Used  . Tobacco comment: smoking cessation materials not required  Substance Use Topics  . Alcohol use: No    Alcohol/week: 0.0 oz  . Drug use: No     Medication list has been reviewed and updated.  PHQ 2/9 Scores 11/17/2017 11/02/2017 10/29/2016 03/07/2016  PHQ - 2 Score 0 0 0 0  PHQ- 9 Score 0 0 - -    Physical Exam  Constitutional: She is oriented to person, place, and time. She appears well-developed and well-nourished. No distress.  HENT:  Head: Normocephalic and atraumatic.  Right Ear: Tympanic membrane and ear canal normal.  Left Ear: Tympanic membrane and ear canal normal.  Nose: Right sinus exhibits no maxillary sinus tenderness. Left sinus exhibits no maxillary sinus tenderness.  Mouth/Throat: Uvula is midline and oropharynx is clear and moist.  Eyes: Conjunctivae and EOM are normal. Right eye exhibits no discharge. Left eye exhibits no discharge. No scleral icterus.  Neck: Normal range of motion. Carotid bruit is not present. No erythema present. No thyromegaly present.  Cardiovascular: Normal rate, regular rhythm, normal heart sounds and  normal pulses.  Pulmonary/Chest: Effort normal. No respiratory distress. She has no wheezes. She exhibits no mass and no tenderness. Right breast exhibits no mass, no nipple discharge, no skin change and no tenderness. Left breast exhibits no mass, no nipple discharge, no skin change and no tenderness. No breast tenderness.  Abdominal: Soft. Bowel sounds are normal. There is no hepatosplenomegaly. There is no tenderness. There is no CVA tenderness.  Musculoskeletal: Normal range of motion.  Lymphadenopathy:    She has no  cervical adenopathy.    She has no axillary adenopathy.  Neurological: She is alert and oriented to person, place, and time. She has normal reflexes. No cranial nerve deficit or sensory deficit.  Skin: Skin is warm, dry and intact. No rash noted.  Psychiatric: She has a normal mood and affect. Her speech is normal and behavior is normal. Thought content normal.  Nursing note and vitals reviewed.   BP 121/80   Pulse 78   Resp 16   Ht 5' 5"  (1.651 m)   Wt 164 lb (74.4 kg)   SpO2 98%   BMI 27.29 kg/m   Assessment and Plan: 1. Annual physical exam Normal exam UA positive but since pt is asx, will advise increased fluids  2. Benign hypertension Controlled With abnormal EKG and intermittent sx will refer - CBC with Differential/Platelet - Comprehensive metabolic panel - POCT urinalysis dipstick - EKG 12-Lead - poor R wave progression; borderline Q waves inferiorly - no prior to compare  3. CD (celiac disease) Controlled with diet - CBC with Differential/Platelet  4. Gastroesophageal reflux disease without esophagitis stable - CBC with Differential/Platelet  5. Acquired hypothyroidism supplemented - TSH  6. Dyslipidemia - Lipid panel  7. Prediabetes - Hemoglobin A1c   No orders of the defined types were placed in this encounter.   Partially dictated using Editor, commissioning. Any errors are unintentional.  Halina Maidens, MD [facility]  11/17/2017

## 2017-11-18 LAB — CBC WITH DIFFERENTIAL/PLATELET
BASOS ABS: 0 10*3/uL (ref 0.0–0.2)
Basos: 1 %
EOS (ABSOLUTE): 0.2 10*3/uL (ref 0.0–0.4)
EOS: 4 %
HEMATOCRIT: 42.4 % (ref 34.0–46.6)
Hemoglobin: 14.2 g/dL (ref 11.1–15.9)
Immature Grans (Abs): 0 10*3/uL (ref 0.0–0.1)
Immature Granulocytes: 0 %
LYMPHS ABS: 1.8 10*3/uL (ref 0.7–3.1)
Lymphs: 31 %
MCH: 30 pg (ref 26.6–33.0)
MCHC: 33.5 g/dL (ref 31.5–35.7)
MCV: 90 fL (ref 79–97)
Monocytes Absolute: 0.5 10*3/uL (ref 0.1–0.9)
Monocytes: 9 %
Neutrophils Absolute: 3.2 10*3/uL (ref 1.4–7.0)
Neutrophils: 55 %
PLATELETS: 177 10*3/uL (ref 150–379)
RBC: 4.74 x10E6/uL (ref 3.77–5.28)
RDW: 13.7 % (ref 12.3–15.4)
WBC: 5.7 10*3/uL (ref 3.4–10.8)

## 2017-11-18 LAB — COMPREHENSIVE METABOLIC PANEL
ALK PHOS: 39 IU/L (ref 39–117)
ALT: 23 IU/L (ref 0–32)
AST: 17 IU/L (ref 0–40)
Albumin/Globulin Ratio: 1.7 (ref 1.2–2.2)
Albumin: 4.6 g/dL (ref 3.5–4.7)
BUN/Creatinine Ratio: 20 (ref 12–28)
BUN: 16 mg/dL (ref 8–27)
Bilirubin Total: 0.4 mg/dL (ref 0.0–1.2)
CO2: 25 mmol/L (ref 20–29)
CREATININE: 0.8 mg/dL (ref 0.57–1.00)
Calcium: 9.7 mg/dL (ref 8.7–10.3)
Chloride: 102 mmol/L (ref 96–106)
GFR calc Af Amer: 81 mL/min/{1.73_m2} (ref >59)
GFR calc non Af Amer: 70 mL/min/{1.73_m2} (ref >59)
GLOBULIN, TOTAL: 2.7 g/dL (ref 1.5–4.5)
Glucose: 105 mg/dL — ABNORMAL HIGH (ref 65–99)
POTASSIUM: 4.4 mmol/L (ref 3.5–5.2)
SODIUM: 142 mmol/L (ref 134–144)
Total Protein: 7.3 g/dL (ref 6.0–8.5)

## 2017-11-18 LAB — HEMOGLOBIN A1C
ESTIMATED AVERAGE GLUCOSE: 134 mg/dL
HEMOGLOBIN A1C: 6.3 % — AB (ref 4.8–5.6)

## 2017-11-18 LAB — LIPID PANEL
Chol/HDL Ratio: 2.7 ratio (ref 0.0–4.4)
Cholesterol, Total: 176 mg/dL (ref 100–199)
HDL: 65 mg/dL (ref >39)
LDL Calculated: 96 mg/dL (ref 0–99)
Triglycerides: 77 mg/dL (ref 0–149)
VLDL Cholesterol Cal: 15 mg/dL (ref 5–40)

## 2017-11-18 LAB — TSH: TSH: 1.81 u[IU]/mL (ref 0.450–4.500)

## 2017-11-23 ENCOUNTER — Encounter: Payer: Self-pay | Admitting: Internal Medicine

## 2017-11-30 ENCOUNTER — Encounter: Payer: Self-pay | Admitting: Internal Medicine

## 2018-01-13 ENCOUNTER — Other Ambulatory Visit: Payer: Self-pay

## 2018-01-13 DIAGNOSIS — E1100 Type 2 diabetes mellitus with hyperosmolarity without nonketotic hyperglycemic-hyperosmolar coma (NKHHC): Secondary | ICD-10-CM

## 2018-01-29 ENCOUNTER — Other Ambulatory Visit: Payer: Self-pay | Admitting: Internal Medicine

## 2018-03-24 ENCOUNTER — Encounter: Payer: Self-pay | Admitting: Internal Medicine

## 2018-03-24 ENCOUNTER — Ambulatory Visit: Payer: Medicare Other | Admitting: Internal Medicine

## 2018-03-24 VITALS — BP 120/72 | HR 57 | Ht 65.0 in | Wt 168.0 lb

## 2018-03-24 DIAGNOSIS — R7303 Prediabetes: Secondary | ICD-10-CM

## 2018-03-24 DIAGNOSIS — E785 Hyperlipidemia, unspecified: Secondary | ICD-10-CM | POA: Diagnosis not present

## 2018-03-24 DIAGNOSIS — I10 Essential (primary) hypertension: Secondary | ICD-10-CM | POA: Diagnosis not present

## 2018-03-24 DIAGNOSIS — Z23 Encounter for immunization: Secondary | ICD-10-CM

## 2018-03-24 DIAGNOSIS — E039 Hypothyroidism, unspecified: Secondary | ICD-10-CM

## 2018-03-24 DIAGNOSIS — Z1231 Encounter for screening mammogram for malignant neoplasm of breast: Secondary | ICD-10-CM

## 2018-03-24 MED ORDER — LEVOTHYROXINE SODIUM 50 MCG PO TABS: 50.0000 ug | ORAL_TABLET | Freq: Every day | ORAL | 3 refills | Status: DC

## 2018-03-24 MED ORDER — ZOSTER VAC RECOMB ADJUVANTED 50 MCG/0.5ML IM SUSR: 0.5000 mL | Freq: Once | INTRAMUSCULAR | 1 refills | Status: AC

## 2018-03-24 MED ORDER — FLUTICASONE PROPIONATE 50 MCG/ACT NA SUSP: 2.0000 | Freq: Every day | NASAL | 3 refills | Status: DC

## 2018-03-24 MED ORDER — SIMVASTATIN 20 MG PO TABS: 20.0000 mg | ORAL_TABLET | Freq: Every day | ORAL | 3 refills | Status: DC

## 2018-03-24 NOTE — Progress Notes (Signed)
Date:  03/24/2018   Name:  Jill Dunlap   DOB:  01-Mar-1935   MRN:  [id number]   Chief Complaint: Hypertension; Diabetes; and Hypothyroidism Hypertension  This is a chronic problem. The problem is controlled. Associated symptoms include palpitations. Pertinent negatives include no chest pain, headaches or shortness of breath. Past treatments include ACE inhibitors and diuretics. The current treatment provides significant improvement. Identifiable causes of hypertension include a thyroid problem.  Thyroid Problem  Presents for follow-up visit. Symptoms include palpitations. Patient reports no fatigue or tremors. The symptoms have been stable. Her past medical history is significant for hyperlipidemia.  Diabetes  She presents for her follow-up diabetic visit. Diabetes type: prediabetes. Pertinent negatives for hypoglycemia include no headaches or tremors. Pertinent negatives for diabetes include no chest pain, no fatigue, no polydipsia and no polyuria. Her weight is stable. She participates in exercise three times a week. An ACE inhibitor/angiotensin II receptor blocker is being taken.  Hyperlipidemia  This is a chronic problem. The problem is controlled. Pertinent negatives include no chest pain or shortness of breath. Current antihyperlipidemic treatment includes statins. The current treatment provides significant improvement of lipids.  Palpitations   This is a recurrent problem. The problem occurs intermittently. The problem has been unchanged. Pertinent negatives include no chest pain, coughing, fever, numbness or shortness of breath. Treatments tried: evaluated by Cardiology - benign PVCs.   Lab Results  Component Value Date   HGBA1C 6.3 (H) 11/17/2017   Lab Results  Component Value Date   TSH 1.810 11/17/2017   Lab Results  Component Value Date   CHOL 176 11/17/2017   HDL 65 11/17/2017   LDLCALC 96 11/17/2017   TRIG 77 11/17/2017   CHOLHDL 2.7 11/17/2017   Lab Results    Component Value Date   CREATININE 0.80 11/17/2017   BUN 16 11/17/2017   NA 142 11/17/2017   K 4.4 11/17/2017   CL 102 11/17/2017   CO2 25 11/17/2017      Review of Systems  Constitutional: Negative for appetite change, fatigue, fever and unexpected weight change.  HENT: Negative for tinnitus and trouble swallowing.   Eyes: Negative for visual disturbance.  Respiratory: Negative for cough, chest tightness and shortness of breath.   Cardiovascular: Positive for palpitations. Negative for chest pain and leg swelling.  Gastrointestinal: Negative for abdominal pain.  Endocrine: Negative for polydipsia and polyuria.  Genitourinary: Negative for dysuria and hematuria.  Musculoskeletal: Negative for arthralgias.  Neurological: Negative for tremors, numbness and headaches.  Psychiatric/Behavioral: Negative for dysphoric mood.    Patient Active Problem List   Diagnosis Date Noted  . Diffuse photodamage of skin 02/13/2016  . Basal cell carcinoma 01/11/2016  . Gastroesophageal reflux disease without esophagitis 07/03/2015  . Prediabetes 03/15/2015  . Sprain of rotator cuff capsule 01/11/2015  . Acquired hypothyroidism 10/31/2014  . Allergic rhinitis 10/31/2014  . Benign hypertension 10/31/2014  . CD (celiac disease) 10/31/2014  . Dyslipidemia 10/31/2014  . OP (osteoporosis) 10/31/2014    Allergies  Allergen Reactions  . Ciprofloxacin Hcl Rash    Past Surgical History:  Procedure Laterality Date  . ABDOMINAL HYSTERECTOMY  1984   for bleeding  . Basil cell    . BILATERAL SALPINGOOPHORECTOMY  1984  . CATARACT EXTRACTION W/PHACO Right 11/24/2016   Procedure: CATARACT EXTRACTION PHACO AND INTRAOCULAR LENS PLACEMENT ([place]) Right;  Surgeon: Leandrew Koyanagi, MD;  Location: [place];  Service: Ophthalmology;  Laterality: Right;  . CATARACT EXTRACTION W/PHACO Left 02/04/2017  Procedure: CATARACT EXTRACTION PHACO AND INTRAOCULAR LENS PLACEMENT ([place])  Left;  Surgeon:  Leandrew Koyanagi, MD;  Location: [place];  Service: Ophthalmology;  Laterality: Left;  . ORIF WRIST FRACTURE Left 2015  . TYMPANOPLASTY  1992    Social History   Tobacco Use  . Smoking status: Never Smoker  . Smokeless tobacco: Never Used  . Tobacco comment: smoking cessation materials not required  Substance Use Topics  . Alcohol use: No    Alcohol/week: 0.0 standard drinks  . Drug use: No     Medication list has been reviewed and updated.  Current Meds  Medication Sig  . azelastine (ASTELIN) 0.1 % nasal spray Place 2 sprays into both nostrils 2 (two) times daily. Use in each nostril as directed  . Biotin 2500 MCG CAPS Take 1 capsule by mouth daily.  . Calcium-Vitamin D-Vitamin K [id number] MG-UNT-MCG CHEW Chew 1 tablet by mouth daily.  . Cranberry 500 MG CAPS Take 1 capsule by mouth daily.   . fexofenadine (ALLEGRA) 180 MG tablet Take 1 tablet by mouth daily.  . fluticasone (FLONASE) 50 MCG/ACT nasal spray Place 2 sprays into both nostrils daily.  . Glucosamine-Chondroitin (MOVE FREE PO) Take by mouth daily.  Marland Kitchen levothyroxine (SYNTHROID, LEVOTHROID) 50 MCG tablet TAKE 1 TABLET(50 MCG) BY MOUTH DAILY  . lisinopril-hydrochlorothiazide (PRINZIDE,ZESTORETIC) 20-12.5 MG tablet Take 1 tablet by mouth daily.  . Multiple Vitamins-Minerals (CENTRUM SILVER PO) Take by mouth daily.  . Probiotic Product (PROBIOTIC ADVANCED PO) Take 1 tablet by mouth daily as needed.   . ranitidine (ZANTAC) 150 MG tablet Take 150 mg by mouth 2 (two) times daily.  . simvastatin (ZOCOR) 20 MG tablet TAKE 1 TABLET(20 MG) BY MOUTH AT BEDTIME  . TURMERIC PO Take 1 tablet by mouth daily.     PHQ 2/9 Scores 03/24/2018 11/17/2017 11/02/2017 10/29/2016  PHQ - 2 Score 0 0 0 0  PHQ- 9 Score - 0 0 -    Physical Exam  Constitutional: She is oriented to person, place, and time. She appears well-developed. No distress.  HENT:  Head: Normocephalic and atraumatic.  Cardiovascular: Normal rate, regular  rhythm and normal heart sounds.  Pulmonary/Chest: Effort normal and breath sounds normal. No respiratory distress.  Musculoskeletal: Normal range of motion.  Neurological: She is alert and oriented to person, place, and time.  Skin: Skin is warm and dry. No rash noted.  Psychiatric: She has a normal mood and affect. Her behavior is normal. Thought content normal.  Nursing note and vitals reviewed.  Wt Readings from Last 3 Encounters:  03/24/18 168 lb (76.2 kg)  11/17/17 164 lb (74.4 kg)  11/02/17 166 lb 6.4 oz (75.5 kg)    BP 120/72 (BP Location: Right Arm, Patient Position: Sitting, Cuff Size: Normal)   Pulse (!) 57   Ht 5' 5"  (1.651 m)   Wt 168 lb (76.2 kg)   SpO2 96%   BMI 27.96 kg/m   Assessment and Plan: 1. Benign hypertension controlled - Basic metabolic panel  2. Acquired hypothyroidism supplemented - levothyroxine (SYNTHROID, LEVOTHROID) 50 MCG tablet; Take 1 tablet (50 mcg total) by mouth daily before breakfast.  Dispense: 90 tablet; Refill: 3  3. Dyslipidemia On statin therapy - simvastatin (ZOCOR) 20 MG tablet; Take 1 tablet (20 mg total) by mouth daily at 6 PM.  Dispense: 90 tablet; Refill: 3  4. Prediabetes Continue diet, weight control, exercise - Hemoglobin A1c  5. Encounter for screening mammogram for breast cancer - MM 3D SCREEN BREAST BILATERAL;  Future   Meds ordered this encounter  Medications  . simvastatin (ZOCOR) 20 MG tablet    Sig: Take 1 tablet (20 mg total) by mouth daily at 6 PM.    Dispense:  90 tablet    Refill:  3  . levothyroxine (SYNTHROID, LEVOTHROID) 50 MCG tablet    Sig: Take 1 tablet (50 mcg total) by mouth daily before breakfast.    Dispense:  90 tablet    Refill:  3  . fluticasone (FLONASE) 50 MCG/ACT nasal spray    Sig: Place 2 sprays into both nostrils daily.    Dispense:  48 g    Refill:  3    Partially dictated using Editor, commissioning. Any errors are unintentional.  Halina Maidens, MD [facility]  03/24/2018

## 2018-03-25 LAB — HEMOGLOBIN A1C
ESTIMATED AVERAGE GLUCOSE: 140 mg/dL
Hgb A1c MFr Bld: 6.5 % — ABNORMAL HIGH (ref 4.8–5.6)

## 2018-03-25 LAB — BASIC METABOLIC PANEL
BUN/Creatinine Ratio: 22 (ref 12–28)
BUN: 17 mg/dL (ref 8–27)
CO2: 23 mmol/L (ref 20–29)
CREATININE: 0.79 mg/dL (ref 0.57–1.00)
Calcium: 9.9 mg/dL (ref 8.7–10.3)
Chloride: 100 mmol/L (ref 96–106)
GFR, EST AFRICAN AMERICAN: 81 mL/min/{1.73_m2} (ref >59)
GFR, EST NON AFRICAN AMERICAN: 70 mL/min/{1.73_m2} (ref >59)
Glucose: 126 mg/dL — ABNORMAL HIGH (ref 65–99)
Potassium: 4.3 mmol/L (ref 3.5–5.2)
SODIUM: 140 mmol/L (ref 134–144)

## 2018-03-26 ENCOUNTER — Other Ambulatory Visit: Payer: Self-pay | Admitting: Internal Medicine

## 2018-03-26 DIAGNOSIS — R7303 Prediabetes: Secondary | ICD-10-CM

## 2018-03-26 NOTE — Progress Notes (Signed)
Patient informed of labs. Would like to be sent to a dietitian at the hospital [facility] to discuss how to eat better for sugar.   Please Advise.

## 2018-03-26 NOTE — Progress Notes (Unsigned)
di

## 2018-04-19 ENCOUNTER — Ambulatory Visit: Payer: Medicare Other | Admitting: Dietician

## 2018-04-20 ENCOUNTER — Encounter: Payer: Medicare Other | Attending: Internal Medicine | Admitting: Dietician

## 2018-04-20 ENCOUNTER — Encounter: Payer: Self-pay | Admitting: Dietician

## 2018-04-20 VITALS — Ht 65.0 in | Wt 165.1 lb

## 2018-04-20 DIAGNOSIS — Z713 Dietary counseling and surveillance: Secondary | ICD-10-CM | POA: Diagnosis not present

## 2018-04-20 DIAGNOSIS — R7303 Prediabetes: Secondary | ICD-10-CM | POA: Diagnosis present

## 2018-04-20 NOTE — Patient Instructions (Addendum)
Balance meals with 1-3 oz protein, 2-3 servings of carbohydrate (30-45gms) and non-starchy vegetables. Spread 8-9 servings of carbohydrate over 3 meals. Read labels for carbohydrate. Saturated fat gm recommendation: less than 10 gms/day Sodium recommendation: Ideal- less than 1556m/day less than 2000 is acceptable Try no salt Magic Seasoning. Add raw vegetables to lunch meal. Consider trying [facility] Use coupon for a free week.

## 2018-04-20 NOTE — Progress Notes (Signed)
Medical Nutrition Therapy: Visit start time: 10:30 end time: 11:30am  Assessment:  Diagnosis: pre-diabetes Past medical history: hypertension, hyperlipidemia, Celiac Psychosocial issues/ stress concerns:  None identified  Preferred learning method:  Jill Dunlap . Hands-on Current weight:165.1 lb Height: 65 in Medications, supplements: see list  Progress and evaluation:  Patient in for initial medical nutrition therapy appointment. Her most recent HgA1c was 6.5. She reports that during the summer months she did not do any structured exercise due to the heat. She states she was also frequently eating ice cream or a milk shake as an evening snack which she is no longer doing. She is eating 3 meals daily; rarely snacks. She is now walking 5-6 days per week for 30 minutes. She has lost 3 lbs in the past month. Her weight goal is 155 lbs. She was diagnosed with Celiac approximately 4-5 years ago and has eliminated gluten from her diet.  Her present diet is possibly low in protein on some days and low in calcium sources; she takes a calcium supplement.  Dietary Intake:  Usual eating pattern includes 3 meals and 0-1 snacks per day. Dining out frequency: once monthly.  Breakfast:10:00 - 5 days/week- 1 egg, 1/2 GF English muffin, grits, 1 pattie sausage, 1-2 tsp of all fruit spread, coffee (2 cups- half decaf) Lunch: 2:00pm- GF crackers, cheese or peanut butter, apple, water; sometimes just eats a vegetable left over from dinner. Supper: 6:00pm-grilled pork chop or chicken, occasionally pan fried in small amount of oil, salad, broccoli or green beans. Loves dried beans ; baked potato 1x/week.  Snack: rarely Beverages: water, coffee (black), sugar free peach tea  Nutrition Care Education: Pre-diabetes:   Instructed on identifying carbohydrate foods, portions and how to better balance carbohydrate, protein and non-starchy vegetables. (based on calorie range of 1300-1400/day) Used food guide plate  and food models as visuals for portions. Gave and reviewed sample menus. Discussed role of diet and exercise in decreasing risk of diabetes. Discussed options for exercise when weather does not allow being outside. Hypertension:   Instructed on recommendation for sodium and alternative seasonings to salt. Hyperlipidemia:   Instructed on recommendations for saturated fat acknowledging with the statin she is taking, her most recent lipid levels were in recommended ranges. Other lifestyle changes:  benefits of making changes,  identifying habits that need to change, food and drug interactions  Nutritional Diagnosis:  [place]-3.3 Overweight/obesity As related to weight gain during the summer due to lack of physical activity and frequent evening ice cream snack..  As evidenced by diet history..  Intervention:  Balance meals with 1-3 oz protein, 2-3 servings of carbohydrate (30-45gms) and non-starchy vegetables. Spread 8-9 servings of carbohydrate over 3 meals. Read labels for carbohydrate. Saturated fat gm recommendation: less than 10 gms/day Sodium recommendation: Ideal- less than 1594m/day less than 2000 is acceptable Try no salt Magic Seasoning. Add raw vegetables to lunch meal. Consider trying [facility] Use coupon for a free week. Education Materials given:  . Plate Planner . Food lists/ Planning A Balanced Meal . Quick and Healthy Meals . Sample meal pattern/ menus . Coupon for [employer]. Goals/ instructions  Learner/ who was taught:  . Patient  Level of understanding: Verbalized understanding Demonstrated degree of understanding via:   Teach back Learning barriers: . None Willingness to learn/ readiness for change: . Eager, change in progress  Monitoring and Evaluation:  Dietary intake, exercise,  and body weight Patient did not schedule a follow-up appointment. Encouraged  her to call if has further questions regarding her diet/nutrition.

## 2018-04-22 ENCOUNTER — Ambulatory Visit: Payer: Medicare Other | Admitting: Dietician

## 2018-06-07 ENCOUNTER — Other Ambulatory Visit: Payer: Self-pay | Admitting: Internal Medicine

## 2018-07-19 ENCOUNTER — Ambulatory Visit: Payer: Medicare Other

## 2018-07-20 ENCOUNTER — Ambulatory Visit
Admission: RE | Admit: 2018-07-20 | Discharge: 2018-07-20 | Disposition: A | Discharge status: Home / Self Care | Payer: Medicare Other | Source: Ambulatory Visit | Attending: Internal Medicine | Admitting: Internal Medicine

## 2018-07-20 DIAGNOSIS — Z1231 Encounter for screening mammogram for malignant neoplasm of breast: Secondary | ICD-10-CM | POA: Diagnosis present

## 2018-07-23 ENCOUNTER — Encounter: Payer: Self-pay | Admitting: Internal Medicine

## 2018-07-28 ENCOUNTER — Encounter: Payer: Self-pay | Admitting: Internal Medicine

## 2018-07-28 ENCOUNTER — Ambulatory Visit: Payer: Medicare Other | Admitting: Internal Medicine

## 2018-07-28 VITALS — BP 126/74 | HR 64 | Ht 64.25 in | Wt 157.8 lb

## 2018-07-28 DIAGNOSIS — R7303 Prediabetes: Secondary | ICD-10-CM

## 2018-07-28 DIAGNOSIS — I10 Essential (primary) hypertension: Secondary | ICD-10-CM

## 2018-07-28 DIAGNOSIS — H9193 Unspecified hearing loss, bilateral: Secondary | ICD-10-CM | POA: Diagnosis not present

## 2018-07-28 NOTE — Progress Notes (Signed)
Date:  07/28/2018   Name:  Jill Dunlap   DOB:  [birth year]   MRN:  [id number]   Chief Complaint: Diabetes (Follow up.); Hypertension (Follow up.); and difficulty hearing (Patient says she wears hearing aids but has noticed her hearing is getting worse. Unsure if she has wax built up or if she should go see hearing doctor. Wants ears checked today.)  Diabetes  She presents for her follow-up diabetic visit. She has type 2 diabetes mellitus. Her disease course has been improving. Pertinent negatives for hypoglycemia include no headaches or tremors. Pertinent negatives for diabetes include no chest pain, no fatigue, no polydipsia and no polyuria. Symptoms are stable. Her weight is decreasing steadily.  Hypertension  This is a chronic problem. The problem is controlled. Pertinent negatives include no chest pain, headaches, palpitations or shortness of breath. Past treatments include ACE inhibitors and diuretics.  Hearing loss - more loss in the left ear.  Uses aids that may need to be adjusted.  Lab Results  Component Value Date   HGBA1C 6.5 (H) 03/24/2018   Lab Results  Component Value Date   CREATININE 0.79 03/24/2018   BUN 17 03/24/2018   NA 140 03/24/2018   K 4.3 03/24/2018   CL 100 03/24/2018   CO2 23 03/24/2018    Review of Systems  Constitutional: Negative for appetite change, fatigue, fever and unexpected weight change.  HENT: Negative for tinnitus and trouble swallowing.   Eyes: Negative for visual disturbance.  Respiratory: Negative for cough, chest tightness and shortness of breath.   Cardiovascular: Negative for chest pain, palpitations and leg swelling.  Gastrointestinal: Negative for abdominal pain.  Endocrine: Negative for polydipsia and polyuria.  Genitourinary: Negative for dysuria and hematuria.  Musculoskeletal: Negative for arthralgias.  Neurological: Negative for tremors, numbness and headaches.  Psychiatric/Behavioral: Negative for dysphoric mood.     Patient Active Problem List   Diagnosis Date Noted  . Diffuse photodamage of skin 02/13/2016  . Basal cell carcinoma 01/11/2016  . Gastroesophageal reflux disease without esophagitis 07/03/2015  . Prediabetes 03/15/2015  . Sprain of rotator cuff capsule 01/11/2015  . Acquired hypothyroidism 10/31/2014  . Allergic rhinitis 10/31/2014  . Benign hypertension 10/31/2014  . CD (celiac disease) 10/31/2014  . Dyslipidemia 10/31/2014  . OP (osteoporosis) 10/31/2014    Allergies  Allergen Reactions  . Ciprofloxacin Hcl Rash    Past Surgical History:  Procedure Laterality Date  . ABDOMINAL HYSTERECTOMY  1984   for bleeding  . Basil cell    . BILATERAL SALPINGOOPHORECTOMY  1984  . CATARACT EXTRACTION W/PHACO Right 11/24/2016   Procedure: CATARACT EXTRACTION PHACO AND INTRAOCULAR LENS PLACEMENT ([place]) Right;  Surgeon: Leandrew Koyanagi, MD;  Location: [facility];  Service: Ophthalmology;  Laterality: Right;  . CATARACT EXTRACTION W/PHACO Left 02/04/2017   Procedure: CATARACT EXTRACTION PHACO AND INTRAOCULAR LENS PLACEMENT ([place])  Left;  Surgeon: Leandrew Koyanagi, MD;  Location: [facility];  Service: Ophthalmology;  Laterality: Left;  . ORIF WRIST FRACTURE Left 2015  . TYMPANOPLASTY  1992    Social History   Tobacco Use  . Smoking status: Never Smoker  . Smokeless tobacco: Never Used  . Tobacco comment: smoking cessation materials not required  Substance Use Topics  . Alcohol use: No    Alcohol/week: 0.0 standard drinks  . Drug use: No     Medication list has been reviewed and updated.  Current Meds  Medication Sig  . azelastine (ASTELIN) 0.1 % nasal spray Place 2 sprays into  both nostrils 2 (two) times daily. Use in each nostril as directed  . Biotin 2500 MCG CAPS Take 1 capsule by mouth daily.  . Cranberry 500 MG CAPS Take 1 capsule by mouth daily.   . fexofenadine (ALLEGRA) 180 MG tablet Take 1 tablet by mouth daily.  . fluticasone (FLONASE) 50  MCG/ACT nasal spray Place 2 sprays into both nostrils daily.  . Glucosamine-Chondroitin (MOVE FREE PO) Take by mouth daily.  Marland Kitchen levothyroxine (SYNTHROID, LEVOTHROID) 50 MCG tablet Take 1 tablet (50 mcg total) by mouth daily before breakfast.  . levothyroxine (SYNTHROID, LEVOTHROID) 50 MCG tablet TAKE 1 TABLET(50 MCG) BY MOUTH DAILY  . lisinopril-hydrochlorothiazide (PRINZIDE,ZESTORETIC) 20-12.5 MG tablet Take 1 tablet by mouth daily.  . Multiple Vitamins-Minerals (CENTRUM SILVER PO) Take by mouth daily.  . Probiotic Product (PROBIOTIC ADVANCED PO) Take 1 tablet by mouth daily as needed.   . simvastatin (ZOCOR) 20 MG tablet Take 1 tablet (20 mg total) by mouth daily at 6 PM.  . TURMERIC PO Take 1 tablet by mouth daily.     PHQ 2/9 Scores 04/20/2018 03/24/2018 11/17/2017 11/02/2017  PHQ - 2 Score 0 0 0 0  PHQ- 9 Score - - 0 0    Physical Exam Vitals signs and nursing note reviewed.  Constitutional:      General: She is not in acute distress.    Appearance: She is well-developed.  HENT:     Head: Normocephalic and atraumatic.     Right Ear: Tympanic membrane and ear canal normal.     Left Ear: Ear canal normal. Tympanic membrane is scarred.  Eyes:     Pupils: Pupils are equal, round, and reactive to light.  Neck:     Musculoskeletal: Normal range of motion and neck supple.  Cardiovascular:     Rate and Rhythm: Normal rate and regular rhythm.     Pulses: Normal pulses.  Pulmonary:     Effort: Pulmonary effort is normal. No respiratory distress.     Breath sounds: Normal breath sounds.  Musculoskeletal: Normal range of motion.     Right lower leg: No edema.     Left lower leg: No edema.  Lymphadenopathy:     Cervical: No cervical adenopathy.  Skin:    General: Skin is warm and dry.     Findings: No rash.  Neurological:     Mental Status: She is alert and oriented to person, place, and time.  Psychiatric:        Behavior: Behavior normal.        Thought Content: Thought content  normal.    Wt Readings from Last 3 Encounters:  07/28/18 157 lb 12.8 oz (71.6 kg)  04/20/18 165 lb 1.6 oz (74.9 kg)  03/24/18 168 lb (76.2 kg)    BP 126/74 (BP Location: Right Arm, Patient Position: Sitting, Cuff Size: Normal)   Pulse 64   Ht 5' 4.25" (1.632 m)   Wt 157 lb 12.8 oz (71.6 kg)   SpO2 98%   BMI 26.88 kg/m   Assessment and Plan: 1. Benign hypertension controlled  2. Prediabetes Doing well with excellent weight loss - Hemoglobin Y5R - Basic metabolic panel  3. Bilateral hearing loss, unspecified hearing loss type Follow up for hearing aid adjustment   Partially dictated using Editor, commissioning. Any errors are unintentional.  Halina Maidens, MD [facility]  07/28/2018

## 2018-07-29 LAB — BASIC METABOLIC PANEL
BUN / CREAT RATIO: 22 (ref 12–28)
BUN: 17 mg/dL (ref 8–27)
CO2: 24 mmol/L (ref 20–29)
Calcium: 9.9 mg/dL (ref 8.7–10.3)
Chloride: 101 mmol/L (ref 96–106)
Creatinine, Ser: 0.78 mg/dL (ref 0.57–1.00)
GFR calc Af Amer: 82 mL/min/{1.73_m2} (ref >59)
GFR calc non Af Amer: 72 mL/min/{1.73_m2} (ref >59)
Glucose: 112 mg/dL — ABNORMAL HIGH (ref 65–99)
Potassium: 4 mmol/L (ref 3.5–5.2)
Sodium: 141 mmol/L (ref 134–144)

## 2018-07-29 LAB — HEMOGLOBIN A1C
Est. average glucose Bld gHb Est-mCnc: 137 mg/dL
HEMOGLOBIN A1C: 6.4 % — AB (ref 4.8–5.6)

## 2018-10-25 ENCOUNTER — Other Ambulatory Visit: Payer: Self-pay | Admitting: Internal Medicine

## 2018-10-27 ENCOUNTER — Ambulatory Visit: Payer: Medicare Other

## 2018-10-27 ENCOUNTER — Telehealth: Payer: Self-pay | Admitting: Internal Medicine

## 2018-10-27 NOTE — Telephone Encounter (Signed)
Called to RE-schedule Medicare Annual Wellness Visit with Nurse Health Advisor.   If patient returns call, please schedule AWV with NHA ~~AFTER June 1ST~~   For any questions please contact:  Jill Alexanders [phone] or skype at: [email or web address]

## 2018-10-29 NOTE — Telephone Encounter (Signed)
2nd attempt

## 2018-11-19 ENCOUNTER — Ambulatory Visit (INDEPENDENT_AMBULATORY_CARE_PROVIDER_SITE_OTHER): Payer: Medicare Other | Admitting: Internal Medicine

## 2018-11-19 ENCOUNTER — Encounter: Payer: Self-pay | Admitting: Internal Medicine

## 2018-11-19 ENCOUNTER — Other Ambulatory Visit: Payer: Self-pay

## 2018-11-19 VITALS — BP 124/60 | HR 66 | Ht 65.0 in | Wt 154.0 lb

## 2018-11-19 DIAGNOSIS — R7303 Prediabetes: Secondary | ICD-10-CM

## 2018-11-19 DIAGNOSIS — Z1231 Encounter for screening mammogram for malignant neoplasm of breast: Secondary | ICD-10-CM

## 2018-11-19 DIAGNOSIS — K219 Gastro-esophageal reflux disease without esophagitis: Secondary | ICD-10-CM | POA: Diagnosis not present

## 2018-11-19 DIAGNOSIS — K900 Celiac disease: Secondary | ICD-10-CM

## 2018-11-19 DIAGNOSIS — I10 Essential (primary) hypertension: Secondary | ICD-10-CM | POA: Diagnosis not present

## 2018-11-19 DIAGNOSIS — E039 Hypothyroidism, unspecified: Secondary | ICD-10-CM

## 2018-11-19 DIAGNOSIS — Z0000 Encounter for general adult medical examination without abnormal findings: Secondary | ICD-10-CM | POA: Diagnosis not present

## 2018-11-19 DIAGNOSIS — E785 Hyperlipidemia, unspecified: Secondary | ICD-10-CM

## 2018-11-19 LAB — POCT URINALYSIS DIPSTICK
Bilirubin, UA: NEGATIVE
Glucose, UA: NEGATIVE
Ketones, UA: NEGATIVE
Nitrite, UA: POSITIVE
Protein, UA: NEGATIVE
Spec Grav, UA: 1.01 (ref 1.010–1.025)
Urobilinogen, UA: 0.2 E.U./dL
pH, UA: 6 (ref 5.0–8.0)

## 2018-11-19 NOTE — Progress Notes (Signed)
Date:  11/19/2018   Name:  Jill Dunlap   DOB:  17-Dec-1934   MRN:  [id number]   Chief Complaint: Annual Exam (Breast Exam. No pap. ) Jill Dunlap is a 84 y.o. female who presents today for her Complete Annual Exam. She feels well. She reports exercising regularly. She reports she is sleeping well. She denies breast issues. Mammogram 06/2018 Immunization are up to date  Hypertension  This is a chronic problem. The problem is controlled. Associated symptoms include palpitations (occasional, asx). Pertinent negatives include no chest pain, headaches or shortness of breath. Past treatments include ACE inhibitors and diuretics. The current treatment provides significant improvement. Identifiable causes of hypertension include a thyroid problem.  Hyperlipidemia  The problem is controlled. Pertinent negatives include no chest pain or shortness of breath. Current antihyperlipidemic treatment includes statins. The current treatment provides significant improvement of lipids.  Thyroid Problem  Presents for follow-up visit. Symptoms include palpitations (occasional, asx). Patient reports no anxiety, constipation, diarrhea, fatigue or tremors. The symptoms have been stable. Her past medical history is significant for hyperlipidemia.  Celiac disease - she is doing well with gluten free diet.  She has intermittent frequent loose stools without blood.  No weight loss, weakness or other complaints. Palpitations - intermittent, seen by Cardiology last year and worked up with ECHO stress test and holter monitor.   INTERPRETATION Normal Stress Echocardiogram NORMAL RIGHT VENTRICULAR SYSTOLIC FUNCTION MILD VALVULAR REGURGITATION (See above) NO VALVULAR STENOSIS NOTED  Lab Results  Component Value Date   HGBA1C 6.4 (H) 07/28/2018   Lab Results  Component Value Date   CREATININE 0.78 07/28/2018   BUN 17 07/28/2018   NA 141 07/28/2018   K 4.0 07/28/2018   CL 101 07/28/2018   CO2 24  07/28/2018   Lab Results  Component Value Date   CHOL 176 11/17/2017   HDL 65 11/17/2017   LDLCALC 96 11/17/2017   TRIG 77 11/17/2017   CHOLHDL 2.7 11/17/2017    Review of Systems  Constitutional: Negative for chills, fatigue and fever.  HENT: Positive for postnasal drip. Negative for congestion, hearing loss, tinnitus, trouble swallowing and voice change.   Eyes: Negative for visual disturbance.  Respiratory: Negative for cough, chest tightness, shortness of breath and wheezing.   Cardiovascular: Positive for palpitations (occasional, asx). Negative for chest pain and leg swelling.  Gastrointestinal: Negative for abdominal pain, constipation, diarrhea and vomiting.  Endocrine: Negative for polydipsia and polyuria.  Genitourinary: Negative for dysuria, frequency, genital sores, vaginal bleeding and vaginal discharge.  Musculoskeletal: Negative for arthralgias, gait problem and joint swelling.  Skin: Negative for color change and rash.  Allergic/Immunologic: Positive for environmental allergies.  Neurological: Negative for dizziness, tremors, light-headedness and headaches.  Hematological: Negative for adenopathy. Does not bruise/bleed easily.  Psychiatric/Behavioral: Negative for dysphoric mood and sleep disturbance. The patient is not nervous/anxious.     Patient Active Problem List   Diagnosis Date Noted  . Diffuse photodamage of skin 02/13/2016  . Basal cell carcinoma 01/11/2016  . Gastroesophageal reflux disease without esophagitis 07/03/2015  . Prediabetes 03/15/2015  . Sprain of rotator cuff capsule 01/11/2015  . Acquired hypothyroidism 10/31/2014  . Allergic rhinitis 10/31/2014  . Benign hypertension 10/31/2014  . CD (celiac disease) 10/31/2014  . Dyslipidemia 10/31/2014  . OP (osteoporosis) 10/31/2014    Allergies  Allergen Reactions  . Ciprofloxacin Hcl Rash    Past Surgical History:  Procedure Laterality Date  . ABDOMINAL HYSTERECTOMY  1984   for bleeding   .  Basil cell    . BILATERAL SALPINGOOPHORECTOMY  1984  . CATARACT EXTRACTION W/PHACO Right 11/24/2016   Procedure: CATARACT EXTRACTION PHACO AND INTRAOCULAR LENS PLACEMENT ([place]) Right;  Surgeon: Leandrew Koyanagi, MD;  Location: [place];  Service: Ophthalmology;  Laterality: Right;  . CATARACT EXTRACTION W/PHACO Left 02/04/2017   Procedure: CATARACT EXTRACTION PHACO AND INTRAOCULAR LENS PLACEMENT ([place])  Left;  Surgeon: Leandrew Koyanagi, MD;  Location: [place];  Service: Ophthalmology;  Laterality: Left;  . ORIF WRIST FRACTURE Left 2015  . TYMPANOPLASTY  1992    Social History   Tobacco Use  . Smoking status: Never Smoker  . Smokeless tobacco: Never Used  . Tobacco comment: smoking cessation materials not required  Substance Use Topics  . Alcohol use: No    Alcohol/week: 0.0 standard drinks  . Drug use: No     Medication list has been reviewed and updated.  Current Meds  Medication Sig  . azelastine (ASTELIN) 0.1 % nasal spray INSTILL 2 SPRAYS IN EACH NOSTRIL TWICE DAILY AS DIRECTED  . Biotin 2500 MCG CAPS Take 1 capsule by mouth daily.  . Cranberry 500 MG CAPS Take 1 capsule by mouth daily.   . fexofenadine (ALLEGRA) 180 MG tablet Take 1 tablet by mouth daily.  . fluticasone (FLONASE) 50 MCG/ACT nasal spray Place 2 sprays into both nostrils daily.  . Glucosamine-Chondroitin (MOVE FREE PO) Take by mouth daily.  Marland Kitchen levothyroxine (SYNTHROID, LEVOTHROID) 50 MCG tablet TAKE 1 TABLET(50 MCG) BY MOUTH DAILY  . lisinopril-hydrochlorothiazide (PRINZIDE,ZESTORETIC) 20-12.5 MG tablet Take 1 tablet by mouth daily.  . Multiple Vitamins-Minerals (CENTRUM SILVER PO) Take by mouth daily.  . Probiotic Product (PROBIOTIC ADVANCED PO) Take 1 tablet by mouth daily as needed.   . simvastatin (ZOCOR) 20 MG tablet Take 1 tablet (20 mg total) by mouth daily at 6 PM.  . TURMERIC PO Take 1 tablet by mouth daily.     PHQ 2/9 Scores 11/19/2018 04/20/2018 03/24/2018 11/17/2017  PHQ  - 2 Score 0 0 0 0  PHQ- 9 Score - - - 0    BP Readings from Last 3 Encounters:  11/19/18 124/60  07/28/18 126/74  03/24/18 120/72    Physical Exam Vitals signs and nursing note reviewed.  Constitutional:      General: She is not in acute distress.    Appearance: She is well-developed.  HENT:     Head: Normocephalic and atraumatic.     Right Ear: Tympanic membrane and ear canal normal.     Left Ear: Tympanic membrane and ear canal normal.     Nose:     Right Sinus: No maxillary sinus tenderness.     Left Sinus: No maxillary sinus tenderness.     Mouth/Throat:     Pharynx: Uvula midline.  Eyes:     General: No scleral icterus.       Right eye: No discharge.        Left eye: No discharge.     Conjunctiva/sclera: Conjunctivae normal.  Neck:     Musculoskeletal: Normal range of motion. No erythema.     Thyroid: No thyromegaly.     Vascular: No carotid bruit.  Cardiovascular:     Rate and Rhythm: Normal rate and regular rhythm.     Pulses: Normal pulses.     Heart sounds: Normal heart sounds.  Pulmonary:     Effort: Pulmonary effort is normal. No respiratory distress.     Breath sounds: No wheezing.  Chest:  Breasts:        Right: Inverted nipple present. No mass, nipple discharge, skin change or tenderness.        Left: Inverted nipple present. No mass, nipple discharge, skin change or tenderness.  Abdominal:     General: Bowel sounds are normal.     Palpations: Abdomen is soft.     Tenderness: There is no abdominal tenderness.  Musculoskeletal: Normal range of motion.  Lymphadenopathy:     Cervical: No cervical adenopathy.  Skin:    General: Skin is warm and dry.     Findings: No rash.  Neurological:     Mental Status: She is alert and oriented to person, place, and time.     Cranial Nerves: No cranial nerve deficit.     Sensory: No sensory deficit.     Deep Tendon Reflexes: Reflexes are normal and symmetric.  Psychiatric:        Attention and Perception:  Attention normal.        Mood and Affect: Mood normal.        Speech: Speech normal.        Behavior: Behavior normal.        Thought Content: Thought content normal.     Wt Readings from Last 3 Encounters:  11/19/18 154 lb (69.9 kg)  07/28/18 157 lb 12.8 oz (71.6 kg)  04/20/18 165 lb 1.6 oz (74.9 kg)    BP 124/60   Pulse 66   Ht 5' 5"  (1.651 m)   Wt 154 lb (69.9 kg)   SpO2 98%   BMI 25.63 kg/m   Assessment and Plan: 1. Annual physical exam Normal exam - POCT urinalysis dipstick  Urine dipstick shows positive for nitrates and positive for leukocytes.  Micro exam: 5 WBC's per HPF and 4+ bacteria. Pt has hx of asx bacteruria - will not treat  2. Encounter for screening mammogram for breast cancer Due in December  3. Benign hypertension controlled - CBC with Differential/Platelet - Comprehensive metabolic panel  4. CD (celiac disease) Continue gluten free diet - CBC with Differential/Platelet  5. Gastroesophageal reflux disease without esophagitis stable  6. Acquired hypothyroidism supplemented - TSH  7. Dyslipidemia On statin therapy - Lipid panel  8. Prediabetes Continue healthy diet and exercise - Hemoglobin A1c   Partially dictated using Editor, commissioning. Any errors are unintentional.  Halina Maidens, MD [facility]  11/19/2018

## 2018-11-20 LAB — COMPREHENSIVE METABOLIC PANEL
ALT: 19 IU/L (ref 0–32)
AST: 23 IU/L (ref 0–40)
Albumin/Globulin Ratio: 1.7 (ref 1.2–2.2)
Albumin: 4.7 g/dL — ABNORMAL HIGH (ref 3.6–4.6)
Alkaline Phosphatase: 31 IU/L — ABNORMAL LOW (ref 39–117)
BUN/Creatinine Ratio: 21 (ref 12–28)
BUN: 19 mg/dL (ref 8–27)
Bilirubin Total: 0.5 mg/dL (ref 0.0–1.2)
CO2: 25 mmol/L (ref 20–29)
Calcium: 10.4 mg/dL — ABNORMAL HIGH (ref 8.7–10.3)
Chloride: 98 mmol/L (ref 96–106)
Creatinine, Ser: 0.92 mg/dL (ref 0.57–1.00)
GFR calc Af Amer: 68 mL/min/{1.73_m2} (ref >59)
GFR calc non Af Amer: 59 mL/min/{1.73_m2} — ABNORMAL LOW (ref >59)
Globulin, Total: 2.7 g/dL (ref 1.5–4.5)
Glucose: 125 mg/dL — ABNORMAL HIGH (ref 65–99)
Potassium: 4 mmol/L (ref 3.5–5.2)
Sodium: 138 mmol/L (ref 134–144)
Total Protein: 7.4 g/dL (ref 6.0–8.5)

## 2018-11-20 LAB — CBC WITH DIFFERENTIAL/PLATELET
Basophils Absolute: 0 10*3/uL (ref 0.0–0.2)
Basos: 1 %
EOS (ABSOLUTE): 0.1 10*3/uL (ref 0.0–0.4)
Eos: 1 %
Hematocrit: 42.3 % (ref 34.0–46.6)
Hemoglobin: 13.9 g/dL (ref 11.1–15.9)
Immature Grans (Abs): 0 10*3/uL (ref 0.0–0.1)
Immature Granulocytes: 0 %
Lymphocytes Absolute: 1.2 10*3/uL (ref 0.7–3.1)
Lymphs: 16 %
MCH: 29.9 pg (ref 26.6–33.0)
MCHC: 32.9 g/dL (ref 31.5–35.7)
MCV: 91 fL (ref 79–97)
Monocytes Absolute: 0.6 10*3/uL (ref 0.1–0.9)
Monocytes: 8 %
Neutrophils Absolute: 5.4 10*3/uL (ref 1.4–7.0)
Neutrophils: 74 %
Platelets: 170 10*3/uL (ref 150–450)
RBC: 4.65 x10E6/uL (ref 3.77–5.28)
RDW: 13 % (ref 11.7–15.4)
WBC: 7.3 10*3/uL (ref 3.4–10.8)

## 2018-11-20 LAB — LIPID PANEL
Chol/HDL Ratio: 2.6 ratio (ref 0.0–4.4)
Cholesterol, Total: 169 mg/dL (ref 100–199)
HDL: 64 mg/dL (ref >39)
LDL Calculated: 84 mg/dL (ref 0–99)
Triglycerides: 103 mg/dL (ref 0–149)
VLDL Cholesterol Cal: 21 mg/dL (ref 5–40)

## 2018-11-20 LAB — HEMOGLOBIN A1C
Est. average glucose Bld gHb Est-mCnc: 128 mg/dL
Hgb A1c MFr Bld: 6.1 % — ABNORMAL HIGH (ref 4.8–5.6)

## 2018-11-20 LAB — TSH: TSH: 1.66 u[IU]/mL (ref 0.450–4.500)

## 2019-01-26 ENCOUNTER — Ambulatory Visit (INDEPENDENT_AMBULATORY_CARE_PROVIDER_SITE_OTHER): Payer: Medicare Other

## 2019-01-26 ENCOUNTER — Other Ambulatory Visit: Payer: Self-pay

## 2019-01-26 VITALS — BP 114/66 | HR 72 | Temp 98.2°F | Resp 16 | Ht 65.0 in | Wt 153.0 lb

## 2019-01-26 DIAGNOSIS — Z0000 Encounter for general adult medical examination without abnormal findings: Secondary | ICD-10-CM

## 2019-01-26 NOTE — Patient Instructions (Signed)
Ms. Jill Dunlap , Thank you for taking time to come for your Medicare Wellness Visit. I appreciate your ongoing commitment to your health goals. Please review the following plan we discussed and let me know if I can assist you in the future.   Screening recommendations/referrals: Colonoscopy: done 04/17/10. No longer required.  Mammogram: done 07/20/18 Bone Density: done 11/12/16 Recommended yearly ophthalmology/optometry visit for glaucoma screening and checkup Recommended yearly dental visit for hygiene and checkup  Vaccinations: Influenza vaccine: done 04/30/18 Pneumococcal vaccine: done 06/29/14 Tdap vaccine: done 10/30/08 Shingles vaccine: done 06/09/18    Advanced directives: Please bring a copy of your health care power of attorney and living will to the office at your convenience.  Conditions/risks identified: Keep up the great work!  Next appointment: Please follow up in one year for your Medicare Annual Wellness visit.     Preventive Care 26 Years and Older, Female Preventive care refers to lifestyle choices and visits with your health care provider that can promote health and wellness. What does preventive care include?  A yearly physical exam. This is also called an annual well check.  Dental exams once or twice a year.  Routine eye exams. Ask your health care provider how often you should have your eyes checked.  Personal lifestyle choices, including:  Daily care of your teeth and gums.  Regular physical activity.  Eating a healthy diet.  Avoiding tobacco and drug use.  Limiting alcohol use.  Practicing safe sex.  Taking low-dose aspirin every day.  Taking vitamin and mineral supplements as recommended by your health care provider. What happens during an annual well check? The services and screenings done by your health care provider during your annual well check will depend on your age, overall health, lifestyle risk factors, and family history of disease.  Counseling  Your health care provider may ask you questions about your:  Alcohol use.  Tobacco use.  Drug use.  Emotional well-being.  Home and relationship well-being.  Sexual activity.  Eating habits.  History of falls.  Memory and ability to understand (cognition).  Work and work Statistician.  Reproductive health. Screening  You may have the following tests or measurements:  Height, weight, and BMI.  Blood pressure.  Lipid and cholesterol levels. These may be checked every 5 years, or more frequently if you are over 64 years old.  Skin check.  Lung cancer screening. You may have this screening every year starting at age 28 if you have a 30-pack-year history of smoking and currently smoke or have quit within the past 15 years.  Fecal occult blood test (FOBT) of the stool. You may have this test every year starting at age 22.  Flexible sigmoidoscopy or colonoscopy. You may have a sigmoidoscopy every 5 years or a colonoscopy every 10 years starting at age 38.  Hepatitis C blood test.  Hepatitis B blood test.  Sexually transmitted disease (STD) testing.  Diabetes screening. This is done by checking your blood sugar (glucose) after you have not eaten for a while (fasting). You may have this done every 1-3 years.  Bone density scan. This is done to screen for osteoporosis. You may have this done starting at age 64.  Mammogram. This may be done every 1-2 years. Talk to your health care provider about how often you should have regular mammograms. Talk with your health care provider about your test results, treatment options, and if necessary, the need for more tests. Vaccines  Your health care provider may recommend  certain vaccines, such as:  Influenza vaccine. This is recommended every year.  Tetanus, diphtheria, and acellular pertussis (Tdap, Td) vaccine. You may need a Td booster every 10 years.  Zoster vaccine. You may need this after age 1.   Pneumococcal 13-valent conjugate (PCV13) vaccine. One dose is recommended after age 33.  Pneumococcal polysaccharide (PPSV23) vaccine. One dose is recommended after age 47. Talk to your health care provider about which screenings and vaccines you need and how often you need them. This information is not intended to replace advice given to you by your health care provider. Make sure you discuss any questions you have with your health care provider. Document Released: 08/03/2015 Document Revised: 03/26/2016 Document Reviewed: 05/08/2015 Elsevier Interactive Patient Education  2017 [place] Prevention in the Home Falls can cause injuries. They can happen to people of all ages. There are many things you can do to make your home safe and to help prevent falls. What can I do on the outside of my home?  Regularly fix the edges of walkways and driveways and fix any cracks.  Remove anything that might make you trip as you walk through a door, such as a raised step or threshold.  Trim any bushes or trees on the path to your home.  Use bright outdoor lighting.  Clear any walking paths of anything that might make someone trip, such as rocks or tools.  Regularly check to see if handrails are loose or broken. Make sure that both sides of any steps have handrails.  Any raised decks and porches should have guardrails on the edges.  Have any leaves, snow, or ice cleared regularly.  Use sand or salt on walking paths during winter.  Clean up any spills in your garage right away. This includes oil or grease spills. What can I do in the bathroom?  Use night lights.  Install grab bars by the toilet and in the tub and shower. Do not use towel bars as grab bars.  Use non-skid mats or decals in the tub or shower.  If you need to sit down in the shower, use a plastic, non-slip stool.  Keep the floor dry. Clean up any water that spills on the floor as soon as it happens.  Remove soap  buildup in the tub or shower regularly.  Attach bath mats securely with double-sided non-slip rug tape.  Do not have throw rugs and other things on the floor that can make you trip. What can I do in the bedroom?  Use night lights.  Make sure that you have a light by your bed that is easy to reach.  Do not use any sheets or blankets that are too big for your bed. They should not hang down onto the floor.  Have a firm chair that has side arms. You can use this for support while you get dressed.  Do not have throw rugs and other things on the floor that can make you trip. What can I do in the kitchen?  Clean up any spills right away.  Avoid walking on wet floors.  Keep items that you use a lot in easy-to-reach places.  If you need to reach something above you, use a strong step stool that has a grab bar.  Keep electrical cords out of the way.  Do not use floor polish or wax that makes floors slippery. If you must use wax, use non-skid floor wax.  Do not have throw rugs and other  things on the floor that can make you trip. What can I do with my stairs?  Do not leave any items on the stairs.  Make sure that there are handrails on both sides of the stairs and use them. Fix handrails that are broken or loose. Make sure that handrails are as long as the stairways.  Check any carpeting to make sure that it is firmly attached to the stairs. Fix any carpet that is loose or worn.  Avoid having throw rugs at the top or bottom of the stairs. If you do have throw rugs, attach them to the floor with carpet tape.  Make sure that you have a light switch at the top of the stairs and the bottom of the stairs. If you do not have them, ask someone to add them for you. What else can I do to help prevent falls?  Wear shoes that:  Do not have high heels.  Have rubber bottoms.  Are comfortable and fit you well.  Are closed at the toe. Do not wear sandals.  If you use a stepladder:  Make  sure that it is fully opened. Do not climb a closed stepladder.  Make sure that both sides of the stepladder are locked into place.  Ask someone to hold it for you, if possible.  Clearly mark and make sure that you can see:  Any grab bars or handrails.  First and last steps.  Where the edge of each step is.  Use tools that help you move around (mobility aids) if they are needed. These include:  Canes.  Walkers.  Scooters.  Crutches.  Turn on the lights when you go into a dark area. Replace any light bulbs as soon as they burn out.  Set up your furniture so you have a clear path. Avoid moving your furniture around.  If any of your floors are uneven, fix them.  If there are any pets around you, be aware of where they are.  Review your medicines with your doctor. Some medicines can make you feel dizzy. This can increase your chance of falling. Ask your doctor what other things that you can do to help prevent falls. This information is not intended to replace advice given to you by your health care provider. Make sure you discuss any questions you have with your health care provider. Document Released: 05/03/2009 Document Revised: 12/13/2015 Document Reviewed: 08/11/2014 Elsevier Interactive Patient Education  2017 [employer].

## 2019-01-26 NOTE — Progress Notes (Signed)
Subjective:   Jill Dunlap is a 84 y.o. female who presents for Medicare Annual (Subsequent) preventive examination.  Review of Systems:   Cardiac Risk Factors include: advanced age (>1mn, >>53women);dyslipidemia;hypertension     Objective:     Vitals: BP 114/66 (BP Location: Left Arm, Patient Position: Sitting, Cuff Size: Normal)   Pulse 72   Temp 98.2 F (36.8 C) (Oral)   Resp 16   Ht 5' 5"  (1.651 m)   Wt 153 lb (69.4 kg)   SpO2 98%   BMI 25.46 kg/m   Body mass index is 25.46 kg/m.  Advanced Directives 01/26/2019 04/20/2018 11/02/2017 02/04/2017 11/24/2016 10/29/2016  Does Patient Have a Medical Advance Directive? Yes Yes Yes Yes Yes Yes  Type of AParamedicof ACorsicaLiving will HSt. AugustaLiving will HTwo RiversLiving will HMahaffeyLiving will HMount OlivetLiving will Living will  Does patient want to make changes to medical advance directive? - - Yes (MAU/Ambulatory/Procedural Areas - Information given) - No - Patient declined -  Copy of [place]in Chart? No - copy requested - No - copy requested - No - copy requested -    Tobacco Social History   Tobacco Use  Smoking Status Never Smoker  Smokeless Tobacco Never Used  Tobacco Comment   smoking cessation materials not required     Counseling given: Not Answered Comment: smoking cessation materials not required   Clinical Intake:  Pre-visit preparation completed: Yes  Pain : No/denies pain     BMI - recorded: 25.46 Nutritional Status: BMI 25 -29 Overweight Nutritional Risks: None Diabetes: No  How often do you need to have someone help you when you read instructions, pamphlets, or other written materials from your doctor or pharmacy?: 1 - Never  Interpreter Needed?: No  Information entered by :: KClemetine MarkerLPN  Past Medical History:  Diagnosis Date  . Allergy   . Cancer ([place]     skin ca on leg head and back  . Celiac disease   . Hyperlipidemia   . Hypertension   . Hypothyroidism   . Pre-diabetes   . Vertigo    no episodes in several years  . Wears dentures    partial lower   Past Surgical History:  Procedure Laterality Date  . ABDOMINAL HYSTERECTOMY  1984   for bleeding  . Basil cell    . BILATERAL SALPINGOOPHORECTOMY  1984  . CATARACT EXTRACTION W/PHACO Right 11/24/2016   Procedure: CATARACT EXTRACTION PHACO AND INTRAOCULAR LENS PLACEMENT ([place];  Surgeon: BLeandrew Koyanagi MD;  [facility]  Service: Ophthalmology;  Laterality: Right;  . CATARACT EXTRACTION W/PHACO Left 02/04/2017   Procedure: CATARACT EXTRACTION PHACO AND INTRAOCULAR LENS PLACEMENT ([place]  Left;  Surgeon: BLeandrew Koyanagi MD;  [place] [facility]  Service: Ophthalmology;  Laterality: Left;  . ORIF WRIST FRACTURE Left 2015  . TYMPANOPLASTY  1992   Family History  Problem Relation Age of Onset  . Cancer Mother        oral  . Hypertension Father   . Heart disease Father   . Breast cancer Neg Hx    Social History   Socioeconomic History  . Marital status: Married    Spouse name: Jill Dunlap . Number of children: 2  . Years of education: Not on file  . Highest education level: Bachelor's degree (e.g., BA, AB, BS)  Occupational History  . Occupation: Retired  SScientific laboratory technician .  Financial resource strain: Not hard at all  . Food insecurity    Worry: Never true    Inability: Never true  . Transportation needs    Medical: No    Non-medical: No  Tobacco Use  . Smoking status: Never Smoker  . Smokeless tobacco: Never Used  . Tobacco comment: smoking cessation materials not required  Substance and Sexual Activity  . Alcohol use: No    Alcohol/week: 0.0 standard drinks  . Drug use: No  . Sexual activity: Not Currently  Lifestyle  . Physical activity    Days per week: 7 days    Minutes per session: 50 min  . Stress: Not at all   Relationships  . Social connections    Talks on phone: More than three times a week    Gets together: Three times a week    Attends religious service: More than 4 times per year    Active member of club or organization: No    Attends meetings of clubs or organizations: Never    Relationship status: Married  Other Topics Concern  . Not on file  Social History Narrative  . Not on file    Outpatient Encounter Medications as of 01/26/2019  Medication Sig  . azelastine (ASTELIN) 0.1 % nasal spray INSTILL 2 SPRAYS IN EACH NOSTRIL TWICE DAILY AS DIRECTED  . Biotin 2500 MCG CAPS Take 1 capsule by mouth daily.  . Cranberry 500 MG CAPS Take 1 capsule by mouth daily.   . fexofenadine (ALLEGRA) 180 MG tablet Take 1 tablet by mouth daily.  . fluticasone (FLONASE) 50 MCG/ACT nasal spray Place 2 sprays into both nostrils daily.  . Glucosamine-Chondroitin (MOVE FREE PO) Take by mouth daily.  Marland Kitchen levothyroxine (SYNTHROID, LEVOTHROID) 50 MCG tablet TAKE 1 TABLET(50 MCG) BY MOUTH DAILY  . lisinopril-hydrochlorothiazide (PRINZIDE,ZESTORETIC) 20-12.5 MG tablet Take 1 tablet by mouth daily.  . Multiple Vitamins-Minerals (CENTRUM SILVER PO) Take by mouth daily.  . Probiotic Product (PROBIOTIC ADVANCED PO) Take 1 tablet by mouth daily as needed.   . simvastatin (ZOCOR) 20 MG tablet Take 1 tablet (20 mg total) by mouth daily at 6 PM.  . TURMERIC PO Take 1 tablet by mouth daily.    No facility-administered encounter medications on file as of 01/26/2019.     Activities of Daily Living In your present state of health, do you have any difficulty performing the following activities: 01/26/2019  Hearing? Y  Comment wears hearing aids  Vision? N  Difficulty concentrating or making decisions? N  Walking or climbing stairs? N  Dressing or bathing? N  Doing errands, shopping? N  Preparing Food and eating ? N  Using the Toilet? N  In the past six months, have you accidently leaked urine? N  Do you have problems  with loss of bowel control? N  Managing your Medications? N  Managing your Finances? N  Housekeeping or managing your Housekeeping? N  Some recent data might be hidden    Patient Care Team: Glean Hess, MD as PCP - General (Family Medicine) Lucilla Lame, MD as Consulting Physician (Gastroenterology)    Assessment:   This is a routine wellness examination for Jill Dunlap.  Exercise Activities and Dietary recommendations Current Exercise Habits: Home exercise routine, Type of exercise: walking, Time (Minutes): 50, Frequency (Times/Week): 7, Weekly Exercise (Minutes/Week): 350, Intensity: Mild, Exercise limited by: None identified  Goals    . DIET - INCREASE WATER INTAKE     Recommend to drink at least 6-8 8oz  glasses of water per day.       Fall Risk Fall Risk  01/26/2019 11/19/2018 04/20/2018 03/24/2018 11/17/2017  Falls in the past year? 0 0 No No No  Number falls in past yr: 0 0 - - -  Injury with Fall? 0 0 - - -  Risk for fall due to : - - - - -  Risk for fall due to: Comment - - - - -  Follow up Falls prevention discussed Falls evaluation completed - - -   FALL RISK PREVENTION PERTAINING TO THE HOME:  Any stairs in or around the home? Yes  If so, do they handrails? Yes Home free of loose throw rugs in walkways, pet beds, electrical cords, etc? Yes  Adequate lighting in your home to reduce risk of falls? Yes   ASSISTIVE DEVICES UTILIZED TO PREVENT FALLS:  Life alert? No  Use of a cane, walker or w/c? No  Grab bars in the bathroom? Yes  Shower chair or bench in shower? No  Elevated toilet seat or a handicapped toilet? No   DME ORDERS:  DME order needed?  No   TIMED UP AND GO:  Was the test performed? Yes  Length of time to ambulate 10 feet: 5 sec.   GAIT:  Appearance of gait: Gait stead-fast and without the use of an assistive device.    Education: Fall risk prevention has been discussed.  Intervention(s) required? No    Depression Screen PHQ 2/9 Scores  01/26/2019 11/19/2018 04/20/2018 03/24/2018  PHQ - 2 Score 0 0 0 0  PHQ- 9 Score - - - -     Cognitive Function     6CIT Screen 01/26/2019 11/02/2017 10/29/2016  What Year? 0 points 0 points 0 points  What month? 0 points 0 points 0 points  What time? 0 points 0 points 0 points  Count back from 20 0 points 0 points 0 points  Months in reverse 0 points 0 points 0 points  Repeat phrase 0 points 2 points 0 points  Total Score 0 2 0    Immunization History  Administered Date(s) Administered  . Influenza, High Dose Seasonal PF 04/27/2017  . Influenza-Unspecified 04/27/2015, 04/30/2018  . Pneumococcal Conjugate-13 06/29/2014  . Pneumococcal Polysaccharide-23 10/30/2008  . Td 10/30/2008  . Zoster 10/30/2008  . Zoster Recombinat (Shingrix) 04/07/2018, 06/09/2018    Qualifies for Shingles Vaccine? Yes  Zostavax completed 2010. Shingrix series complete.   Tdap: Although this vaccine is not a covered service during a Wellness Exam, does the patient still wish to receive this vaccine today?  No .  Education has been provided regarding the importance of this vaccine. Advised may receive this vaccine at local pharmacy or Health Dept. Aware to provide a copy of the vaccination record if obtained from local pharmacy or Health Dept. Verbalized acceptance and understanding.  Flu Vaccine: Up to date  Pneumococcal Vaccine: Up to date    Screening Tests Health Maintenance  Topic Date Due  . TETANUS/TDAP  11/19/2019 (Originally 10/31/2018)  . INFLUENZA VACCINE  02/19/2019  . MAMMOGRAM  07/21/2019  . DEXA SCAN  Completed  . PNA vac Low Risk Adult  Completed    Cancer Screenings:  Colorectal Screening: Completed 04/17/10.  No longer required.  Mammogram: Completed 07/20/18. Repeat every year..   Bone Density: Completed 11/12/16. Results reflect OSTEOPENIA. Repeat every 2 years. Pt declined repeat screening at this time.   Lung Cancer Screening: (Low Dose CT Chest recommended if Age 52-80 years,  30 pack-year currently smoking OR have quit w/in 15years.) does not qualify.   Additional Screening:  Hepatitis C Screening: no longer required  Vision Screening: Recommended annual ophthalmology exams for early detection of glaucoma and other disorders of the eye. Is the patient up to date with their annual eye exam?  Yes  Who is the provider or what is the name of the office in which the pt attends annual eye exams? [facility] Wallace Going  Dental Screening: Recommended annual dental exams for proper oral hygiene  Community Resource Referral:  CRR required this visit?  No      Plan:     I have personally reviewed and addressed the Medicare Annual Wellness questionnaire and have noted the following in the patient's chart:  A. Medical and social history B. Use of alcohol, tobacco or illicit drugs  C. Current medications and supplements D. Functional ability and status E.  Nutritional status F.  Physical activity G. Advance directives H. List of other physicians I.  Hospitalizations, surgeries, and ER visits in previous 12 months J.  [place] such as hearing and vision if needed, cognitive and depression L. Referrals and appointments   In addition, I have reviewed and discussed with patient certain preventive protocols, quality metrics, and best practice recommendations. A written personalized care plan for preventive services as well as general preventive health recommendations were provided to patient.   Signed,  Clemetine Marker, LPN Nurse Health Advisor   Nurse Notes: Pt doing well and appreciative of visit today.

## 2019-01-31 ENCOUNTER — Other Ambulatory Visit: Payer: Self-pay | Admitting: Internal Medicine

## 2019-01-31 DIAGNOSIS — E785 Hyperlipidemia, unspecified: Secondary | ICD-10-CM

## 2019-02-14 LAB — HM DIABETES EYE EXAM

## 2019-02-15 ENCOUNTER — Encounter: Payer: Self-pay | Admitting: Internal Medicine

## 2019-03-04 ENCOUNTER — Encounter: Payer: Self-pay | Admitting: Internal Medicine

## 2019-03-04 ENCOUNTER — Ambulatory Visit (INDEPENDENT_AMBULATORY_CARE_PROVIDER_SITE_OTHER): Payer: Medicare Other | Admitting: Internal Medicine

## 2019-03-04 ENCOUNTER — Other Ambulatory Visit: Payer: Self-pay

## 2019-03-04 ENCOUNTER — Telehealth: Payer: Self-pay | Admitting: Internal Medicine

## 2019-03-04 VITALS — BP 114/68 | HR 68 | Temp 97.3°F | Ht 65.0 in | Wt 154.0 lb

## 2019-03-04 DIAGNOSIS — K140 Glossitis: Secondary | ICD-10-CM | POA: Diagnosis not present

## 2019-03-04 DIAGNOSIS — K900 Celiac disease: Secondary | ICD-10-CM

## 2019-03-04 MED ORDER — NYSTATIN 100000 UNIT/ML MT SUSP: 5.0000 mL | Freq: Four times a day (QID) | OROMUCOSAL | 0 refills | Status: AC

## 2019-03-04 NOTE — Telephone Encounter (Signed)
Can she come in this morning at 1020? Thank you.

## 2019-03-04 NOTE — Telephone Encounter (Signed)
Patient complain of throat is a little raspy, red spot on tongue that is getting bigger. No fever.

## 2019-03-04 NOTE — Progress Notes (Signed)
Date:  03/04/2019   Name:  Jill Dunlap   DOB:  [birth year]   MRN:  [id number]   Chief Complaint: Spot on Tongue (Red spot on tongue started yesterday that is growing in size. Slight scratchy throat. No fever or cough.)  Mouth Lesions  The current episode started 2 days ago. The onset was gradual. The problem has been gradually worsening. The problem is mild. Nothing relieves the symptoms. Nothing aggravates the symptoms. Associated symptoms include mouth sores and sore throat. Pertinent negatives include no fever, no constipation, no diarrhea, no congestion, no ear pain, no headaches and no cough. She has received no recent medical care.    Review of Systems  Constitutional: Negative for chills, fatigue and fever.  HENT: Positive for mouth sores, sinus pressure and sore throat. Negative for congestion, ear pain and trouble swallowing.   Respiratory: Negative for cough, chest tightness and shortness of breath.   Cardiovascular: Negative for leg swelling.  Gastrointestinal: Negative for constipation and diarrhea.  Allergic/Immunologic: Positive for environmental allergies.  Neurological: Negative for dizziness, light-headedness and headaches.    Patient Active Problem List   Diagnosis Date Noted  . Diffuse photodamage of skin 02/13/2016  . Basal cell carcinoma 01/11/2016  . Gastroesophageal reflux disease without esophagitis 07/03/2015  . Prediabetes 03/15/2015  . Sprain of rotator cuff capsule 01/11/2015  . Acquired hypothyroidism 10/31/2014  . Allergic rhinitis 10/31/2014  . Benign hypertension 10/31/2014  . CD (celiac disease) 10/31/2014  . Dyslipidemia 10/31/2014  . OP (osteoporosis) 10/31/2014    Allergies  Allergen Reactions  . Ciprofloxacin Hcl Rash    Past Surgical History:  Procedure Laterality Date  . ABDOMINAL HYSTERECTOMY  1984   for bleeding  . Basil cell    . BILATERAL SALPINGOOPHORECTOMY  1984  . CATARACT EXTRACTION W/PHACO Right 11/24/2016   Procedure: CATARACT EXTRACTION PHACO AND INTRAOCULAR LENS PLACEMENT ([place]) Right;  Surgeon: Leandrew Koyanagi, MD;  Location: [facility];  Service: Ophthalmology;  Laterality: Right;  . CATARACT EXTRACTION W/PHACO Left 02/04/2017   Procedure: CATARACT EXTRACTION PHACO AND INTRAOCULAR LENS PLACEMENT ([place])  Left;  Surgeon: Leandrew Koyanagi, MD;  Location: [employer];  Service: Ophthalmology;  Laterality: Left;  . ORIF WRIST FRACTURE Left 2015  . TYMPANOPLASTY  1992    Social History   Tobacco Use  . Smoking status: Never Smoker  . Smokeless tobacco: Never Used  . Tobacco comment: smoking cessation materials not required  Substance Use Topics  . Alcohol use: No    Alcohol/week: 0.0 standard drinks  . Drug use: No     Medication list has been reviewed and updated.  Current Meds  Medication Sig  . azelastine (ASTELIN) 0.1 % nasal spray INSTILL 2 SPRAYS IN EACH NOSTRIL TWICE DAILY AS DIRECTED  . Biotin 2500 MCG CAPS Take 1 capsule by mouth daily.  . Cranberry 500 MG CAPS Take 1 capsule by mouth daily.   . fexofenadine (ALLEGRA) 180 MG tablet Take 1 tablet by mouth daily.  . fluticasone (FLONASE) 50 MCG/ACT nasal spray PLACE 2 SPRAYS INTO BOTH NOSTRILS DAILY.  Marland Kitchen Glucosamine-Chondroitin (MOVE FREE PO) Take by mouth daily.  Marland Kitchen levothyroxine (SYNTHROID) 50 MCG tablet TAKE ONE TABLET BY MOUTH DAILY BEFORE BREAKFAST.  Marland Kitchen lisinopril-hydrochlorothiazide (PRINZIDE,ZESTORETIC) 20-12.5 MG tablet Take 1 tablet by mouth daily.  . Multiple Vitamins-Minerals (CENTRUM SILVER PO) Take by mouth daily.  . Probiotic Product (PROBIOTIC ADVANCED PO) Take 1 tablet by mouth daily as needed.   . simvastatin (ZOCOR) 20 MG tablet  TAKE ONE TABLET BY MOUTH DAILY AT 6PM.  . TURMERIC PO Take 1 tablet by mouth daily.     PHQ 2/9 Scores 03/04/2019 01/26/2019 11/19/2018 04/20/2018  PHQ - 2 Score 0 0 0 0  PHQ- 9 Score - - - -    BP Readings from Last 3 Encounters:  03/04/19 114/68  01/26/19 114/66   11/19/18 124/60    Physical Exam Vitals signs and nursing note reviewed.  Constitutional:      General: She is not in acute distress.    Appearance: Normal appearance. She is well-developed.  HENT:     Head: Normocephalic and atraumatic.     Mouth/Throat:     Lips: Pink.     Mouth: Mucous membranes are moist.     Tongue: Lesions present.     Palate: No mass and lesions.     Pharynx: Oropharynx is clear. No oropharyngeal exudate, posterior oropharyngeal erythema or uvula swelling.   Neck:     Musculoskeletal: Normal range of motion.  Cardiovascular:     Rate and Rhythm: Normal rate and regular rhythm.  Pulmonary:     Effort: Pulmonary effort is normal. No respiratory distress.     Breath sounds: No wheezing or rhonchi.  Musculoskeletal: Normal range of motion.  Lymphadenopathy:     Cervical: No cervical adenopathy.  Skin:    General: Skin is warm and dry.     Findings: No rash.  Neurological:     Mental Status: She is alert and oriented to person, place, and time.  Psychiatric:        Behavior: Behavior normal.        Thought Content: Thought content normal.     Wt Readings from Last 3 Encounters:  03/04/19 154 lb (69.9 kg)  01/26/19 153 lb (69.4 kg)  11/19/18 154 lb (69.9 kg)    BP 114/68   Pulse 68   Temp (!) 97.3 F (36.3 C) (Oral)   Ht 5' 5"  (1.651 m)   Wt 154 lb (69.9 kg)   SpO2 97%   BMI 25.63 kg/m   Assessment and Plan: 1. Glossitis Could be manifestation celiac, vitamin or iron deficiency Will check labs Pt denies GI issues, recent antibiotics, gluten free diet non compliance - Vitamin B12 - Iron, TIBC and Ferritin Panel - nystatin (MYCOSTATIN) 100000 UNIT/ML suspension; Take 5 mLs (500,000 Units total) by mouth 4 (four) times daily for 10 days.  Dispense: 473 mL; Refill: 0  2. CD (celiac disease) Continue gluten free diet - Iron, TIBC and Ferritin Panel   Partially dictated using Editor, commissioning. Any errors are unintentional.  Halina Maidens, MD [facility]  03/04/2019

## 2019-03-05 LAB — IRON,TIBC AND FERRITIN PANEL
Ferritin: 138 ng/mL (ref 15–150)
Iron Saturation: 35 % (ref 15–55)
Iron: 115 ug/dL (ref 27–139)
Total Iron Binding Capacity: 327 ug/dL (ref 250–450)
UIBC: 212 ug/dL (ref 118–369)

## 2019-03-05 LAB — VITAMIN B12: Vitamin B-12: 1685 pg/mL — ABNORMAL HIGH (ref 232–1245)

## 2019-04-06 ENCOUNTER — Encounter: Payer: Self-pay | Admitting: Internal Medicine

## 2019-04-06 ENCOUNTER — Telehealth: Payer: Self-pay

## 2019-04-06 NOTE — Telephone Encounter (Signed)
Pt went to see her cardiologist yesterday. She said that on her AVS from [facility] that she has CKD Stage 3. This made her concerned because she said she has never been told this. I explained to her that I do NOT have this dx in our records so she will need to contact her cardiologist to find out where this is coming from.  She called me back after speaking to cardiology stating that they told her by their labs she does have CKD Stage 3. She is very concerned. She wants to know what this means for her and is she at risk for going into kidney failure. She said we have never told her this based off of our labs.  Please advise??

## 2019-04-06 NOTE — Telephone Encounter (Signed)
Her labs with me have been normal.  The last renal function test showed a GFR of 59, with normal being greater than 59.  So she is just on the edge of abnormal.  This is most likely due to age and HTN and she does not need to be worried at this time.  Because of the way CKD is staged, Stage III is the stage she is currently in.   In addition, renal labs can be dependent on the state of hydration at the time they are drawn, so the next ones may be normal again.  The goal at this point is control of BP and blood sugar.  I will recheck her labs at her next appointment and we can discuss this in greater detail at that time.

## 2019-04-07 ENCOUNTER — Telehealth: Payer: Self-pay

## 2019-04-07 NOTE — Telephone Encounter (Signed)
Advised and patient is feeling better about this though she expressed having not been told she had CKD was a concern. I advised her that it is not a concern and that stage can change and it comes with territory of HTN and meds and dehydration as well as age. She thanked me and is now okay to wait until November to talk with [facility] about this all and recheck her GFR.

## 2019-04-07 NOTE — Telephone Encounter (Signed)
mychart sent while PCP on PAL......  Left a message with your nurse  again today ( 16th) re. latest visit with Dr. Raliegh Ip (cardiologist/[facility]) in which he included diagnosis of Stage 3 CKD as an entry on my chart.  Talked with your nurse yesterday ( 15th) she said she saw no indication on labs you ordered when I was in last time...told me to call [facility]...they checked my records & responded that indeed I am in Stage 3 CKD.  Now.Marland KitchenMarland KitchenI need confirmation from you...and what my care should be.  I'll admit, I'm more than a little concerned! Stage 4 is kidney shut down...but you know that.  Please respond to this concern.  My  phone is set up to block calls and messages of unknown callers (I can't figure out how to change it) so return call from your official office # will be appreciated!  Terrible to be technological challenged! Thanks!! Jill Dunlap      [phone].   Response: I called patient and went over her 11/19/2018 Labs and explained the GFR was at 59 and could be from meds at that time or dehydration and many other factors. I advised her to wait for a call Monday when PCP is in and we will most likely repeat this to confirm CKD stage. Also advised to push fluids over weekend so she is hydrated for labs.

## 2019-05-05 ENCOUNTER — Telehealth: Payer: Self-pay

## 2019-05-05 NOTE — Telephone Encounter (Signed)
Patient called complaining of blow out diarrhea, and weight loss. Also said she is starting to feel weak. Did not say how long this is going on. LVM to push fluids and try imodium over the counter. Asked her to call back with how long this has been going on, and how much weight she has lost in what amount of time.  Awaiting callback before advice is given.

## 2019-05-06 ENCOUNTER — Telehealth: Payer: Self-pay

## 2019-05-06 NOTE — Telephone Encounter (Signed)
Patient called back from yesterday ( see telephone message)  Stated diarrhea has been on for a week now. Weighed 151 last week and now only weighs 147.4. Did get the flu shot last Thursday.  Spoke with Dr. Army Melia- Informed pt to eat a bland diet, drink Gatorade to stay hydrated and this should pass. If its not better by next week then to call back for further instructions.  She verbalized understanding.

## 2019-05-10 ENCOUNTER — Telehealth: Payer: Self-pay

## 2019-05-10 NOTE — Telephone Encounter (Signed)
Patient called as a FYI to let us know that her husband was in the ER last night with the same symptoms she has been experiencing and tested positive for COVID. The doctors there told her that she most likely has COVID too.  She just wanted to call and inform you.

## 2019-05-12 ENCOUNTER — Encounter: Payer: Self-pay | Admitting: Internal Medicine

## 2019-05-30 ENCOUNTER — Ambulatory Visit: Payer: Medicare Other | Admitting: Internal Medicine

## 2019-07-05 ENCOUNTER — Other Ambulatory Visit: Payer: Self-pay

## 2019-07-05 ENCOUNTER — Ambulatory Visit: Payer: Medicare Other | Admitting: Internal Medicine

## 2019-07-05 ENCOUNTER — Encounter: Payer: Self-pay | Admitting: Internal Medicine

## 2019-07-05 VITALS — BP 122/64 | HR 69 | Ht 65.0 in | Wt 143.0 lb

## 2019-07-05 DIAGNOSIS — I10 Essential (primary) hypertension: Secondary | ICD-10-CM | POA: Diagnosis not present

## 2019-07-05 DIAGNOSIS — R7303 Prediabetes: Secondary | ICD-10-CM | POA: Diagnosis not present

## 2019-07-05 NOTE — Progress Notes (Signed)
Date:  07/05/2019   Name:  Jill Dunlap   DOB:  1935/04/15   MRN:  [id number]   Chief Complaint: Hypertension  Hypertension This is a chronic problem. The problem is controlled. Pertinent negatives include no chest pain, headaches or shortness of breath. There are no associated agents to hypertension. Past treatments include ACE inhibitors and diuretics. The current treatment provides significant improvement. There is no history of kidney disease or CAD/MI.  Diabetes She presents for her follow-up diabetic visit. Diabetes type: pre-diabetes. Her disease course has been stable. There are no hypoglycemic associated symptoms. Pertinent negatives for hypoglycemia include no dizziness, headaches or nervousness/anxiousness. Pertinent negatives for diabetes include no chest pain and no fatigue. Current diabetic treatment includes diet. She is compliant with treatment most of the time. Her weight is decreasing steadily.  She is emotionally stressed since her husband passed away last month after hospitalization for Covid-19 then readmission for pneumonia.  She has a daughter here in [place] and a son in [place] who are very supportive.  She is sleeping fairly well, appetite is slightly decreased.  Lab Results  Component Value Date   CREATININE 0.92 11/19/2018   BUN 19 11/19/2018   NA 138 11/19/2018   K 4.0 11/19/2018   CL 98 11/19/2018   CO2 25 11/19/2018   Lab Results  Component Value Date   CHOL 169 11/19/2018   HDL 64 11/19/2018   LDLCALC 84 11/19/2018   TRIG 103 11/19/2018   CHOLHDL 2.6 11/19/2018   Lab Results  Component Value Date   TSH 1.660 11/19/2018   Lab Results  Component Value Date   HGBA1C 6.1 (H) 11/19/2018     Review of Systems  Constitutional: Negative for chills, fatigue and fever.  HENT: Negative for trouble swallowing.   Respiratory: Negative for cough, chest tightness and shortness of breath.   Cardiovascular: Negative for chest pain and leg swelling.    Gastrointestinal: Negative for abdominal pain and diarrhea.  Musculoskeletal: Negative for arthralgias and gait problem.  Neurological: Negative for dizziness and headaches.  Psychiatric/Behavioral: Positive for dysphoric mood. Negative for sleep disturbance. The patient is not nervous/anxious.     Patient Active Problem List   Diagnosis Date Noted  . Diffuse photodamage of skin 02/13/2016  . Basal cell carcinoma 01/11/2016  . Gastroesophageal reflux disease without esophagitis 07/03/2015  . Prediabetes 03/15/2015  . Sprain of rotator cuff capsule 01/11/2015  . Acquired hypothyroidism 10/31/2014  . Allergic rhinitis 10/31/2014  . Benign hypertension 10/31/2014  . CD (celiac disease) 10/31/2014  . Dyslipidemia 10/31/2014  . OP (osteoporosis) 10/31/2014    Allergies  Allergen Reactions  . Ciprofloxacin Hcl Rash    Past Surgical History:  Procedure Laterality Date  . ABDOMINAL HYSTERECTOMY  1984   for bleeding  . Basil cell    . BILATERAL SALPINGOOPHORECTOMY  1984  . CATARACT EXTRACTION W/PHACO Right 11/24/2016   Procedure: CATARACT EXTRACTION PHACO AND INTRAOCULAR LENS PLACEMENT ([employer]) Right;  Surgeon: Leandrew Koyanagi, MD;  Location: [facility];  Service: Ophthalmology;  Laterality: Right;  . CATARACT EXTRACTION W/PHACO Left 02/04/2017   Procedure: CATARACT EXTRACTION PHACO AND INTRAOCULAR LENS PLACEMENT ([place])  Left;  Surgeon: Leandrew Koyanagi, MD;  Location: [facility];  Service: Ophthalmology;  Laterality: Left;  . ORIF WRIST FRACTURE Left 2015  . TYMPANOPLASTY  1992    Social History   Tobacco Use  . Smoking status: Never Smoker  . Smokeless tobacco: Never Used  . Tobacco comment: smoking cessation materials  not required  Substance Use Topics  . Alcohol use: No    Alcohol/week: 0.0 standard drinks  . Drug use: No     Medication list has been reviewed and updated.  Current Meds  Medication Sig  . azelastine (ASTELIN) 0.1 % nasal spray  INSTILL 2 SPRAYS IN EACH NOSTRIL TWICE DAILY AS DIRECTED  . Biotin 2500 MCG CAPS Take 1 capsule by mouth daily.  . Cranberry 500 MG CAPS Take 1 capsule by mouth daily.   . fexofenadine (ALLEGRA) 180 MG tablet Take 1 tablet by mouth daily.  . fluticasone (FLONASE) 50 MCG/ACT nasal spray PLACE 2 SPRAYS INTO BOTH NOSTRILS DAILY.  Marland Kitchen Glucosamine-Chondroitin (MOVE FREE PO) Take by mouth daily.  Marland Kitchen levothyroxine (SYNTHROID) 50 MCG tablet TAKE ONE TABLET BY MOUTH DAILY BEFORE BREAKFAST.  Marland Kitchen lisinopril-hydrochlorothiazide (PRINZIDE,ZESTORETIC) 20-12.5 MG tablet Take 1 tablet by mouth daily.  . Multiple Vitamins-Minerals (CENTRUM SILVER PO) Take by mouth daily.  . Probiotic Product (PROBIOTIC ADVANCED PO) Take 1 tablet by mouth daily as needed.   . simvastatin (ZOCOR) 20 MG tablet TAKE ONE TABLET BY MOUTH DAILY AT 6PM.  . TURMERIC PO Take 1 tablet by mouth daily.     PHQ 2/9 Scores 07/05/2019 03/04/2019 01/26/2019 11/19/2018  PHQ - 2 Score 2 0 0 0  PHQ- 9 Score 3 - - -    BP Readings from Last 3 Encounters:  07/05/19 122/64  03/04/19 114/68  01/26/19 114/66    Physical Exam Vitals and nursing note reviewed.  Constitutional:      General: She is not in acute distress.    Appearance: Normal appearance. She is well-developed.  HENT:     Head: Normocephalic and atraumatic.  Cardiovascular:     Rate and Rhythm: Normal rate and regular rhythm.     Pulses: Normal pulses.     Heart sounds: No murmur.  Pulmonary:     Effort: Pulmonary effort is normal. No respiratory distress.     Breath sounds: No wheezing or rhonchi.  Musculoskeletal:     Cervical back: Normal range of motion.     Right lower leg: No edema.     Left lower leg: No edema.  Lymphadenopathy:     Cervical: No cervical adenopathy.  Skin:    General: Skin is warm and dry.     Findings: No rash.  Neurological:     General: No focal deficit present.     Mental Status: She is alert and oriented to person, place, and time.    Psychiatric:        Behavior: Behavior normal.        Thought Content: Thought content normal.     Wt Readings from Last 3 Encounters:  07/05/19 143 lb (64.9 kg)  03/04/19 154 lb (69.9 kg)  01/26/19 153 lb (69.4 kg)    BP 122/64   Pulse 69   Ht 5' 5"  (1.651 m)   Wt 143 lb (64.9 kg)   SpO2 99%   BMI 23.80 kg/m   Assessment and Plan: 1. Benign hypertension Clinically stable exam with well controlled BP.   Tolerating medications, lisinopril hct, without side effects at this time. Pt to continue current regimen and low sodium diet; benefits of regular exercise as able discussed. - Basic metabolic panel  2. Prediabetes Likely improved with weight loss - will obtain A1C and advise - Hemoglobin A1c   Partially dictated using Editor, commissioning. Any errors are unintentional.  Halina Maidens, MD [facility]  07/05/2019

## 2019-07-06 LAB — BASIC METABOLIC PANEL
BUN/Creatinine Ratio: 18 (ref 12–28)
BUN: 13 mg/dL (ref 8–27)
CO2: 24 mmol/L (ref 20–29)
Calcium: 9.9 mg/dL (ref 8.7–10.3)
Chloride: 99 mmol/L (ref 96–106)
Creatinine, Ser: 0.74 mg/dL (ref 0.57–1.00)
GFR calc Af Amer: 87 mL/min/{1.73_m2} (ref >59)
GFR calc non Af Amer: 76 mL/min/{1.73_m2} (ref >59)
Glucose: 126 mg/dL — ABNORMAL HIGH (ref 65–99)
Potassium: 4.4 mmol/L (ref 3.5–5.2)
Sodium: 136 mmol/L (ref 134–144)

## 2019-07-06 LAB — HEMOGLOBIN A1C
Est. average glucose Bld gHb Est-mCnc: 126 mg/dL
Hgb A1c MFr Bld: 6 % — ABNORMAL HIGH (ref 4.8–5.6)

## 2019-11-01 DIAGNOSIS — J301 Allergic rhinitis due to pollen: Secondary | ICD-10-CM | POA: Diagnosis not present

## 2019-11-01 DIAGNOSIS — H6122 Impacted cerumen, left ear: Secondary | ICD-10-CM | POA: Diagnosis not present

## 2019-11-21 ENCOUNTER — Ambulatory Visit (INDEPENDENT_AMBULATORY_CARE_PROVIDER_SITE_OTHER): Payer: Medicare PPO | Admitting: Internal Medicine

## 2019-11-21 ENCOUNTER — Other Ambulatory Visit: Payer: Self-pay | Admitting: Internal Medicine

## 2019-11-21 ENCOUNTER — Other Ambulatory Visit: Payer: Self-pay

## 2019-11-21 ENCOUNTER — Encounter: Payer: Self-pay | Admitting: Internal Medicine

## 2019-11-21 VITALS — BP 108/64 | HR 67 | Temp 97.1°F | Ht 65.0 in | Wt 146.0 lb

## 2019-11-21 DIAGNOSIS — Z0000 Encounter for general adult medical examination without abnormal findings: Secondary | ICD-10-CM | POA: Diagnosis not present

## 2019-11-21 DIAGNOSIS — I10 Essential (primary) hypertension: Secondary | ICD-10-CM

## 2019-11-21 DIAGNOSIS — Z1231 Encounter for screening mammogram for malignant neoplasm of breast: Secondary | ICD-10-CM | POA: Diagnosis not present

## 2019-11-21 DIAGNOSIS — R8271 Bacteriuria: Secondary | ICD-10-CM

## 2019-11-21 DIAGNOSIS — E039 Hypothyroidism, unspecified: Secondary | ICD-10-CM

## 2019-11-21 DIAGNOSIS — R7303 Prediabetes: Secondary | ICD-10-CM | POA: Diagnosis not present

## 2019-11-21 DIAGNOSIS — K900 Celiac disease: Secondary | ICD-10-CM | POA: Diagnosis not present

## 2019-11-21 DIAGNOSIS — E785 Hyperlipidemia, unspecified: Secondary | ICD-10-CM | POA: Diagnosis not present

## 2019-11-21 LAB — POCT URINALYSIS DIPSTICK
Bilirubin, UA: NEGATIVE
Glucose, UA: NEGATIVE
Ketones, UA: NEGATIVE
Nitrite, UA: POSITIVE
Protein, UA: NEGATIVE
Spec Grav, UA: 1.015 (ref 1.010–1.025)
Urobilinogen, UA: 0.2 E.U./dL
pH, UA: 5 (ref 5.0–8.0)

## 2019-11-21 NOTE — Progress Notes (Signed)
Date:  11/21/2019   Name:  Jill Dunlap   DOB:  Jul 19, 1935   MRN:  [id number]   Chief Complaint: Annual Exam (Breast Exam. No pap.) Jill Dunlap is a 85 y.o. female who presents today for her Complete Annual Exam. She feels fairly well. She reports exercising walking. She reports she is sleeping fairly well. She denies breast issues.  Mammogram  06/2018 DEXA  10/2016 Colonoscopy - aged out Immunization History  Administered Date(s) Administered  . Influenza, High Dose Seasonal PF 04/27/2017, 04/28/2019  . Influenza-Unspecified 04/27/2015, 04/30/2018  . PFIZER SARS-COV-2 Vaccination 07/27/2019, 08/17/2019  . Pneumococcal Conjugate-13 06/29/2014  . Pneumococcal Polysaccharide-23 10/30/2008  . Td 10/30/2008  . Zoster 10/30/2008  . Zoster Recombinat (Shingrix) 04/07/2018, 06/09/2018    Hypertension This is a chronic problem. The problem is controlled (at home 134/70). Associated symptoms include palpitations (lasting a minute). Pertinent negatives include no chest pain, headaches or shortness of breath. Past treatments include ACE inhibitors and diuretics. The current treatment provides significant improvement. Identifiable causes of hypertension include a thyroid problem.  Hyperlipidemia The problem is controlled. Pertinent negatives include no chest pain or shortness of breath. Current antihyperlipidemic treatment includes statins. The current treatment provides significant improvement of lipids.  Diabetes She presents for her follow-up diabetic visit. Diabetes type: pre-diabetes. Her disease course has been stable. Pertinent negatives for hypoglycemia include no dizziness, headaches, nervousness/anxiousness or tremors. Pertinent negatives for diabetes include no chest pain, no fatigue, no polydipsia and no polyuria. Current diabetic treatment includes diet. An ACE inhibitor/angiotensin II receptor blocker is being taken.  Thyroid Problem Presents for follow-up visit.  Symptoms include palpitations (lasting a minute). Patient reports no anxiety, constipation, diarrhea, fatigue or tremors. The symptoms have been stable. Her past medical history is significant for hyperlipidemia.  Celiac disease - she continues on gluten free diet.  Ate a small bite of cookie the other day and had vomiting for three hours.  Lab Results  Component Value Date   CREATININE 0.74 07/05/2019   BUN 13 07/05/2019   NA 136 07/05/2019   K 4.4 07/05/2019   CL 99 07/05/2019   CO2 24 07/05/2019   Lab Results  Component Value Date   CHOL 169 11/19/2018   HDL 64 11/19/2018   LDLCALC 84 11/19/2018   TRIG 103 11/19/2018   CHOLHDL 2.6 11/19/2018   Lab Results  Component Value Date   TSH 1.660 11/19/2018   Lab Results  Component Value Date   HGBA1C 6.0 (H) 07/05/2019   Lab Results  Component Value Date   WBC 7.3 11/19/2018   HGB 13.9 11/19/2018   HCT 42.3 11/19/2018   MCV 91 11/19/2018   PLT 170 11/19/2018   Lab Results  Component Value Date   ALT 19 11/19/2018   AST 23 11/19/2018   ALKPHOS 31 (L) 11/19/2018   BILITOT 0.5 11/19/2018     Review of Systems  Constitutional: Negative for chills, fatigue and fever.  HENT: Positive for hearing loss. Negative for congestion, tinnitus, trouble swallowing and voice change.   Eyes: Negative for visual disturbance.  Respiratory: Negative for cough, chest tightness, shortness of breath and wheezing.   Cardiovascular: Positive for palpitations (lasting a minute). Negative for chest pain and leg swelling.  Gastrointestinal: Negative for abdominal pain, constipation, diarrhea and vomiting.  Endocrine: Negative for polydipsia and polyuria.  Genitourinary: Negative for dysuria, frequency, genital sores, vaginal bleeding and vaginal discharge.  Musculoskeletal: Negative for arthralgias, gait problem and joint swelling.  Skin:  Negative for color change and rash.  Neurological: Negative for dizziness, tremors, light-headedness and  headaches.  Hematological: Negative for adenopathy. Does not bruise/bleed easily.  Psychiatric/Behavioral: Negative for dysphoric mood and sleep disturbance. The patient is not nervous/anxious.     Patient Active Problem List   Diagnosis Date Noted  . Diffuse photodamage of skin 02/13/2016  . Basal cell carcinoma 01/11/2016  . Gastroesophageal reflux disease without esophagitis 07/03/2015  . Prediabetes 03/15/2015  . Sprain of rotator cuff capsule 01/11/2015  . Acquired hypothyroidism 10/31/2014  . Allergic rhinitis 10/31/2014  . Benign hypertension 10/31/2014  . CD (celiac disease) 10/31/2014  . Dyslipidemia 10/31/2014  . OP (osteoporosis) 10/31/2014    Allergies  Allergen Reactions  . Ciprofloxacin Hcl Rash    Past Surgical History:  Procedure Laterality Date  . ABDOMINAL HYSTERECTOMY  1984   for bleeding  . Basil cell    . BILATERAL SALPINGOOPHORECTOMY  1984  . CATARACT EXTRACTION W/PHACO Right 11/24/2016   Procedure: CATARACT EXTRACTION PHACO AND INTRAOCULAR LENS PLACEMENT ([place]) Right;  Surgeon: Leandrew Koyanagi, MD;  Location: [place];  Service: Ophthalmology;  Laterality: Right;  . CATARACT EXTRACTION W/PHACO Left 02/04/2017   Procedure: CATARACT EXTRACTION PHACO AND INTRAOCULAR LENS PLACEMENT ([place])  Left;  Surgeon: Leandrew Koyanagi, MD;  Location: [place];  Service: Ophthalmology;  Laterality: Left;  . ORIF WRIST FRACTURE Left 2015  . TYMPANOPLASTY  1992    Social History   Tobacco Use  . Smoking status: Never Smoker  . Smokeless tobacco: Never Used  . Tobacco comment: smoking cessation materials not required  Substance Use Topics  . Alcohol use: No    Alcohol/week: 0.0 standard drinks  . Drug use: No     Medication list has been reviewed and updated.  Current Meds  Medication Sig  . azelastine (ASTELIN) 0.1 % nasal spray INSTILL 2 SPRAYS IN EACH NOSTRIL TWICE DAILY AS DIRECTED  . Biotin 2500 MCG CAPS Take 1 capsule by mouth  daily.  . Cranberry 500 MG CAPS Take 1 capsule by mouth daily.   . fexofenadine (ALLEGRA) 180 MG tablet Take 1 tablet by mouth daily.  . fluticasone (FLONASE) 50 MCG/ACT nasal spray PLACE 2 SPRAYS INTO BOTH NOSTRILS DAILY.  Marland Kitchen Glucosamine-Chondroitin (MOVE FREE PO) Take by mouth daily.  Marland Kitchen levothyroxine (SYNTHROID) 50 MCG tablet TAKE ONE TABLET BY MOUTH DAILY BEFORE BREAKFAST.  Marland Kitchen lisinopril-hydrochlorothiazide (PRINZIDE,ZESTORETIC) 20-12.5 MG tablet Take 1 tablet by mouth daily.  . Multiple Vitamins-Minerals (CENTRUM SILVER PO) Take by mouth daily.  . Probiotic Product (PROBIOTIC ADVANCED PO) Take 1 tablet by mouth daily as needed.   . simvastatin (ZOCOR) 20 MG tablet TAKE ONE TABLET BY MOUTH DAILY AT 6PM.  . TURMERIC PO Take 1 tablet by mouth daily.     PHQ 2/9 Scores 11/21/2019 07/05/2019 03/04/2019 01/26/2019  PHQ - 2 Score 2 2 0 0  PHQ- 9 Score 3 3 - -    BP Readings from Last 3 Encounters:  11/21/19 108/64  07/05/19 122/64  03/04/19 114/68    Physical Exam Vitals and nursing note reviewed.  Constitutional:      General: She is not in acute distress.    Appearance: She is well-developed.  HENT:     Head: Normocephalic and atraumatic.     Nose:     Right Sinus: No maxillary sinus tenderness.     Left Sinus: No maxillary sinus tenderness.  Eyes:     General: No scleral icterus.  Right eye: No discharge.        Left eye: No discharge.     Conjunctiva/sclera: Conjunctivae normal.  Neck:     Thyroid: No thyromegaly.     Vascular: No carotid bruit.  Cardiovascular:     Rate and Rhythm: Normal rate and regular rhythm.     Pulses: Normal pulses.     Heart sounds: Normal heart sounds.  Pulmonary:     Effort: Pulmonary effort is normal. No respiratory distress.     Breath sounds: No wheezing.  Chest:     Breasts:        Right: No mass, nipple discharge, skin change or tenderness.        Left: No mass, nipple discharge, skin change or tenderness.  Abdominal:     General:  Bowel sounds are normal.     Palpations: Abdomen is soft.     Tenderness: There is no abdominal tenderness.  Musculoskeletal:        General: Normal range of motion.     Cervical back: Normal range of motion. No erythema.     Right lower leg: No edema.     Left lower leg: No edema.  Lymphadenopathy:     Cervical: No cervical adenopathy.  Skin:    General: Skin is warm and dry.     Capillary Refill: Capillary refill takes less than 2 seconds.     Findings: No rash.  Neurological:     General: No focal deficit present.     Mental Status: She is alert and oriented to person, place, and time.     Cranial Nerves: No cranial nerve deficit.     Sensory: No sensory deficit.     Deep Tendon Reflexes: Reflexes are normal and symmetric.  Psychiatric:        Speech: Speech normal.        Behavior: Behavior normal.        Thought Content: Thought content normal.     Wt Readings from Last 3 Encounters:  11/21/19 146 lb (66.2 kg)  07/05/19 143 lb (64.9 kg)  03/04/19 154 lb (69.9 kg)    BP 108/64   Pulse 67   Temp (!) 97.1 F (36.2 C) (Temporal)   Ht 5' 5"  (1.651 m)   Wt 146 lb (66.2 kg)   SpO2 98%   BMI 24.30 kg/m   Assessment and Plan: 1. Annual physical exam Normal exam Continue regular exercise, healthy gluten free diet - POCT urinalysis dipstick  2. Benign hypertension Clinically stable exam with well controlled BP. Tolerating medications without side effects at this time. Occasional palpitations - call or follow up if worsening Pt to continue current regimen and low sodium diet; benefits of regular exercise as able discussed. - CBC with Differential/Platelet - Comprehensive metabolic panel  3. Acquired hypothyroidism supplemented - TSH + free T4  4. Dyslipidemia Tolerating statin medication without side effects at this time Continue same therapy without change at this time. - Lipid panel  5. Prediabetes Stable with healthy diet and normal weight - Hemoglobin  A1c  6. CD (celiac disease) Continue gluten free diet  7. Encounter for screening mammogram for breast cancer - MM 3D SCREEN BREAST BILATERAL; Future  8. Bacteria in urine Patient is asymptomatic so will send for culture - Urine Culture   Partially dictated using Dragon software. Any errors are unintentional.  Halina Maidens, MD [facility]  11/21/2019

## 2019-11-22 LAB — COMPREHENSIVE METABOLIC PANEL
ALT: 14 IU/L (ref 0–32)
AST: 17 IU/L (ref 0–40)
Albumin/Globulin Ratio: 1.6 (ref 1.2–2.2)
Albumin: 4.6 g/dL (ref 3.6–4.6)
Alkaline Phosphatase: 34 IU/L — ABNORMAL LOW (ref 39–117)
BUN/Creatinine Ratio: 18 (ref 12–28)
BUN: 15 mg/dL (ref 8–27)
Bilirubin Total: 0.4 mg/dL (ref 0.0–1.2)
CO2: 23 mmol/L (ref 20–29)
Calcium: 10.2 mg/dL (ref 8.7–10.3)
Chloride: 102 mmol/L (ref 96–106)
Creatinine, Ser: 0.84 mg/dL (ref 0.57–1.00)
GFR calc Af Amer: 75 mL/min/{1.73_m2} (ref >59)
GFR calc non Af Amer: 65 mL/min/{1.73_m2} (ref >59)
Globulin, Total: 2.8 g/dL (ref 1.5–4.5)
Glucose: 110 mg/dL — ABNORMAL HIGH (ref 65–99)
Potassium: 4.5 mmol/L (ref 3.5–5.2)
Sodium: 140 mmol/L (ref 134–144)
Total Protein: 7.4 g/dL (ref 6.0–8.5)

## 2019-11-22 LAB — LIPID PANEL
Chol/HDL Ratio: 2.4 ratio (ref 0.0–4.4)
Cholesterol, Total: 168 mg/dL (ref 100–199)
HDL: 69 mg/dL (ref >39)
LDL Chol Calc (NIH): 81 mg/dL (ref 0–99)
Triglycerides: 103 mg/dL (ref 0–149)
VLDL Cholesterol Cal: 18 mg/dL (ref 5–40)

## 2019-11-22 LAB — CBC WITH DIFFERENTIAL/PLATELET
Basophils Absolute: 0 10*3/uL (ref 0.0–0.2)
Basos: 1 %
EOS (ABSOLUTE): 0.1 10*3/uL (ref 0.0–0.4)
Eos: 1 %
Hematocrit: 41.2 % (ref 34.0–46.6)
Hemoglobin: 13.4 g/dL (ref 11.1–15.9)
Immature Grans (Abs): 0 10*3/uL (ref 0.0–0.1)
Immature Granulocytes: 0 %
Lymphocytes Absolute: 1.4 10*3/uL (ref 0.7–3.1)
Lymphs: 25 %
MCH: 29.1 pg (ref 26.6–33.0)
MCHC: 32.5 g/dL (ref 31.5–35.7)
MCV: 90 fL (ref 79–97)
Monocytes Absolute: 0.5 10*3/uL (ref 0.1–0.9)
Monocytes: 9 %
Neutrophils Absolute: 3.6 10*3/uL (ref 1.4–7.0)
Neutrophils: 64 %
Platelets: 180 10*3/uL (ref 150–450)
RBC: 4.6 x10E6/uL (ref 3.77–5.28)
RDW: 12.7 % (ref 11.7–15.4)
WBC: 5.6 10*3/uL (ref 3.4–10.8)

## 2019-11-22 LAB — HEMOGLOBIN A1C
Est. average glucose Bld gHb Est-mCnc: 131 mg/dL
Hgb A1c MFr Bld: 6.2 % — ABNORMAL HIGH (ref 4.8–5.6)

## 2019-11-22 LAB — TSH+FREE T4
Free T4: 1.11 ng/dL (ref 0.82–1.77)
TSH: 1.42 u[IU]/mL (ref 0.450–4.500)

## 2019-11-23 LAB — URINE CULTURE

## 2019-11-24 ENCOUNTER — Other Ambulatory Visit: Payer: Self-pay | Admitting: Internal Medicine

## 2019-11-24 DIAGNOSIS — N390 Urinary tract infection, site not specified: Secondary | ICD-10-CM

## 2019-11-24 DIAGNOSIS — B9620 Unspecified Escherichia coli [E. coli] as the cause of diseases classified elsewhere: Secondary | ICD-10-CM

## 2019-11-24 MED ORDER — CEFUROXIME AXETIL 250 MG PO TABS: 250.0000 mg | ORAL_TABLET | Freq: Two times a day (BID) | ORAL | 0 refills | Status: AC

## 2019-12-20 ENCOUNTER — Other Ambulatory Visit: Payer: Self-pay

## 2019-12-20 ENCOUNTER — Ambulatory Visit
Admission: RE | Admit: 2019-12-20 | Discharge: 2019-12-20 | Disposition: A | Discharge status: Home / Self Care | Payer: Medicare PPO | Source: Ambulatory Visit | Attending: Internal Medicine | Admitting: Internal Medicine

## 2019-12-20 DIAGNOSIS — Z1231 Encounter for screening mammogram for malignant neoplasm of breast: Secondary | ICD-10-CM | POA: Insufficient documentation

## 2019-12-28 ENCOUNTER — Encounter: Payer: Self-pay | Admitting: Internal Medicine

## 2019-12-28 ENCOUNTER — Other Ambulatory Visit: Payer: Self-pay

## 2019-12-28 ENCOUNTER — Ambulatory Visit (INDEPENDENT_AMBULATORY_CARE_PROVIDER_SITE_OTHER): Payer: Medicare PPO | Admitting: Internal Medicine

## 2019-12-28 VITALS — BP 116/64 | HR 69 | Ht 65.0 in | Wt 150.0 lb

## 2019-12-28 DIAGNOSIS — M791 Myalgia, unspecified site: Secondary | ICD-10-CM | POA: Diagnosis not present

## 2019-12-28 DIAGNOSIS — G8929 Other chronic pain: Secondary | ICD-10-CM

## 2019-12-28 DIAGNOSIS — F3341 Major depressive disorder, recurrent, in partial remission: Secondary | ICD-10-CM | POA: Insufficient documentation

## 2019-12-28 DIAGNOSIS — M25559 Pain in unspecified hip: Secondary | ICD-10-CM | POA: Diagnosis not present

## 2019-12-28 DIAGNOSIS — F321 Major depressive disorder, single episode, moderate: Secondary | ICD-10-CM | POA: Diagnosis not present

## 2019-12-28 MED ORDER — SERTRALINE HCL 50 MG PO TABS: 50.0000 mg | ORAL_TABLET | Freq: Every day | ORAL | 1 refills | Status: DC

## 2019-12-28 NOTE — Progress Notes (Signed)
Date:  12/28/2019   Name:  Jill Dunlap   DOB:  [birth year]   MRN:  [id number]   Chief Complaint: Generalized Body Aches (X 1 week. Hard to stand from a chair. Hips, legs, shoulders, arms hurting. Feels like muscle aches and not joints. ) and Depression  Depression        This is a new problem.  The current episode started more than 1 month ago.   The onset quality is gradual.   The problem has been gradually worsening since onset.  Associated symptoms include decreased concentration, fatigue, insomnia, decreased interest, appetite change, body aches, myalgias (upper arms) and sad.  Associated symptoms include no headaches and no suicidal ideas.     Exacerbated by: worsened since the death of her husband and with covid restrictions.  Past treatments include nothing. Hip Pain  There was no injury mechanism. The pain is present in the left hip and right hip. The quality of the pain is described as aching. The pain is moderate. The pain has been improving since onset. Associated symptoms include muscle weakness. She has tried NSAIDs for the symptoms. The treatment provided moderate relief.    Lab Results  Component Value Date   CREATININE 0.84 11/21/2019   BUN 15 11/21/2019   NA 140 11/21/2019   K 4.5 11/21/2019   CL 102 11/21/2019   CO2 23 11/21/2019   Lab Results  Component Value Date   CHOL 168 11/21/2019   HDL 69 11/21/2019   LDLCALC 81 11/21/2019   TRIG 103 11/21/2019   CHOLHDL 2.4 11/21/2019   Lab Results  Component Value Date   TSH 1.420 11/21/2019   Lab Results  Component Value Date   HGBA1C 6.2 (H) 11/21/2019   Lab Results  Component Value Date   WBC 5.6 11/21/2019   HGB 13.4 11/21/2019   HCT 41.2 11/21/2019   MCV 90 11/21/2019   PLT 180 11/21/2019   Lab Results  Component Value Date   ALT 14 11/21/2019   AST 17 11/21/2019   ALKPHOS 34 (L) 11/21/2019   BILITOT 0.4 11/21/2019     Review of Systems  Constitutional: Positive for appetite change and  fatigue.  HENT: Negative for trouble swallowing.   Respiratory: Negative for cough, chest tightness and shortness of breath.   Cardiovascular: Negative for chest pain and leg swelling.  Gastrointestinal: Negative for abdominal pain, blood in stool, constipation and diarrhea.  Musculoskeletal: Positive for arthralgias, gait problem and myalgias (upper arms). Negative for joint swelling.  Neurological: Negative for dizziness, light-headedness and headaches.  Psychiatric/Behavioral: Positive for decreased concentration, depression and sleep disturbance. Negative for behavioral problems and suicidal ideas. The patient is nervous/anxious and has insomnia.     Patient Active Problem List   Diagnosis Date Noted  . Diffuse photodamage of skin 02/13/2016  . Basal cell carcinoma 01/11/2016  . Gastroesophageal reflux disease without esophagitis 07/03/2015  . Prediabetes 03/15/2015  . Sprain of rotator cuff capsule 01/11/2015  . Acquired hypothyroidism 10/31/2014  . Allergic rhinitis 10/31/2014  . Benign hypertension 10/31/2014  . CD (celiac disease) 10/31/2014  . Dyslipidemia 10/31/2014  . OP (osteoporosis) 10/31/2014    Allergies  Allergen Reactions  . Ciprofloxacin Hcl Rash    Past Surgical History:  Procedure Laterality Date  . ABDOMINAL HYSTERECTOMY  1984   for bleeding  . Basil cell    . BILATERAL SALPINGOOPHORECTOMY  1984  . CATARACT EXTRACTION W/PHACO Right 11/24/2016   Procedure: CATARACT EXTRACTION PHACO AND INTRAOCULAR  LENS PLACEMENT ([place]) Right;  Surgeon: Leandrew Koyanagi, MD;  Location: [place];  Service: Ophthalmology;  Laterality: Right;  . CATARACT EXTRACTION W/PHACO Left 02/04/2017   Procedure: CATARACT EXTRACTION PHACO AND INTRAOCULAR LENS PLACEMENT ([place])  Left;  Surgeon: Leandrew Koyanagi, MD;  Location: [place];  Service: Ophthalmology;  Laterality: Left;  . ORIF WRIST FRACTURE Left 2015  . TYMPANOPLASTY  1992    Social History    Tobacco Use  . Smoking status: Never Smoker  . Smokeless tobacco: Never Used  . Tobacco comment: smoking cessation materials not required  Substance Use Topics  . Alcohol use: No    Alcohol/week: 0.0 standard drinks  . Drug use: No     Medication list has been reviewed and updated.  Current Meds  Medication Sig  . azelastine (ASTELIN) 0.1 % nasal spray INSTILL 2 SPRAYS IN EACH NOSTRIL TWICE DAILY AS DIRECTED  . Biotin 2500 MCG CAPS Take 1 capsule by mouth daily.  . Cranberry 500 MG CAPS Take 1 capsule by mouth daily.   Marland Kitchen doxycycline (VIBRA-TABS) 100 MG tablet Take 1 tablet by mouth in the morning and at bedtime. Cyst removal  . fexofenadine (ALLEGRA) 180 MG tablet Take 1 tablet by mouth daily.  . fluticasone (FLONASE) 50 MCG/ACT nasal spray PLACE 2 SPRAYS INTO BOTH NOSTRILS DAILY.  Marland Kitchen Glucosamine-Chondroitin (MOVE FREE PO) Take by mouth daily.  Marland Kitchen levothyroxine (SYNTHROID) 50 MCG tablet TAKE ONE TABLET BY MOUTH DAILY BEFORE BREAKFAST.  Marland Kitchen lisinopril-hydrochlorothiazide (PRINZIDE,ZESTORETIC) 20-12.5 MG tablet Take 1 tablet by mouth daily.  . Multiple Vitamins-Minerals (CENTRUM SILVER PO) Take by mouth daily.  . Probiotic Product (PROBIOTIC ADVANCED PO) Take 1 tablet by mouth daily as needed.   . simvastatin (ZOCOR) 20 MG tablet TAKE ONE TABLET BY MOUTH DAILY AT 6PM.  . TURMERIC PO Take 1 tablet by mouth daily.     PHQ 2/9 Scores 12/28/2019 11/21/2019 07/05/2019 03/04/2019  PHQ - 2 Score 2 2 2  0  PHQ- 9 Score 8 3 3  -   GAD 7 : Generalized Anxiety Score 12/28/2019 11/21/2019  Nervous, Anxious, on Edge 2 0  Control/stop worrying 3 0  Worry too much - different things 3 0  Trouble relaxing 3 0  Restless 3 0  Easily annoyed or irritable 0 0  Afraid - awful might happen 1 0  Total GAD 7 Score 15 0  Anxiety Difficulty Not difficult at all Not difficult at all    BP Readings from Last 3 Encounters:  12/28/19 116/64  11/21/19 108/64  07/05/19 122/64    Physical Exam Vitals and  nursing note reviewed.  Constitutional:      General: She is not in acute distress.    Appearance: Normal appearance. She is well-developed.  HENT:     Head: Normocephalic and atraumatic.  Cardiovascular:     Rate and Rhythm: Normal rate and regular rhythm.     Pulses: Normal pulses.  Pulmonary:     Effort: Pulmonary effort is normal. No respiratory distress.     Breath sounds: No wheezing or rhonchi.  Musculoskeletal:     Right shoulder: Normal.     Left shoulder: Normal.     Right upper arm: Tenderness present. No swelling or edema.     Left upper arm: Tenderness present. No swelling or edema.     Right hip: No tenderness or bony tenderness. Decreased range of motion. Decreased strength.     Left hip: No tenderness or bony tenderness. Decreased range of motion.  Decreased strength.     Right lower leg: No edema.     Left lower leg: No edema.  Skin:    General: Skin is warm and dry.     Findings: No rash.  Neurological:     Mental Status: She is alert and oriented to person, place, and time.     Sensory: Sensation is intact.     Motor: Motor function is intact.     Comments: Able to stand from sitting without using UEs  Psychiatric:        Attention and Perception: Attention normal.        Mood and Affect: Mood is depressed. Mood is not anxious. Affect is not angry or tearful.        Speech: Speech normal.     Wt Readings from Last 3 Encounters:  12/28/19 150 lb (68 kg)  11/21/19 146 lb (66.2 kg)  07/05/19 143 lb (64.9 kg)    BP 116/64 (BP Location: Right Arm, Patient Position: Sitting, Cuff Size: Normal)   Pulse 69   Ht 5' 5"  (1.651 m)   Wt 150 lb (68 kg)   SpO2 98%   BMI 24.96 kg/m   Assessment and Plan: 1. Current moderate episode of major depressive disorder without prior episode (HCC) Progressive sx causing moderate distress Begin medication - taper up over 6 days. Follow up in 4-6 weeks; sooner if problems/SE - sertraline (ZOLOFT) 50 MG tablet; Take 1  tablet (50 mg total) by mouth at bedtime.  Dispense: 30 tablet; Refill: 1  2. Myalgia Will check ESR - Sedimentation rate  3. Hip pain, chronic, unspecified laterality Xrays deferred today May continue to use Aleve intermittently but avoid bid daily dosing Consider imaging if worsening   Partially dictated using Editor, commissioning. Any errors are unintentional.  Halina Maidens, MD [facility]  12/28/2019

## 2019-12-28 NOTE — Patient Instructions (Addendum)
Take Zoloft 1/2 tablet for 6 days then increase to one tablet if no side effects. Follow up in 6 weeks.  Lab ordered for Polymyalgia Rheumatica (PMR)

## 2019-12-29 LAB — SEDIMENTATION RATE: Sed Rate: 20 mm/h (ref 0–40)

## 2020-01-03 DIAGNOSIS — D492 Neoplasm of unspecified behavior of bone, soft tissue, and skin: Secondary | ICD-10-CM | POA: Diagnosis not present

## 2020-01-03 DIAGNOSIS — L720 Epidermal cyst: Secondary | ICD-10-CM | POA: Diagnosis not present

## 2020-01-08 ENCOUNTER — Encounter: Payer: Self-pay | Admitting: Internal Medicine

## 2020-01-09 DIAGNOSIS — S46011A Strain of muscle(s) and tendon(s) of the rotator cuff of right shoulder, initial encounter: Secondary | ICD-10-CM | POA: Diagnosis not present

## 2020-01-09 DIAGNOSIS — S46012A Strain of muscle(s) and tendon(s) of the rotator cuff of left shoulder, initial encounter: Secondary | ICD-10-CM | POA: Diagnosis not present

## 2020-01-09 DIAGNOSIS — M13851 Other specified arthritis, right hip: Secondary | ICD-10-CM | POA: Diagnosis not present

## 2020-01-18 ENCOUNTER — Ambulatory Visit: Payer: Self-pay

## 2020-01-18 NOTE — Telephone Encounter (Signed)
Pt. Reports she continues to have weakness and "difficulty walking around, even getting in and out of my bed." Reports she also started Zoloft recently and is concerned it could be causing "some of this." States the Zoloft is helping her sleep better, although she is napping more. Wants to know what her PCP thinks. Please advise pt. Answer Assessment - Initial Assessment Questions 1. DESCRIPTION: "Describe how you are feeling."     Weakness 2. SEVERITY: "How bad is it?"  "Can you stand and walk?"   - MILD - Feels weak or tired, but does not interfere with work, school or normal activities   - [place] to stand and walk; weakness interferes with work, school, or normal activities   - SEVERE - Unable to stand or walk     Moderate 3. ONSET:  "When did the weakness begin?"     A couple of weeks ago 4. CAUSE: "What do you think is causing the weakness?"     Unsure 5. MEDICINES: "Have you recently started a new medicine or had a change in the amount of a medicine?"     Zoloft 6. OTHER SYMPTOMS: "Do you have any other symptoms?" (e.g., chest pain, fever, cough, SOB, vomiting, diarrhea, bleeding, other areas of pain)     Napping more 7. PREGNANCY: "Is there any chance you are pregnant?" "When was your last menstrual period?"     No  Protocols used: WEAKNESS (GENERALIZED) AND FATIGUE-A-AH

## 2020-01-18 NOTE — Telephone Encounter (Signed)
Pt coming in 12/25/2019

## 2020-01-18 NOTE — Telephone Encounter (Signed)
She was having muscle pain and weakness at her last visit.  I do not think it is the zoloft.  She needs an office visit but not an ER visit.

## 2020-01-19 DIAGNOSIS — M25512 Pain in left shoulder: Secondary | ICD-10-CM | POA: Diagnosis not present

## 2020-01-19 DIAGNOSIS — M25511 Pain in right shoulder: Secondary | ICD-10-CM | POA: Diagnosis not present

## 2020-01-24 ENCOUNTER — Other Ambulatory Visit: Payer: Self-pay

## 2020-01-24 ENCOUNTER — Encounter: Payer: Self-pay | Admitting: Internal Medicine

## 2020-01-24 ENCOUNTER — Ambulatory Visit (INDEPENDENT_AMBULATORY_CARE_PROVIDER_SITE_OTHER): Payer: Medicare PPO | Admitting: Internal Medicine

## 2020-01-24 VITALS — BP 126/60 | HR 63 | Temp 97.9°F | Ht 65.0 in | Wt 145.0 lb

## 2020-01-24 DIAGNOSIS — M25511 Pain in right shoulder: Secondary | ICD-10-CM | POA: Insufficient documentation

## 2020-01-24 DIAGNOSIS — K219 Gastro-esophageal reflux disease without esophagitis: Secondary | ICD-10-CM

## 2020-01-24 DIAGNOSIS — F321 Major depressive disorder, single episode, moderate: Secondary | ICD-10-CM | POA: Diagnosis not present

## 2020-01-24 DIAGNOSIS — M25512 Pain in left shoulder: Secondary | ICD-10-CM

## 2020-01-24 DIAGNOSIS — M1612 Unilateral primary osteoarthritis, left hip: Secondary | ICD-10-CM | POA: Diagnosis not present

## 2020-01-24 MED ORDER — OMEPRAZOLE 20 MG PO CPDR: 20.0000 mg | DELAYED_RELEASE_CAPSULE | Freq: Every day | ORAL | 1 refills | Status: DC

## 2020-01-24 MED ORDER — ESCITALOPRAM OXALATE 10 MG PO TABS: 10.0000 mg | ORAL_TABLET | Freq: Every day | ORAL | 1 refills | Status: DC

## 2020-01-24 NOTE — Patient Instructions (Signed)
Stop the Sertraline.  Wait a week to start taking the Lexapro.  Start Lexapro at 1/2 tablet daily for a week then increase to a whole tablet daily.

## 2020-01-24 NOTE — Progress Notes (Signed)
Date:  01/24/2020   Name:  Jill Dunlap   DOB:  09-12-1934   MRN:  [id number]   Chief Complaint: Difficulty Walking (X1 month lifted heavy furniture and twisted something moving it. hard to walk and can get out of bed. sleeping in chair.  ) and Medication Problem (zoloft- 6 days .5 tablet then moved to whole tablet made her sleep all the time spoke to pharmacy they said she could go back to .5 tablet it works but has side effects- bloating and loose bowels)  Depression        This is a new problem.  The problem has been rapidly improving since onset.  Associated symptoms include no fatigue and no headaches.  Past treatments include SSRIs - Selective serotonin reuptake inhibitors (low dose zoloft - helped but causes diarrhea).  Compliance with treatment is good.  Previous treatment provided significant relief. Shoulder Pain  The pain is present in the right shoulder and left shoulder. This is a new problem. The current episode started 1 to 4 weeks ago. The problem occurs constantly. The problem has been gradually improving. The quality of the pain is described as aching. The pain is moderate. Pertinent negatives include no fever or numbness. The symptoms are aggravated by activity. Treatments tried: seen by Ortho - recieved bilateral cortisone injections, then prednisone taper and MRI ordered. The treatment provided significant (now able to get out of bed, wash her hair, drive) relief.  Gastroesophageal Reflux She complains of heartburn. She reports no abdominal pain or no chest pain. This is a recurrent problem. The problem occurs frequently. The problem has been unchanged. The heartburn is located in the substernum. The heartburn is of moderate intensity. Pertinent negatives include no fatigue. She has tried an antacid for the symptoms. The treatment provided no relief.  Hip Pain  The incident occurred at home. Injury mechanism: carrying heavy furniture  The pain is present in the right hip  and left hip (xrays at Ortho showed hip OA). The pain is mild. Pertinent negatives include no numbness. The symptoms are aggravated by weight bearing. She has tried NSAIDs (takes aleve daily; finishing steroid taper) for the symptoms. The treatment provided moderate relief.    Lab Results  Component Value Date   CREATININE 0.84 11/21/2019   BUN 15 11/21/2019   NA 140 11/21/2019   K 4.5 11/21/2019   CL 102 11/21/2019   CO2 23 11/21/2019   Lab Results  Component Value Date   CHOL 168 11/21/2019   HDL 69 11/21/2019   LDLCALC 81 11/21/2019   TRIG 103 11/21/2019   CHOLHDL 2.4 11/21/2019   Lab Results  Component Value Date   TSH 1.420 11/21/2019   Lab Results  Component Value Date   HGBA1C 6.2 (H) 11/21/2019   Lab Results  Component Value Date   WBC 5.6 11/21/2019   HGB 13.4 11/21/2019   HCT 41.2 11/21/2019   MCV 90 11/21/2019   PLT 180 11/21/2019   Lab Results  Component Value Date   ALT 14 11/21/2019   AST 17 11/21/2019   ALKPHOS 34 (L) 11/21/2019   BILITOT 0.4 11/21/2019     Review of Systems  Constitutional: Negative for chills, fatigue, fever and unexpected weight change.  Respiratory: Negative for chest tightness and shortness of breath.   Cardiovascular: Negative for chest pain, palpitations and leg swelling.  Gastrointestinal: Positive for diarrhea and heartburn. Negative for abdominal pain and blood in stool.  Genitourinary: Negative for difficulty urinating.  Musculoskeletal: Positive for arthralgias (hips and shoulders).  Neurological: Positive for light-headedness. Negative for dizziness, weakness, numbness and headaches.  Psychiatric/Behavioral: Positive for depression, dysphoric mood (much improved) and sleep disturbance (much improved). The patient is nervous/anxious (much improved).     Patient Active Problem List   Diagnosis Date Noted  . Hip pain, chronic, unspecified laterality 12/28/2019  . Current moderate episode of major depressive disorder  without prior episode ([place]) 12/28/2019  . Diffuse photodamage of skin 02/13/2016  . Basal cell carcinoma 01/11/2016  . Gastroesophageal reflux disease without esophagitis 07/03/2015  . Prediabetes 03/15/2015  . Sprain of rotator cuff capsule 01/11/2015  . Acquired hypothyroidism 10/31/2014  . Allergic rhinitis 10/31/2014  . Benign hypertension 10/31/2014  . CD (celiac disease) 10/31/2014  . Dyslipidemia 10/31/2014  . OP (osteoporosis) 10/31/2014    Allergies  Allergen Reactions  . Ciprofloxacin Hcl Rash    Past Surgical History:  Procedure Laterality Date  . ABDOMINAL HYSTERECTOMY  1984   for bleeding  . Basil cell    . BILATERAL SALPINGOOPHORECTOMY  1984  . CATARACT EXTRACTION W/PHACO Right 11/24/2016   Procedure: CATARACT EXTRACTION PHACO AND INTRAOCULAR LENS PLACEMENT ([place]) Right;  Surgeon: Leandrew Koyanagi, MD;  Location: [place];  Service: Ophthalmology;  Laterality: Right;  . CATARACT EXTRACTION W/PHACO Left 02/04/2017   Procedure: CATARACT EXTRACTION PHACO AND INTRAOCULAR LENS PLACEMENT ([place])  Left;  Surgeon: Leandrew Koyanagi, MD;  Location: [place];  Service: Ophthalmology;  Laterality: Left;  . ORIF WRIST FRACTURE Left 2015  . TYMPANOPLASTY  1992    Social History   Tobacco Use  . Smoking status: Never Smoker  . Smokeless tobacco: Never Used  . Tobacco comment: smoking cessation materials not required  Vaping Use  . Vaping Use: Never used  Substance Use Topics  . Alcohol use: No    Alcohol/week: 0.0 standard drinks  . Drug use: No     Medication list has been reviewed and updated.  Current Meds  Medication Sig  . azelastine (ASTELIN) 0.1 % nasal spray INSTILL 2 SPRAYS IN EACH NOSTRIL TWICE DAILY AS DIRECTED  . Biotin 2500 MCG CAPS Take 1 capsule by mouth daily.  . cefUROXime (CEFTIN) 250 MG tablet cefuroxime axetil 250 mg tablet  . Cranberry 500 MG CAPS Take 1 capsule by mouth daily.   . fexofenadine (ALLEGRA) 180 MG tablet  Take 1 tablet by mouth daily.  . fluticasone (FLONASE) 50 MCG/ACT nasal spray PLACE 2 SPRAYS INTO BOTH NOSTRILS DAILY.  Marland Kitchen Glucosamine-Chondroitin (MOVE FREE PO) Take by mouth daily.  Marland Kitchen levothyroxine (SYNTHROID) 50 MCG tablet TAKE ONE TABLET BY MOUTH DAILY BEFORE BREAKFAST.  Marland Kitchen lisinopril-hydrochlorothiazide (PRINZIDE,ZESTORETIC) 20-12.5 MG tablet Take 1 tablet by mouth daily.  . Multiple Vitamins-Minerals (CENTRUM SILVER PO) Take by mouth daily.  . Omeprazole (PRILOSEC PO) Take 1 tablet by mouth.  . sertraline (ZOLOFT) 50 MG tablet Take 1 tablet (50 mg total) by mouth at bedtime.  . simvastatin (ZOCOR) 20 MG tablet TAKE ONE TABLET BY MOUTH DAILY AT 6PM.  . TURMERIC PO Take 1 tablet by mouth daily.     PHQ 2/9 Scores 01/24/2020 12/28/2019 11/21/2019 07/05/2019  PHQ - 2 Score 0 2 2 2   PHQ- 9 Score 0 8 3 3     GAD 7 : Generalized Anxiety Score 01/24/2020 12/28/2019 11/21/2019  Nervous, Anxious, on Edge 0 2 0  Control/stop worrying 0 3 0  Worry too much - different things 0 3 0  Trouble relaxing 1 3 0  Restless 0  3 0  Easily annoyed or irritable 0 0 0  Afraid - awful might happen 0 1 0  Total GAD 7 Score 1 15 0  Anxiety Difficulty Not difficult at all Not difficult at all Not difficult at all    BP Readings from Last 3 Encounters:  01/24/20 126/60  12/28/19 116/64  11/21/19 108/64    Physical Exam Vitals and nursing note reviewed.  Constitutional:      General: She is not in acute distress.    Appearance: Normal appearance. She is well-developed.  HENT:     Head: Normocephalic and atraumatic.  Cardiovascular:     Rate and Rhythm: Normal rate and regular rhythm.  Pulmonary:     Effort: Pulmonary effort is normal. No respiratory distress.     Breath sounds: No wheezing or rhonchi.  Abdominal:     General: Abdomen is flat.     Palpations: Abdomen is soft.     Tenderness: There is no abdominal tenderness. There is no guarding or rebound.  Musculoskeletal:     Cervical back: Normal range  of motion.     Right hip: No tenderness, bony tenderness or crepitus. Decreased range of motion. Normal strength.     Left hip: No tenderness, bony tenderness or crepitus. Decreased range of motion. Normal strength.     Right knee: Normal.     Left knee: Normal.     Right lower leg: No edema.  Lymphadenopathy:     Cervical: No cervical adenopathy.  Skin:    General: Skin is warm and dry.     Findings: No rash.  Neurological:     Mental Status: She is alert and oriented to person, place, and time.  Psychiatric:        Behavior: Behavior normal.        Thought Content: Thought content normal.     Wt Readings from Last 3 Encounters:  01/24/20 145 lb (65.8 kg)  12/28/19 150 lb (68 kg)  11/21/19 146 lb (66.2 kg)    BP 126/60   Pulse 63   Temp 97.9 F (36.6 C) (Oral)   Ht 5' 5"  (1.651 m)   Wt 145 lb (65.8 kg)   SpO2 97%   BMI 24.13 kg/m   Assessment and Plan: 1. Primary osteoarthritis of left hip Continue steroid taper Continue Aleve Discuss sx with Ortho if not improved  2. Current moderate episode of major depressive disorder without prior episode ([place]) Much improved on medication but with persistent diarrhea Stop sertraline; wait one week then begin lexapro Follow up one month - escitalopram (LEXAPRO) 10 MG tablet; Take 1 tablet (10 mg total) by mouth daily.  Dispense: 30 tablet; Refill: 1  3. Gastroesophageal reflux disease without esophagitis Symptoms not controlled on antacids alone No red flag signs such as weight loss, n/v, melena Will resume PPI daily - omeprazole (PRILOSEC) 20 MG capsule; Take 1 capsule (20 mg total) by mouth daily.  Dispense: 90 capsule; Refill: 1  4. Acute pain of both shoulders Follow up with Ortho after MRI Finish steroid taper Aleve PRN   Partially dictated using Editor, commissioning. Any errors are unintentional.  Halina Maidens, MD [facility]  01/24/2020

## 2020-01-26 ENCOUNTER — Telehealth: Payer: Self-pay | Admitting: Internal Medicine

## 2020-01-26 NOTE — Telephone Encounter (Signed)
Copied from [place] [phone]. Topic: Quick Communication - See Telephone Encounter >> Jan 26, 2020  3:43 PM Blase Mess A wrote: CRM for notification. See Telephone encounter for: 01/26/20.  Patient's son called to see if the patient could get appt for pain in both legs and arms for the patient. Appt was scheduled for tomorrow. Patient reports extreme pain. Please advise

## 2020-01-27 ENCOUNTER — Encounter: Payer: Self-pay | Admitting: Internal Medicine

## 2020-01-27 ENCOUNTER — Other Ambulatory Visit: Payer: Self-pay

## 2020-01-27 ENCOUNTER — Ambulatory Visit (INDEPENDENT_AMBULATORY_CARE_PROVIDER_SITE_OTHER): Payer: Medicare PPO | Admitting: Internal Medicine

## 2020-01-27 VITALS — BP 102/58 | HR 77 | Temp 98.5°F | Ht 65.0 in | Wt 145.0 lb

## 2020-01-27 DIAGNOSIS — M25511 Pain in right shoulder: Secondary | ICD-10-CM

## 2020-01-27 DIAGNOSIS — M25512 Pain in left shoulder: Secondary | ICD-10-CM

## 2020-01-27 DIAGNOSIS — F321 Major depressive disorder, single episode, moderate: Secondary | ICD-10-CM | POA: Diagnosis not present

## 2020-01-27 DIAGNOSIS — M791 Myalgia, unspecified site: Secondary | ICD-10-CM | POA: Diagnosis not present

## 2020-01-27 MED ORDER — PREDNISONE 20 MG PO TABS: 60.0000 mg | ORAL_TABLET | Freq: Every day | ORAL | 0 refills | Status: DC

## 2020-01-27 NOTE — Progress Notes (Signed)
Date:  01/27/2020   Name:  Jill Dunlap   DOB:  [birth year]   MRN:  [id number]   Chief Complaint: Shoulder Pain (both hips, both shoulders, all joints, weakness), Hip Pain (oral steriods last tuesday morning, hard to walk, stand, bend down ), and Fatigue (weakness)  Muscle pain and stiffness - pt was seen several weeks ago with mild symptoms.  PMR was suspected but Sed rate was 40 and she was then seen by Ortho and felt to have shoulder and hip pathology - tendonitis vs rotator cuff vs OA.  She received mobic, steroid injections in the shoulders and a steroid taper.  When she had a follow up two days ago she was doing very well.  She was able to drive herself to the office, able to reach over her head and get up from a chair or the bed without assistance.  At that time she was on the last day of prednisone. Yesterday she was much worse - very painful muscles and joints involving the arms and legs.  She called Ortho for more prednisone but they declined.  Today she can barely walk and needs assistance to get up from the chair.  She is very stiff which she says improved yesterday as the day progressed.  She denies fever, joint swelling, falls. She also stopped her low dose Zoloft 2 days ago and will be starting Lexapro.   Lab Results  Component Value Date   CREATININE 0.84 11/21/2019   BUN 15 11/21/2019   NA 140 11/21/2019   K 4.5 11/21/2019   CL 102 11/21/2019   CO2 23 11/21/2019   Lab Results  Component Value Date   CHOL 168 11/21/2019   HDL 69 11/21/2019   LDLCALC 81 11/21/2019   TRIG 103 11/21/2019   CHOLHDL 2.4 11/21/2019   Lab Results  Component Value Date   TSH 1.420 11/21/2019   Lab Results  Component Value Date   HGBA1C 6.2 (H) 11/21/2019   Lab Results  Component Value Date   WBC 5.6 11/21/2019   HGB 13.4 11/21/2019   HCT 41.2 11/21/2019   MCV 90 11/21/2019   PLT 180 11/21/2019   Lab Results  Component Value Date   ALT 14 11/21/2019   AST 17 11/21/2019    ALKPHOS 34 (L) 11/21/2019   BILITOT 0.4 11/21/2019     Review of Systems  Constitutional: Positive for fatigue. Negative for chills, diaphoresis and fever.  Respiratory: Negative for chest tightness and shortness of breath.   Cardiovascular: Negative for chest pain.  Musculoskeletal: Positive for arthralgias, gait problem and myalgias. Negative for joint swelling.  Neurological: Negative for dizziness and light-headedness.  Psychiatric/Behavioral: Positive for dysphoric mood. The patient is nervous/anxious.     Patient Active Problem List   Diagnosis Date Noted  . Primary osteoarthritis of left hip 01/24/2020  . Acute pain of both shoulders 01/24/2020  . Hip pain, chronic, unspecified laterality 12/28/2019  . Current moderate episode of major depressive disorder without prior episode ([place]) 12/28/2019  . Diffuse photodamage of skin 02/13/2016  . Basal cell carcinoma 01/11/2016  . Gastroesophageal reflux disease without esophagitis 07/03/2015  . Prediabetes 03/15/2015  . Sprain of rotator cuff capsule 01/11/2015  . Acquired hypothyroidism 10/31/2014  . Allergic rhinitis 10/31/2014  . Benign hypertension 10/31/2014  . CD (celiac disease) 10/31/2014  . Dyslipidemia 10/31/2014  . OP (osteoporosis) 10/31/2014    Allergies  Allergen Reactions  . Ciprofloxacin Hcl Rash    Past Surgical History:  Procedure Laterality Date  . ABDOMINAL HYSTERECTOMY  1984   for bleeding  . Basil cell    . BILATERAL SALPINGOOPHORECTOMY  1984  . CATARACT EXTRACTION W/PHACO Right 11/24/2016   Procedure: CATARACT EXTRACTION PHACO AND INTRAOCULAR LENS PLACEMENT ([place]) Right;  Surgeon: Leandrew Koyanagi, MD;  Location: [facility];  Service: Ophthalmology;  Laterality: Right;  . CATARACT EXTRACTION W/PHACO Left 02/04/2017   Procedure: CATARACT EXTRACTION PHACO AND INTRAOCULAR LENS PLACEMENT ([place])  Left;  Surgeon: Leandrew Koyanagi, MD;  Location: [place];  Service:  Ophthalmology;  Laterality: Left;  . ORIF WRIST FRACTURE Left 2015  . TYMPANOPLASTY  1992    Social History   Tobacco Use  . Smoking status: Never Smoker  . Smokeless tobacco: Never Used  . Tobacco comment: smoking cessation materials not required  Vaping Use  . Vaping Use: Never used  Substance Use Topics  . Alcohol use: No    Alcohol/week: 0.0 standard drinks  . Drug use: No     Medication list has been reviewed and updated.  Current Meds  Medication Sig  . azelastine (ASTELIN) 0.1 % nasal spray INSTILL 2 SPRAYS IN EACH NOSTRIL TWICE DAILY AS DIRECTED  . Biotin 2500 MCG CAPS Take 1 capsule by mouth daily.  . Cranberry 500 MG CAPS Take 1 capsule by mouth daily.   Marland Kitchen escitalopram (LEXAPRO) 10 MG tablet Take 1 tablet (10 mg total) by mouth daily.  . fexofenadine (ALLEGRA) 180 MG tablet Take 1 tablet by mouth daily.  . fluticasone (FLONASE) 50 MCG/ACT nasal spray PLACE 2 SPRAYS INTO BOTH NOSTRILS DAILY.  Marland Kitchen Glucosamine-Chondroitin (MOVE FREE PO) Take by mouth daily.  Marland Kitchen levothyroxine (SYNTHROID) 50 MCG tablet TAKE ONE TABLET BY MOUTH DAILY BEFORE BREAKFAST.  Marland Kitchen lisinopril-hydrochlorothiazide (PRINZIDE,ZESTORETIC) 20-12.5 MG tablet Take 1 tablet by mouth daily.  . Multiple Vitamins-Minerals (CENTRUM SILVER PO) Take by mouth daily.  Marland Kitchen omeprazole (PRILOSEC) 20 MG capsule Take 1 capsule (20 mg total) by mouth daily.  . simvastatin (ZOCOR) 20 MG tablet TAKE ONE TABLET BY MOUTH DAILY AT 6PM.  . TURMERIC PO Take 1 tablet by mouth daily.     PHQ 2/9 Scores 01/27/2020 01/27/2020 01/24/2020 12/28/2019  PHQ - 2 Score 2 2 0 2  PHQ- 9 Score 4 - 0 8    GAD 7 : Generalized Anxiety Score 01/27/2020 01/24/2020 12/28/2019 11/21/2019  Nervous, Anxious, on Edge 0 0 2 0  Control/stop worrying 0 0 3 0  Worry too much - different things 0 0 3 0  Trouble relaxing 3 1 3  0  Restless 0 0 3 0  Easily annoyed or irritable 0 0 0 0  Afraid - awful might happen 1 0 1 0  Total GAD 7 Score 4 1 15  0  Anxiety Difficulty  Not difficult at all Not difficult at all Not difficult at all Not difficult at all    BP Readings from Last 3 Encounters:  01/27/20 (!) 96/56  01/24/20 126/60  12/28/19 116/64    Physical Exam Constitutional:      General: She is in acute distress.  Cardiovascular:     Rate and Rhythm: Normal rate and regular rhythm.  Pulmonary:     Effort: Pulmonary effort is normal. No respiratory distress.     Breath sounds: No stridor.  Musculoskeletal:        General: Tenderness (over upper back, upper arms, thighs) present.     Right shoulder: Tenderness present. Decreased range of motion.     Left shoulder:  Tenderness present. Decreased range of motion.     Cervical back: Normal range of motion.     Right lower leg: No edema.  Lymphadenopathy:     Cervical: No cervical adenopathy.  Skin:    General: Skin is warm and dry.  Neurological:     Mental Status: She is alert.     Wt Readings from Last 3 Encounters:  01/27/20 145 lb (65.8 kg)  01/24/20 145 lb (65.8 kg)  12/28/19 150 lb (68 kg)    BP (!) 96/56   Pulse 77   Temp 98.5 F (36.9 C) (Oral)   Ht 5' 5"  (1.651 m)   Wt 145 lb (65.8 kg)   SpO2 97%   BMI 24.13 kg/m   Assessment and Plan: 1. Myalgia Suspect PMR so will start higher dose prednisone and taper as allowed Will get labs for PMR, DM and PM - MyoMarker 3 Plus Profile (RDL) - Sedimentation rate - C-reactive protein - predniSONE (DELTASONE) 20 MG tablet; Take 3 tablets (60 mg total) by mouth daily with breakfast.  Dispense: 90 tablet; Refill: 0 - Comprehensive metabolic panel  2. Acute pain of both shoulders Has MRI scheduled with Ortho Should probably proceed to rule out secondary process contributing to her UE sx  3. Current moderate episode of major depressive disorder without prior episode [facility]) Now off of Zoloft Start Lexapro in one week   Partially dictated using Editor, commissioning. Any errors are unintentional.  Halina Maidens, MD [facility]  01/27/2020

## 2020-01-27 NOTE — Telephone Encounter (Signed)
Pt was seen in office today. 01/27/2020  KP

## 2020-01-27 NOTE — Patient Instructions (Addendum)
PMR - suspected  Start with prednisone 60 mg per day for 2 weeks then decrease to 40 mg per day.

## 2020-01-30 ENCOUNTER — Encounter: Payer: Self-pay | Admitting: Internal Medicine

## 2020-01-31 ENCOUNTER — Encounter: Payer: Self-pay | Admitting: Internal Medicine

## 2020-02-01 ENCOUNTER — Ambulatory Visit: Payer: Medicare Other

## 2020-02-01 ENCOUNTER — Encounter: Payer: Self-pay | Admitting: Internal Medicine

## 2020-02-01 ENCOUNTER — Ambulatory Visit: Payer: Medicare PPO | Admitting: Internal Medicine

## 2020-02-01 NOTE — Telephone Encounter (Signed)
Please Advise.  KP

## 2020-02-02 ENCOUNTER — Encounter: Payer: Self-pay | Admitting: Internal Medicine

## 2020-02-02 DIAGNOSIS — M25819 Other specified joint disorders, unspecified shoulder: Secondary | ICD-10-CM | POA: Insufficient documentation

## 2020-02-02 DIAGNOSIS — M353 Polymyalgia rheumatica: Secondary | ICD-10-CM | POA: Insufficient documentation

## 2020-02-02 DIAGNOSIS — M719 Bursopathy, unspecified: Secondary | ICD-10-CM | POA: Insufficient documentation

## 2020-02-06 DIAGNOSIS — M25511 Pain in right shoulder: Secondary | ICD-10-CM | POA: Diagnosis not present

## 2020-02-06 DIAGNOSIS — M25512 Pain in left shoulder: Secondary | ICD-10-CM | POA: Diagnosis not present

## 2020-02-09 LAB — MYOMARKER 3 PLUS PROFILE (RDL)

## 2020-02-09 LAB — COMPREHENSIVE METABOLIC PANEL
ALT: 10 IU/L (ref 0–32)
AST: 8 IU/L (ref 0–40)
Albumin/Globulin Ratio: 1.3 (ref 1.2–2.2)
Albumin: 4 g/dL (ref 3.6–4.6)
Alkaline Phosphatase: 52 IU/L (ref 48–121)
BUN/Creatinine Ratio: 31 — ABNORMAL HIGH (ref 12–28)
BUN: 23 mg/dL (ref 8–27)
Bilirubin Total: 0.6 mg/dL (ref 0.0–1.2)
CO2: 23 mmol/L (ref 20–29)
Calcium: 9.6 mg/dL (ref 8.7–10.3)
Chloride: 92 mmol/L — ABNORMAL LOW (ref 96–106)
Creatinine, Ser: 0.74 mg/dL (ref 0.57–1.00)
GFR calc Af Amer: 87 mL/min/{1.73_m2} (ref >59)
GFR calc non Af Amer: 75 mL/min/{1.73_m2} (ref >59)
Globulin, Total: 3.1 g/dL (ref 1.5–4.5)
Glucose: 110 mg/dL — ABNORMAL HIGH (ref 65–99)
Potassium: 4.8 mmol/L (ref 3.5–5.2)
Sodium: 129 mmol/L — ABNORMAL LOW (ref 134–144)
Total Protein: 7.1 g/dL (ref 6.0–8.5)

## 2020-02-09 LAB — C-REACTIVE PROTEIN: CRP: 144 mg/L — ABNORMAL HIGH (ref 0–10)

## 2020-02-09 LAB — SEDIMENTATION RATE: Sed Rate: 29 mm/hr (ref 0–40)

## 2020-02-15 DIAGNOSIS — M7542 Impingement syndrome of left shoulder: Secondary | ICD-10-CM | POA: Diagnosis not present

## 2020-02-15 DIAGNOSIS — M7541 Impingement syndrome of right shoulder: Secondary | ICD-10-CM | POA: Diagnosis not present

## 2020-02-16 DIAGNOSIS — E119 Type 2 diabetes mellitus without complications: Secondary | ICD-10-CM | POA: Diagnosis not present

## 2020-02-27 ENCOUNTER — Ambulatory Visit (INDEPENDENT_AMBULATORY_CARE_PROVIDER_SITE_OTHER): Payer: Medicare PPO | Admitting: Internal Medicine

## 2020-02-27 ENCOUNTER — Encounter: Payer: Self-pay | Admitting: Internal Medicine

## 2020-02-27 ENCOUNTER — Other Ambulatory Visit: Payer: Self-pay

## 2020-02-27 ENCOUNTER — Other Ambulatory Visit
Admission: RE | Admit: 2020-02-27 | Discharge: 2020-02-27 | Disposition: A | Discharge status: Home / Self Care | Payer: Medicare PPO | Attending: Internal Medicine | Admitting: Internal Medicine

## 2020-02-27 VITALS — BP 112/54 | HR 75 | Temp 98.3°F | Ht 65.0 in | Wt 150.0 lb

## 2020-02-27 DIAGNOSIS — R7303 Prediabetes: Secondary | ICD-10-CM | POA: Insufficient documentation

## 2020-02-27 DIAGNOSIS — F321 Major depressive disorder, single episode, moderate: Secondary | ICD-10-CM | POA: Diagnosis not present

## 2020-02-27 DIAGNOSIS — M353 Polymyalgia rheumatica: Secondary | ICD-10-CM

## 2020-02-27 LAB — HEMOGLOBIN A1C
Hgb A1c MFr Bld: 6.8 % — ABNORMAL HIGH (ref 4.8–5.6)
Mean Plasma Glucose: 148.46 mg/dL

## 2020-02-27 LAB — SEDIMENTATION RATE: Sed Rate: 42 mm/hr — ABNORMAL HIGH (ref 0–30)

## 2020-02-27 NOTE — Progress Notes (Signed)
Date:  02/27/2020   Name:  Jill Dunlap   DOB:  Jun 17, 1935   MRN:  [id number]   Chief Complaint: Depression (follow up )  PMR - diagnosed last visit with PMR that has responded dramatically to prednisone 30 mg per day and was decreased to 20 mg about three weeks ago.  She denies sided effects to the medication.  She does have pre-diabetes with last A1C 6.2.  She has gained 5 lbs since her last encounter.  She has some morning stiffness that is mild and resolves 2 hours after prednisone.  She tried to decrease to 15 mg but after four days her symptoms were much worse and did not improve through the day.  Depression        This is a chronic problem.  The problem has been resolved since onset.  Associated symptoms include appetite change and myalgias (much improved).  Associated symptoms include no fatigue and no headaches.  Past treatments include SSRIs - Selective serotonin reuptake inhibitors.   Lab Results  Component Value Date   CREATININE 0.74 01/27/2020   BUN 23 01/27/2020   NA 129 (L) 01/27/2020   K 4.8 01/27/2020   CL 92 (L) 01/27/2020   CO2 23 01/27/2020   Lab Results  Component Value Date   CHOL 168 11/21/2019   HDL 69 11/21/2019   LDLCALC 81 11/21/2019   TRIG 103 11/21/2019   CHOLHDL 2.4 11/21/2019   Lab Results  Component Value Date   TSH 1.420 11/21/2019   Lab Results  Component Value Date   HGBA1C 6.2 (H) 11/21/2019   Lab Results  Component Value Date   WBC 5.6 11/21/2019   HGB 13.4 11/21/2019   HCT 41.2 11/21/2019   MCV 90 11/21/2019   PLT 180 11/21/2019   Lab Results  Component Value Date   ALT 10 01/27/2020   AST 8 01/27/2020   ALKPHOS 52 01/27/2020   BILITOT 0.6 01/27/2020     Review of Systems  Constitutional: Positive for appetite change. Negative for diaphoresis, fatigue and fever.  HENT: Positive for voice change (feels that her voice is slightly weaker). Negative for trouble swallowing.   Respiratory: Negative for chest  tightness and shortness of breath.   Cardiovascular: Negative for chest pain.  Gastrointestinal: Negative for abdominal pain, constipation and diarrhea.  Musculoskeletal: Positive for arthralgias and myalgias (much improved). Negative for gait problem.  Neurological: Negative for dizziness, light-headedness and headaches.  Psychiatric/Behavioral: Positive for depression.    Patient Active Problem List   Diagnosis Date Noted  . Disorder of bursae of shoulder region 02/02/2020  . PMR (polymyalgia rheumatica) (HCC) 02/02/2020  . Primary osteoarthritis of left hip 01/24/2020  . Acute pain of both shoulders 01/24/2020  . Hip pain, chronic, unspecified laterality 12/28/2019  . Current moderate episode of major depressive disorder without prior episode ([place]) 12/28/2019  . Diffuse photodamage of skin 02/13/2016  . Basal cell carcinoma 01/11/2016  . Gastroesophageal reflux disease without esophagitis 07/03/2015  . Prediabetes 03/15/2015  . Sprain of rotator cuff capsule 01/11/2015  . Acquired hypothyroidism 10/31/2014  . Allergic rhinitis 10/31/2014  . Benign hypertension 10/31/2014  . CD (celiac disease) 10/31/2014  . Dyslipidemia 10/31/2014  . OP (osteoporosis) 10/31/2014    Allergies  Allergen Reactions  . Ciprofloxacin Hcl Rash    Past Surgical History:  Procedure Laterality Date  . ABDOMINAL HYSTERECTOMY  1984   for bleeding  . Basil cell    . BILATERAL SALPINGOOPHORECTOMY  1984  .  CATARACT EXTRACTION W/PHACO Right 11/24/2016   Procedure: CATARACT EXTRACTION PHACO AND INTRAOCULAR LENS PLACEMENT ([place]) Right;  Surgeon: Leandrew Koyanagi, MD;  Location: [facility];  Service: Ophthalmology;  Laterality: Right;  . CATARACT EXTRACTION W/PHACO Left 02/04/2017   Procedure: CATARACT EXTRACTION PHACO AND INTRAOCULAR LENS PLACEMENT ([place])  Left;  Surgeon: Leandrew Koyanagi, MD;  Location: [facility];  Service: Ophthalmology;  Laterality: Left;  . ORIF WRIST FRACTURE  Left 2015  . TYMPANOPLASTY  1992    Social History   Tobacco Use  . Smoking status: Never Smoker  . Smokeless tobacco: Never Used  . Tobacco comment: smoking cessation materials not required  Vaping Use  . Vaping Use: Never used  Substance Use Topics  . Alcohol use: No    Alcohol/week: 0.0 standard drinks  . Drug use: No     Medication list has been reviewed and updated.  Current Meds  Medication Sig  . azelastine (ASTELIN) 0.1 % nasal spray INSTILL 2 SPRAYS IN EACH NOSTRIL TWICE DAILY AS DIRECTED  . Biotin 2500 MCG CAPS Take 1 capsule by mouth daily.  . Cranberry 500 MG CAPS Take 1 capsule by mouth daily.   Marland Kitchen escitalopram (LEXAPRO) 10 MG tablet Take 1 tablet (10 mg total) by mouth daily.  . fexofenadine (ALLEGRA) 180 MG tablet Take 1 tablet by mouth daily.  . fluticasone (FLONASE) 50 MCG/ACT nasal spray PLACE 2 SPRAYS INTO BOTH NOSTRILS DAILY.  Marland Kitchen Glucosamine-Chondroitin (MOVE FREE PO) Take by mouth daily.  Marland Kitchen levothyroxine (SYNTHROID) 50 MCG tablet TAKE ONE TABLET BY MOUTH DAILY BEFORE BREAKFAST.  Marland Kitchen lisinopril-hydrochlorothiazide (PRINZIDE,ZESTORETIC) 20-12.5 MG tablet Take 1 tablet by mouth daily.  . Multiple Vitamins-Minerals (CENTRUM SILVER PO) Take by mouth daily.  Marland Kitchen omeprazole (PRILOSEC) 20 MG capsule Take 1 capsule (20 mg total) by mouth daily.  . predniSONE (DELTASONE) 20 MG tablet Take 3 tablets (60 mg total) by mouth daily with breakfast.  . simvastatin (ZOCOR) 20 MG tablet TAKE ONE TABLET BY MOUTH DAILY AT 6PM.  . TURMERIC PO Take 1 tablet by mouth daily.     PHQ 2/9 Scores 02/27/2020 01/27/2020 01/27/2020 01/24/2020  PHQ - 2 Score 0 2 2 0  PHQ- 9 Score 0 4 - 0    GAD 7 : Generalized Anxiety Score 02/27/2020 01/27/2020 01/24/2020 12/28/2019  Nervous, Anxious, on Edge 0 0 0 2  Control/stop worrying 0 0 0 3  Worry too much - different things 0 0 0 3  Trouble relaxing 0 3 1 3   Restless 0 0 0 3  Easily annoyed or irritable 0 0 0 0  Afraid - awful might happen 0 1 0 1  Total  GAD 7 Score 0 4 1 15   Anxiety Difficulty Not difficult at all Not difficult at all Not difficult at all Not difficult at all    BP Readings from Last 3 Encounters:  02/27/20 (!) 112/54  01/27/20 (!) 102/58  01/24/20 126/60    Physical Exam Vitals and nursing note reviewed.  Constitutional:      General: She is not in acute distress.    Appearance: Normal appearance. She is well-developed.  HENT:     Head: Normocephalic and atraumatic.  Cardiovascular:     Rate and Rhythm: Normal rate and regular rhythm.  Pulmonary:     Effort: Pulmonary effort is normal. No respiratory distress.     Breath sounds: No wheezing or rhonchi.  Musculoskeletal:     Cervical back: Normal range of motion.  Right lower leg: No edema.     Left lower leg: No edema.  Lymphadenopathy:     Cervical: No cervical adenopathy.  Skin:    General: Skin is warm and dry.     Capillary Refill: Capillary refill takes less than 2 seconds.     Findings: No rash.  Neurological:     General: No focal deficit present.     Mental Status: She is alert and oriented to person, place, and time.     Motor: No weakness.     Gait: Gait normal.  Psychiatric:        Mood and Affect: Mood normal.     Wt Readings from Last 3 Encounters:  02/27/20 150 lb (68 kg)  01/27/20 145 lb (65.8 kg)  01/24/20 145 lb (65.8 kg)    BP (!) 112/54   Pulse 75   Temp 98.3 F (36.8 C) (Oral)   Ht 5' 5"  (1.651 m)   Wt 150 lb (68 kg)   SpO2 96%   BMI 24.96 kg/m   Assessment and Plan: 1. PMR (polymyalgia rheumatica) (HCC) Continue prednisone 20 mg per day - can split the dose if desired Virtual follow up in one month to consider beginning a slow taper - Sedimentation rate  2. Current moderate episode of major depressive disorder without prior episode ([place]) Clinically stable on current regimen with good control of symptoms, No SI or HI. Will continue current therapy.  3. Prediabetes Check labs more frequently since on daily  steroids - Hemoglobin A1c   Partially dictated using Editor, commissioning. Any errors are unintentional.  Halina Maidens, MD [facility]  02/27/2020

## 2020-02-28 ENCOUNTER — Other Ambulatory Visit: Payer: Self-pay | Admitting: Internal Medicine

## 2020-02-28 DIAGNOSIS — M353 Polymyalgia rheumatica: Secondary | ICD-10-CM

## 2020-02-28 MED ORDER — PREDNISONE 5 MG PO TABS: 5.0000 mg | ORAL_TABLET | ORAL | 5 refills | Status: DC

## 2020-03-13 ENCOUNTER — Encounter: Payer: Self-pay | Admitting: Internal Medicine

## 2020-03-20 ENCOUNTER — Other Ambulatory Visit: Payer: Self-pay | Admitting: Internal Medicine

## 2020-03-20 DIAGNOSIS — F321 Major depressive disorder, single episode, moderate: Secondary | ICD-10-CM

## 2020-03-28 ENCOUNTER — Ambulatory Visit (INDEPENDENT_AMBULATORY_CARE_PROVIDER_SITE_OTHER): Payer: Medicare PPO | Admitting: Internal Medicine

## 2020-03-28 ENCOUNTER — Encounter: Payer: Self-pay | Admitting: Internal Medicine

## 2020-03-28 ENCOUNTER — Telehealth: Payer: Self-pay

## 2020-03-28 ENCOUNTER — Other Ambulatory Visit: Payer: Self-pay

## 2020-03-28 VITALS — BP 115/54 | HR 61 | Ht 65.0 in | Wt 150.0 lb

## 2020-03-28 DIAGNOSIS — F321 Major depressive disorder, single episode, moderate: Secondary | ICD-10-CM

## 2020-03-28 DIAGNOSIS — M353 Polymyalgia rheumatica: Secondary | ICD-10-CM | POA: Diagnosis not present

## 2020-03-28 NOTE — Telephone Encounter (Signed)
This visit type is being conducted due to national recommendations for restrictions regarding the COVID- 19 Pandemic (e.g. social distancing) in effort to limit this patients exposure and mitigate transmission in our community. This visit type is felt to be most appropriate for this patient at this time.   I connected with the patient today and received telephone consent from the patient and patient understand this consent will be good for 1 year.  CM

## 2020-03-28 NOTE — Progress Notes (Signed)
Date:  03/28/2020   Name:  Jill Dunlap   DOB:  06-13-1935   MRN:  [id number]  I connected with this patient, Jill Dunlap, by telephone at the patient's home.  I verified that I am speaking with the correct person using two identifiers. This visit was conducted via telephone due to the Covid-19 outbreak from my office at [facility] in [place], [place].  Chief Complaint: PMR (Started on prednisone and doing much better than a month ago. )  Depression        The problem has been resolved since onset.  Associated symptoms include no fatigue, does not have insomnia, no myalgias, no headaches and no suicidal ideas.  Past treatments include SSRIs - Selective serotonin reuptake inhibitors.   PMR - diagnosed with PMR in July although sed rate was never very high.  She has responded well to daily prednisone therapy.  Currently on 25 mg per day with no relapses.  She denies side effects other than good appetite.  She is sleeping well since starting Lexapro.  She is very pleased with her response.  She is planning to get the Covid-19 vaccine booster this week.   Lab Results  Component Value Date   CREATININE 0.74 01/27/2020   BUN 23 01/27/2020   NA 129 (L) 01/27/2020   K 4.8 01/27/2020   CL 92 (L) 01/27/2020   CO2 23 01/27/2020   Lab Results  Component Value Date   CHOL 168 11/21/2019   HDL 69 11/21/2019   LDLCALC 81 11/21/2019   TRIG 103 11/21/2019   CHOLHDL 2.4 11/21/2019   Lab Results  Component Value Date   TSH 1.420 11/21/2019   Lab Results  Component Value Date   HGBA1C 6.8 (H) 02/27/2020   Lab Results  Component Value Date   WBC 5.6 11/21/2019   HGB 13.4 11/21/2019   HCT 41.2 11/21/2019   MCV 90 11/21/2019   PLT 180 11/21/2019   Lab Results  Component Value Date   ALT 10 01/27/2020   AST 8 01/27/2020   ALKPHOS 52 01/27/2020   BILITOT 0.6 01/27/2020   Lab Results  Component Value Date   ESRSEDRATE 42 (H) 02/27/2020     Review of Systems   Constitutional: Negative for chills, fatigue and fever.  Respiratory: Negative for cough, chest tightness, shortness of breath and wheezing.   Cardiovascular: Negative for chest pain and palpitations.  Gastrointestinal: Negative for abdominal pain and constipation.  Musculoskeletal: Negative for arthralgias, back pain, gait problem and myalgias.  Neurological: Negative for dizziness and headaches.  Psychiatric/Behavioral: Positive for depression. Negative for dysphoric mood, sleep disturbance and suicidal ideas. The patient is not nervous/anxious and does not have insomnia.     Patient Active Problem List   Diagnosis Date Noted  . Disorder of bursae of shoulder region 02/02/2020  . PMR (polymyalgia rheumatica) (HCC) 02/02/2020  . Primary osteoarthritis of left hip 01/24/2020  . Acute pain of both shoulders 01/24/2020  . Hip pain, chronic, unspecified laterality 12/28/2019  . Current moderate episode of major depressive disorder without prior episode ([place]) 12/28/2019  . Diffuse photodamage of skin 02/13/2016  . Basal cell carcinoma 01/11/2016  . Gastroesophageal reflux disease without esophagitis 07/03/2015  . Prediabetes 03/15/2015  . Sprain of rotator cuff capsule 01/11/2015  . Acquired hypothyroidism 10/31/2014  . Allergic rhinitis 10/31/2014  . Benign hypertension 10/31/2014  . CD (celiac disease) 10/31/2014  . Dyslipidemia 10/31/2014  . OP (osteoporosis) 10/31/2014    Allergies  Allergen Reactions  . Ciprofloxacin Hcl Rash    Past Surgical History:  Procedure Laterality Date  . ABDOMINAL HYSTERECTOMY  1984   for bleeding  . Basil cell    . BILATERAL SALPINGOOPHORECTOMY  1984  . CATARACT EXTRACTION W/PHACO Right 11/24/2016   Procedure: CATARACT EXTRACTION PHACO AND INTRAOCULAR LENS PLACEMENT ([place]) Right;  Surgeon: Leandrew Koyanagi, MD;  Location: [facility];  Service: Ophthalmology;  Laterality: Right;  . CATARACT EXTRACTION W/PHACO Left 02/04/2017    Procedure: CATARACT EXTRACTION PHACO AND INTRAOCULAR LENS PLACEMENT ([place])  Left;  Surgeon: Leandrew Koyanagi, MD;  Location: [facility];  Service: Ophthalmology;  Laterality: Left;  . ORIF WRIST FRACTURE Left 2015  . TYMPANOPLASTY  1992    Social History   Tobacco Use  . Smoking status: Never Smoker  . Smokeless tobacco: Never Used  . Tobacco comment: smoking cessation materials not required  Vaping Use  . Vaping Use: Never used  Substance Use Topics  . Alcohol use: No    Alcohol/week: 0.0 standard drinks  . Drug use: No     Medication list has been reviewed and updated.  Current Meds  Medication Sig  . azelastine (ASTELIN) 0.1 % nasal spray INSTILL 2 SPRAYS IN EACH NOSTRIL TWICE DAILY AS DIRECTED  . Biotin 2500 MCG CAPS Take 1 capsule by mouth daily.  . Cranberry 500 MG CAPS Take 1 capsule by mouth daily.   Marland Kitchen escitalopram (LEXAPRO) 10 MG tablet TAKE ONE (1) TABLET BY MOUTH ONCE DAILY  . fexofenadine (ALLEGRA) 180 MG tablet Take 1 tablet by mouth daily.  . fluticasone (FLONASE) 50 MCG/ACT nasal spray PLACE 2 SPRAYS INTO BOTH NOSTRILS DAILY.  Marland Kitchen Glucosamine-Chondroitin (MOVE FREE PO) Take by mouth daily.  Marland Kitchen levothyroxine (SYNTHROID) 50 MCG tablet TAKE ONE TABLET BY MOUTH DAILY BEFORE BREAKFAST.  Marland Kitchen lisinopril-hydrochlorothiazide (PRINZIDE,ZESTORETIC) 20-12.5 MG tablet Take 1 tablet by mouth daily.  . Multiple Vitamins-Minerals (CENTRUM SILVER PO) Take by mouth daily.  Marland Kitchen omeprazole (PRILOSEC) 20 MG capsule Take 1 capsule (20 mg total) by mouth daily.  . predniSONE (DELTASONE) 20 MG tablet Take 3 tablets (60 mg total) by mouth daily with breakfast.  . predniSONE (DELTASONE) 5 MG tablet Take 1 tablet (5 mg total) by mouth as directed.  . simvastatin (ZOCOR) 20 MG tablet TAKE ONE TABLET BY MOUTH DAILY AT 6PM.  . TURMERIC PO Take 1 tablet by mouth daily.     PHQ 2/9 Scores 03/28/2020 02/27/2020 01/27/2020 01/27/2020  PHQ - 2 Score 0 0 2 2  PHQ- 9 Score 0 0 4 -    GAD 7 :  Generalized Anxiety Score 02/27/2020 01/27/2020 01/24/2020 12/28/2019  Nervous, Anxious, on Edge 0 0 0 2  Control/stop worrying 0 0 0 3  Worry too much - different things 0 0 0 3  Trouble relaxing 0 3 1 3   Restless 0 0 0 3  Easily annoyed or irritable 0 0 0 0  Afraid - awful might happen 0 1 0 1  Total GAD 7 Score 0 4 1 15   Anxiety Difficulty Not difficult at all Not difficult at all Not difficult at all Not difficult at all    BP Readings from Last 3 Encounters:  03/28/20 (!) 115/54  02/27/20 (!) 112/54  01/27/20 (!) 102/58    Physical Exam Pulmonary:     Comments: No cough or dyspnea noted  Neurological:     Mental Status: She is alert.  Psychiatric:        Attention and Perception:  Attention normal.        Mood and Affect: Mood normal.        Speech: Speech normal.     Wt Readings from Last 3 Encounters:  03/28/20 150 lb (68 kg)  02/27/20 150 lb (68 kg)  01/27/20 145 lb (65.8 kg)    BP (!) 115/54   Pulse 61   Ht 5' 5"  (1.651 m)   Wt 150 lb (68 kg)   BMI 24.96 kg/m   Assessment and Plan: 1. PMR (polymyalgia rheumatica) (HCC) Decrease prednisone to 22.5 mg for 2 weeks then 20 mg until next visit in October. Call if unable to reduce dose to 20 mg  2. Current moderate episode of major depressive disorder without prior episode ([place]) Clinically stable on current regimen with good control of symptoms, No SI or HI. Sleeping well. Will continue current therapy.    I spent 10 minutes on this encounter.  Partially dictated using Editor, commissioning. Any errors are unintentional.  Halina Maidens, MD [facility]  03/28/2020

## 2020-03-29 ENCOUNTER — Ambulatory Visit: Payer: Medicare PPO | Admitting: Internal Medicine

## 2020-04-04 DIAGNOSIS — E782 Mixed hyperlipidemia: Secondary | ICD-10-CM | POA: Diagnosis not present

## 2020-04-04 DIAGNOSIS — I10 Essential (primary) hypertension: Secondary | ICD-10-CM | POA: Diagnosis not present

## 2020-04-04 DIAGNOSIS — Z23 Encounter for immunization: Secondary | ICD-10-CM | POA: Diagnosis not present

## 2020-04-09 ENCOUNTER — Other Ambulatory Visit: Payer: Self-pay | Admitting: Internal Medicine

## 2020-04-09 DIAGNOSIS — M791 Myalgia, unspecified site: Secondary | ICD-10-CM

## 2020-04-11 ENCOUNTER — Ambulatory Visit (INDEPENDENT_AMBULATORY_CARE_PROVIDER_SITE_OTHER): Payer: Medicare PPO

## 2020-04-11 DIAGNOSIS — Z0000 Encounter for general adult medical examination without abnormal findings: Secondary | ICD-10-CM | POA: Diagnosis not present

## 2020-04-11 NOTE — Progress Notes (Signed)
Subjective:   Jill Dunlap is a 85 y.o. female who presents for Medicare Annual (Subsequent) preventive examination.  Virtual Visit via Telephone Note  I connected with  Jill Dunlap on 04/11/20 at 11:20 AM EDT by telephone and verified that I am speaking with the correct person using two identifiers.  Medicare Annual Wellness visit completed telephonically due to Covid-19 pandemic.   Location: Patient: home Provider: [facility]   I discussed the limitations, risks, security and privacy concerns of performing an evaluation and management service by telephone and the availability of in person appointments. The patient expressed understanding and agreed to proceed.  Unable to perform video visit due to video visit attempted and failed and/or patient does not have video capability.   Some vital signs may be absent or patient reported.   Clemetine Marker, LPN    Review of Systems     Cardiac Risk Factors include: advanced age (>75mn, >>51women);hypertension;dyslipidemia     Objective:    There were no vitals filed for this visit. There is no height or weight on file to calculate BMI.  Advanced Directives 04/11/2020 01/26/2019 04/20/2018 11/02/2017 02/04/2017 11/24/2016 10/29/2016  Does Patient Have a Medical Advance Directive? Yes Yes Yes Yes Yes Yes Yes  Type of AParamedicof AMoodusLiving will HBay CityLiving will HLaw[place]Living will HDoolittleLiving will HLake HamiltonLiving will HReevesLiving will Living will  Does patient want to make changes to medical advance directive? - - - Yes (MAU/Ambulatory/Procedural Areas - Information given) - No - Patient declined -  Copy of HThompsonsin Chart? No - copy requested No - copy requested - No - copy requested - No - copy requested -    Current Medications (verified) Outpatient Encounter  Medications as of 04/11/2020  Medication Sig  . azelastine (ASTELIN) 0.1 % nasal spray INSTILL 2 SPRAYS IN EACH NOSTRIL TWICE DAILY AS DIRECTED  . Biotin 2500 MCG CAPS Take 1 capsule by mouth daily.  . Cranberry 500 MG CAPS Take 1 capsule by mouth daily.   .Marland Kitchenescitalopram (LEXAPRO) 10 MG tablet TAKE ONE (1) TABLET BY MOUTH ONCE DAILY  . fexofenadine (ALLEGRA) 180 MG tablet Take 1 tablet by mouth daily.  . fluticasone (FLONASE) 50 MCG/ACT nasal spray PLACE 2 SPRAYS INTO BOTH NOSTRILS DAILY.  .Marland KitchenGlucosamine-Chondroitin (MOVE FREE PO) Take by mouth daily.  .Marland Kitchenlevothyroxine (SYNTHROID) 50 MCG tablet TAKE ONE TABLET BY MOUTH DAILY BEFORE BREAKFAST.  .Marland Kitchenlisinopril-hydrochlorothiazide (PRINZIDE,ZESTORETIC) 20-12.5 MG tablet Take 1 tablet by mouth daily.  . Multiple Vitamins-Minerals (CENTRUM SILVER PO) Take by mouth daily.  .Marland Kitchenomeprazole (PRILOSEC) 20 MG capsule Take 1 capsule (20 mg total) by mouth daily.  . predniSONE (DELTASONE) 20 MG tablet Take 20 mg by mouth daily.  . simvastatin (ZOCOR) 20 MG tablet TAKE ONE TABLET BY MOUTH DAILY AT 6PM.  . TURMERIC PO Take 1 tablet by mouth daily.   . [DISCONTINUED] Omeprazole (PRILOSEC PO) Take 1 tablet by mouth.  . [DISCONTINUED] predniSONE (DELTASONE) 20 MG tablet TAKE (3) TABLETS BY MOUTH EVERY MORNING AS DIRECTED WITH BREAKFAST  . [DISCONTINUED] predniSONE (DELTASONE) 5 MG tablet Take 1 tablet (5 mg total) by mouth as directed.   No facility-administered encounter medications on file as of 04/11/2020.    Allergies (verified) Ciprofloxacin hcl   History: Past Medical History:  Diagnosis Date  . Acid reflux   . Allergy   . Cancer ([place]  skin ca on leg head and back  . Celiac disease   . Current moderate episode of major depressive disorder without prior episode ([place])   . Gastroesophageal reflux disease without esophagitis   . Hyperlipidemia   . Hypertension   . Hypothyroidism   . OP (osteoporosis)   . Pre-diabetes   . Primary osteoarthritis of  left hip   . Vertigo    no episodes in several years  . Wears dentures    partial lower   Past Surgical History:  Procedure Laterality Date  . ABDOMINAL HYSTERECTOMY  1984   for bleeding  . Basil cell    . BILATERAL SALPINGOOPHORECTOMY  1984  . CATARACT EXTRACTION W/PHACO Right 11/24/2016   Procedure: CATARACT EXTRACTION PHACO AND INTRAOCULAR LENS PLACEMENT ([place]) Right;  Surgeon: Leandrew Koyanagi, MD;  Location: [place];  Service: Ophthalmology;  Laterality: Right;  . CATARACT EXTRACTION W/PHACO Left 02/04/2017   Procedure: CATARACT EXTRACTION PHACO AND INTRAOCULAR LENS PLACEMENT ([place])  Left;  Surgeon: Leandrew Koyanagi, MD;  Location: [place];  Service: Ophthalmology;  Laterality: Left;  . ORIF WRIST FRACTURE Left 2015  . TYMPANOPLASTY  1992   Family History  Problem Relation Age of Onset  . Cancer Mother        oral  . Hypertension Father   . Heart disease Father   . Breast cancer Neg Hx    Social History   Socioeconomic History  . Marital status: Widowed    Spouse name: Audiological scientist  . Number of children: 2  . Years of education: Not on file  . Highest education level: Bachelor's degree (e.g., BA, AB, BS)  Occupational History  . Occupation: Retired  Tobacco Use  . Smoking status: Never Smoker  . Smokeless tobacco: Never Used  . Tobacco comment: smoking cessation materials not required  Vaping Use  . Vaping Use: Never used  Substance and Sexual Activity  . Alcohol use: No    Alcohol/week: 0.0 standard drinks  . Drug use: No  . Sexual activity: Not Currently  Other Topics Concern  . Not on file  Social History Narrative  . Not on file   Social Determinants of Health   Financial Resource Strain: Low Risk   . Difficulty of Paying Living Expenses: Not hard at all  Food Insecurity: No Food Insecurity  . Worried About Charity fundraiser in the Last Year: Never true  . Ran Out of Food in the Last Year: Never true  Transportation Needs: No  Transportation Needs  . Lack of Transportation (Medical): No  . Lack of Transportation (Non-Medical): No  Physical Activity: Inactive  . Days of Exercise per Week: 0 days  . Minutes of Exercise per Session: 0 min  Stress: No Stress Concern Present  . Feeling of Stress : Not at all  Social Connections: Moderately Integrated  . Frequency of Communication with Friends and Family: More than three times a week  . Frequency of Social Gatherings with Friends and Family: Three times a week  . Attends Religious Services: More than 4 times per year  . Active Member of Clubs or Organizations: No  . Attends Archivist Meetings: Never  . Marital Status: Married    Tobacco Counseling Counseling given: Not Answered Comment: smoking cessation materials not required   Clinical Intake:  Pre-visit preparation completed: Yes  Pain : No/denies pain     Nutritional Risks: None Diabetes: No  How often do you need to have someone help you when  you read instructions, pamphlets, or other written materials from your doctor or pharmacy?: 1 - Never    Interpreter Needed?: No  Information entered by :: Clemetine Marker LPN   Activities of Daily Living In your present state of health, do you have any difficulty performing the following activities: 04/11/2020 01/27/2020  Hearing? N Y  Comment declines hearing aids hard of hearing  Vision? N N  Difficulty concentrating or making decisions? N N  Walking or climbing stairs? N Y  Dressing or bathing? N Y  Comment - needs help dressing  Doing errands, shopping? N Y  Conservation officer, nature and eating ? N -  Using the Toilet? N -  In the past six months, have you accidently leaked urine? N -  Do you have problems with loss of bowel control? N -  Managing your Medications? N -  Managing your Finances? N -  Housekeeping or managing your Housekeeping? N -  Some recent data might be hidden    Patient Care Team: Glean Hess, MD as PCP - General  (Internal Medicine) Lucilla Lame, MD as Consulting Physician (Gastroenterology) Corey Skains, MD as Consulting Physician (Cardiology)  Indicate any recent Medical Services you may have received from other than [facility] providers in the past year (date may be approximate).     Assessment:   This is a routine wellness examination for Brittiany.  Hearing/Vision screen  Hearing Screening   125Hz  250Hz  500Hz  1000Hz  2000Hz  3000Hz  4000Hz  6000Hz  8000Hz   Right ear:           Left ear:           Comments: Hearing aids maintained by [employer] in [place] Screening Comments: Annual vision screenings done at [facility] Dr. Wallace Going  Dietary issues and exercise activities discussed: Current Exercise Habits: The patient does not participate in regular exercise at present, Exercise limited by: neurologic condition(s)  Goals    . DIET - INCREASE WATER INTAKE     Recommend to drink at least 6-8 8oz glasses of water per day.      Depression Screen PHQ 2/9 Scores 04/11/2020 03/28/2020 02/27/2020 01/27/2020 01/27/2020 01/24/2020 12/28/2019  PHQ - 2 Score 0 0 0 2 2 0 2  PHQ- 9 Score - 0 0 4 - 0 8    Fall Risk Fall Risk  04/11/2020 03/28/2020 02/27/2020 01/27/2020 01/24/2020  Falls in the past year? 0 0 0 0 0  Number falls in past yr: 0 0 - - -  Injury with Fall? 0 0 - - -  Risk for fall due to : No Fall Risks No Fall Risks No Fall Risks No Fall Risks -  Risk for fall due to: Comment - - - - -  Follow up Falls prevention discussed Falls evaluation completed Falls evaluation completed Falls evaluation completed Falls evaluation completed    Any stairs in or around the home? Yes  If so, are there any without handrails? No    Home free of loose throw rugs in walkways, pet beds, electrical cords, etc? Yes  Adequate lighting in your home to reduce risk of falls? Yes   ASSISTIVE DEVICES UTILIZED TO PREVENT FALLS:  Life alert? No  Use of a cane, walker or w/c? No  Grab bars in the bathroom? Yes  Shower  chair or bench in shower? No Elevated toilet seat or a handicapped toilet? No   TIMED UP AND GO:  Was the test performed? No . Telephonic visit.   Cognitive  Function:     6CIT Screen 01/26/2019 11/02/2017 10/29/2016  What Year? 0 points 0 points 0 points  What month? 0 points 0 points 0 points  What time? 0 points 0 points 0 points  Count back from 20 0 points 0 points 0 points  Months in reverse 0 points 0 points 0 points  Repeat phrase 0 points 2 points 0 points  Total Score 0 2 0    Immunizations Immunization History  Administered Date(s) Administered  . Influenza, High Dose Seasonal PF 04/27/2017, 04/28/2019  . Influenza-Unspecified 04/27/2015, 04/30/2018  . PFIZER SARS-COV-2 Vaccination 07/27/2019, 08/17/2019  . Pneumococcal Conjugate-13 06/29/2014  . Pneumococcal Polysaccharide-23 10/30/2008  . Td 10/30/2008  . Zoster 10/30/2008  . Zoster Recombinat (Shingrix) 04/07/2018, 06/09/2018    TDAP status: Due, Education has been provided regarding the importance of this vaccine. Advised may receive this vaccine at local pharmacy or Health Dept. Aware to provide a copy of the vaccination record if obtained from local pharmacy or Health Dept. Verbalized acceptance and understanding.   Flu Vaccine status: due for 2021-2022 season  Pneumococcal vaccine status: Up to date   Covid-19 vaccine status: Completed vaccines  Qualifies for Shingles Vaccine? Yes   Zostavax completed Yes   Shingrix Completed?: Yes  Screening Tests Health Maintenance  Topic Date Due  . INFLUENZA VACCINE  10/18/2020 (Originally 02/19/2020)  . TETANUS/TDAP  11/20/2020 (Originally 10/31/2018)  . MAMMOGRAM  12/19/2020  . DEXA SCAN  Completed  . COVID-19 Vaccine  Completed  . PNA vac Low Risk Adult  Completed    Health Maintenance  There are no preventive care reminders to display for this patient.  Colorectal cancer screening: No longer required.    Mammogram status: Completed 12/20/19. Repeat every  year   Bone Density status: Completed 11/12/16. Results reflect: Bone density results: OSTEOPENIA. Repeat every 2 years. Pt to discuss repeat screening recommendations with Dr. Army Melia  Lung Cancer Screening: (Low Dose CT Chest recommended if Age 26-80 years, 30 pack-year currently smoking OR have quit w/in 15years.) does not qualify.   Additional Screening:  Hepatitis C Screening: no longer required   Vision Screening: Recommended annual ophthalmology exams for early detection of glaucoma and other disorders of the eye. Is the patient up to date with their annual eye exam?  Yes  Who is the provider or what is the name of the office in which the patient attends annual eye exams? [place] Screening: Recommended annual dental exams for proper oral hygiene  Community Resource Referral / Chronic Care Management: CRR required this visit?  No   CCM required this visit?  No      Plan:     I have personally reviewed and noted the following in the patient's chart:   . Medical and social history . Use of alcohol, tobacco or illicit drugs  . Current medications and supplements . Functional ability and status . Nutritional status . Physical activity . Advanced directives . List of other physicians . Hospitalizations, surgeries, and ER visits in previous 12 months . Vitals . Screenings to include cognitive, depression, and falls . Referrals and appointments  In addition, I have reviewed and discussed with patient certain preventive protocols, quality metrics, and best practice recommendations. A written personalized care plan for preventive services as well as general preventive health recommendations were provided to patient.     Clemetine Marker, LPN   07/26/2692   Nurse Notes: none

## 2020-04-11 NOTE — Patient Instructions (Signed)
Jill Dunlap , Thank you for taking time to come for your Medicare Wellness Visit. I appreciate your ongoing commitment to your health goals. Please review the following plan we discussed and let me know if I can assist you in the future.   Screening recommendations/referrals: Colonoscopy: no longer required Mammogram: done 12/20/19 Bone Density: done 11/12/16 Recommended yearly ophthalmology/optometry visit for glaucoma screening and checkup Recommended yearly dental visit for hygiene and checkup  Vaccinations: Influenza vaccine: due Pneumococcal vaccine: done 06/29/14 Tdap vaccine: due Shingles vaccine: done 04/07/18 & 06/09/18   Covid-19:done 07/27/19, 08/17/19 & 03/30/20  Advanced directives: Please bring a copy of your health care power of attorney and living will to the office at your convenience.  Conditions/risks identified: Recommend increasing physical activity as tolerated  Next appointment: Follow up in one year for your annual wellness visit    Preventive Care 65 Years and Older, Female Preventive care refers to lifestyle choices and visits with your health care provider that can promote health and wellness. What does preventive care include?  A yearly physical exam. This is also called an annual well check.  Dental exams once or twice a year.  Routine eye exams. Ask your health care provider how often you should have your eyes checked.  Personal lifestyle choices, including:  Daily care of your teeth and gums.  Regular physical activity.  Eating a healthy diet.  Avoiding tobacco and drug use.  Limiting alcohol use.  Practicing safe sex.  Taking low-dose aspirin every day.  Taking vitamin and mineral supplements as recommended by your health care provider. What happens during an annual well check? The services and screenings done by your health care provider during your annual well check will depend on your age, overall health, lifestyle risk factors, and  family history of disease. Counseling  Your health care provider may ask you questions about your:  Alcohol use.  Tobacco use.  Drug use.  Emotional well-being.  Home and relationship well-being.  Sexual activity.  Eating habits.  History of falls.  Memory and ability to understand (cognition).  Work and work Statistician.  Reproductive health. Screening  You may have the following tests or measurements:  Height, weight, and BMI.  Blood pressure.  Lipid and cholesterol levels. These may be checked every 5 years, or more frequently if you are over 40 years old.  Skin check.  Lung cancer screening. You may have this screening every year starting at age 59 if you have a 30-pack-year history of smoking and currently smoke or have quit within the past 15 years.  Fecal occult blood test (FOBT) of the stool. You may have this test every year starting at age 54.  Flexible sigmoidoscopy or colonoscopy. You may have a sigmoidoscopy every 5 years or a colonoscopy every 10 years starting at age 64.  Hepatitis C blood test.  Hepatitis B blood test.  Sexually transmitted disease (STD) testing.  Diabetes screening. This is done by checking your blood sugar (glucose) after you have not eaten for a while (fasting). You may have this done every 1-3 years.  Bone density scan. This is done to screen for osteoporosis. You may have this done starting at age 25.  Mammogram. This may be done every 1-2 years. Talk to your health care provider about how often you should have regular mammograms. Talk with your health care provider about your test results, treatment options, and if necessary, the need for more tests. Vaccines  Your health care provider may recommend  certain vaccines, such as:  Influenza vaccine. This is recommended every year.  Tetanus, diphtheria, and acellular pertussis (Tdap, Td) vaccine. You may need a Td booster every 10 years.  Zoster vaccine. You may need this  after age 8.  Pneumococcal 13-valent conjugate (PCV13) vaccine. One dose is recommended after age 29.  Pneumococcal polysaccharide (PPSV23) vaccine. One dose is recommended after age 27. Talk to your health care provider about which screenings and vaccines you need and how often you need them. This information is not intended to replace advice given to you by your health care provider. Make sure you discuss any questions you have with your health care provider. Document Released: 08/03/2015 Document Revised: 03/26/2016 Document Reviewed: 05/08/2015 Elsevier Interactive Patient Education  2017 [employer] Prevention in the Home Falls can cause injuries. They can happen to people of all ages. There are many things you can do to make your home safe and to help prevent falls. What can I do on the outside of my home?  Regularly fix the edges of walkways and driveways and fix any cracks.  Remove anything that might make you trip as you walk through a door, such as a raised step or threshold.  Trim any bushes or trees on the path to your home.  Use bright outdoor lighting.  Clear any walking paths of anything that might make someone trip, such as rocks or tools.  Regularly check to see if handrails are loose or broken. Make sure that both sides of any steps have handrails.  Any raised decks and porches should have guardrails on the edges.  Have any leaves, snow, or ice cleared regularly.  Use sand or salt on walking paths during winter.  Clean up any spills in your garage right away. This includes oil or grease spills. What can I do in the bathroom?  Use night lights.  Install grab bars by the toilet and in the tub and shower. Do not use towel bars as grab bars.  Use non-skid mats or decals in the tub or shower.  If you need to sit down in the shower, use a plastic, non-slip stool.  Keep the floor dry. Clean up any water that spills on the floor as soon as it  happens.  Remove soap buildup in the tub or shower regularly.  Attach bath mats securely with double-sided non-slip rug tape.  Do not have throw rugs and other things on the floor that can make you trip. What can I do in the bedroom?  Use night lights.  Make sure that you have a light by your bed that is easy to reach.  Do not use any sheets or blankets that are too big for your bed. They should not hang down onto the floor.  Have a firm chair that has side arms. You can use this for support while you get dressed.  Do not have throw rugs and other things on the floor that can make you trip. What can I do in the kitchen?  Clean up any spills right away.  Avoid walking on wet floors.  Keep items that you use a lot in easy-to-reach places.  If you need to reach something above you, use a strong step stool that has a grab bar.  Keep electrical cords out of the way.  Do not use floor polish or wax that makes floors slippery. If you must use wax, use non-skid floor wax.  Do not have throw rugs and other  things on the floor that can make you trip. What can I do with my stairs?  Do not leave any items on the stairs.  Make sure that there are handrails on both sides of the stairs and use them. Fix handrails that are broken or loose. Make sure that handrails are as long as the stairways.  Check any carpeting to make sure that it is firmly attached to the stairs. Fix any carpet that is loose or worn.  Avoid having throw rugs at the top or bottom of the stairs. If you do have throw rugs, attach them to the floor with carpet tape.  Make sure that you have a light switch at the top of the stairs and the bottom of the stairs. If you do not have them, ask someone to add them for you. What else can I do to help prevent falls?  Wear shoes that:  Do not have high heels.  Have rubber bottoms.  Are comfortable and fit you well.  Are closed at the toe. Do not wear sandals.  If you  use a stepladder:  Make sure that it is fully opened. Do not climb a closed stepladder.  Make sure that both sides of the stepladder are locked into place.  Ask someone to hold it for you, if possible.  Clearly mark and make sure that you can see:  Any grab bars or handrails.  First and last steps.  Where the edge of each step is.  Use tools that help you move around (mobility aids) if they are needed. These include:  Canes.  Walkers.  Scooters.  Crutches.  Turn on the lights when you go into a dark area. Replace any light bulbs as soon as they burn out.  Set up your furniture so you have a clear path. Avoid moving your furniture around.  If any of your floors are uneven, fix them.  If there are any pets around you, be aware of where they are.  Review your medicines with your doctor. Some medicines can make you feel dizzy. This can increase your chance of falling. Ask your doctor what other things that you can do to help prevent falls. This information is not intended to replace advice given to you by your health care provider. Make sure you discuss any questions you have with your health care provider. Document Released: 05/03/2009 Document Revised: 12/13/2015 Document Reviewed: 08/11/2014 Elsevier Interactive Patient Education  2017 [employer].

## 2020-05-03 DIAGNOSIS — H903 Sensorineural hearing loss, bilateral: Secondary | ICD-10-CM | POA: Diagnosis not present

## 2020-05-03 DIAGNOSIS — H7402 Tympanosclerosis, left ear: Secondary | ICD-10-CM | POA: Diagnosis not present

## 2020-05-04 ENCOUNTER — Telehealth: Payer: Self-pay

## 2020-05-04 NOTE — Telephone Encounter (Signed)
We didn't call her, she has an appt next week.

## 2020-05-04 NOTE — Telephone Encounter (Unsigned)
Copied from [place] [phone]. Topic: General - Inquiry >> May 04, 2020 11:45 AM Alease Frame wrote: Reason for CRM:pt is returning call from office . No message or VM was left . Please advise

## 2020-05-09 ENCOUNTER — Other Ambulatory Visit
Admission: RE | Admit: 2020-05-09 | Discharge: 2020-05-09 | Disposition: A | Discharge status: Home / Self Care | Payer: Medicare PPO | Attending: Internal Medicine | Admitting: Internal Medicine

## 2020-05-09 ENCOUNTER — Encounter: Payer: Self-pay | Admitting: Internal Medicine

## 2020-05-09 ENCOUNTER — Other Ambulatory Visit: Payer: Self-pay

## 2020-05-09 ENCOUNTER — Ambulatory Visit (INDEPENDENT_AMBULATORY_CARE_PROVIDER_SITE_OTHER): Payer: Medicare PPO | Admitting: Internal Medicine

## 2020-05-09 VITALS — BP 134/72 | HR 56 | Temp 98.2°F | Ht 65.0 in | Wt 162.0 lb

## 2020-05-09 DIAGNOSIS — M353 Polymyalgia rheumatica: Secondary | ICD-10-CM

## 2020-05-09 DIAGNOSIS — I10 Essential (primary) hypertension: Secondary | ICD-10-CM | POA: Diagnosis not present

## 2020-05-09 DIAGNOSIS — S86911A Strain of unspecified muscle(s) and tendon(s) at lower leg level, right leg, initial encounter: Secondary | ICD-10-CM

## 2020-05-09 DIAGNOSIS — R7303 Prediabetes: Secondary | ICD-10-CM | POA: Diagnosis not present

## 2020-05-09 LAB — BASIC METABOLIC PANEL
Anion gap: 10 (ref 5–15)
BUN: 24 mg/dL — ABNORMAL HIGH (ref 8–23)
CO2: 29 mmol/L (ref 22–32)
Calcium: 9 mg/dL (ref 8.9–10.3)
Chloride: 94 mmol/L — ABNORMAL LOW (ref 98–111)
Creatinine, Ser: 1 mg/dL (ref 0.44–1.00)
GFR, Estimated: 52 mL/min — ABNORMAL LOW (ref >60)
Glucose, Bld: 106 mg/dL — ABNORMAL HIGH (ref 70–99)
Potassium: 3.8 mmol/L (ref 3.5–5.1)
Sodium: 133 mmol/L — ABNORMAL LOW (ref 135–145)

## 2020-05-09 LAB — SEDIMENTATION RATE: Sed Rate: 8 mm/hr (ref 0–30)

## 2020-05-09 MED ORDER — PREDNISONE 5 MG PO TABS: 20.0000 mg | ORAL_TABLET | Freq: Every day | ORAL | 2 refills | Status: DC

## 2020-05-09 NOTE — Patient Instructions (Signed)
Reduce the prednisone by going to 20 mg alternating with 15 mg daily for 2 weeks.  If no new symptoms, then reduce further to 15 mg every day until the next visit.

## 2020-05-09 NOTE — Progress Notes (Signed)
Date:  05/09/2020   Name:  Jill Dunlap   DOB:  1935/03/19   MRN:  [id number]   Chief Complaint: Hypertension (Follow up.), Prediabetes (Follow up.), and PMR (Follow up.)  Hypertension This is a chronic problem. The problem is controlled. Pertinent negatives include no chest pain, headaches, palpitations or shortness of breath. Past treatments include ACE inhibitors and diuretics. The current treatment provides significant improvement.  Diabetes She presents for her follow-up diabetic visit. Diabetes type: prediabetes. Her disease course has been stable. Pertinent negatives for hypoglycemia include no dizziness, headaches or tremors. Pertinent negatives for diabetes include no chest pain, no fatigue, no polydipsia and no polyuria.  Knee Pain  The incident occurred more than 1 week ago. The incident occurred in the yard. The injury mechanism was a twisting injury. The pain is present in the right knee. The quality of the pain is described as aching (posteriorly). The pain is mild. The pain has been fluctuating since onset. Pertinent negatives include no loss of sensation, muscle weakness or numbness. The symptoms are aggravated by weight bearing (esp extension).  PMR - onset in June 2021.  On prednisone 20 mg daily and doing well.  The last dose reduction was one month ago.  She is a bit disturbed by her weight gain of 12 lbs.   Lab Results  Component Value Date   CREATININE 0.74 01/27/2020   BUN 23 01/27/2020   NA 129 (L) 01/27/2020   K 4.8 01/27/2020   CL 92 (L) 01/27/2020   CO2 23 01/27/2020   Lab Results  Component Value Date   CHOL 168 11/21/2019   HDL 69 11/21/2019   LDLCALC 81 11/21/2019   TRIG 103 11/21/2019   CHOLHDL 2.4 11/21/2019   Lab Results  Component Value Date   TSH 1.420 11/21/2019   Lab Results  Component Value Date   HGBA1C 6.8 (H) 02/27/2020   Lab Results  Component Value Date   WBC 5.6 11/21/2019   HGB 13.4 11/21/2019   HCT 41.2 11/21/2019    MCV 90 11/21/2019   PLT 180 11/21/2019   Lab Results  Component Value Date   ALT 10 01/27/2020   AST 8 01/27/2020   ALKPHOS 52 01/27/2020   BILITOT 0.6 01/27/2020     Review of Systems  Constitutional: Positive for unexpected weight change. Negative for appetite change, fatigue and fever.  HENT: Negative for tinnitus and trouble swallowing.   Eyes: Negative for visual disturbance.  Respiratory: Negative for cough, chest tightness and shortness of breath.   Cardiovascular: Negative for chest pain, palpitations and leg swelling.  Gastrointestinal: Negative for abdominal pain.  Endocrine: Negative for polydipsia and polyuria.  Genitourinary: Negative for dysuria and hematuria.  Musculoskeletal: Positive for arthralgias (twisted her right knee in the yard 2 weeks ago).  Neurological: Negative for dizziness, tremors, numbness and headaches.  Psychiatric/Behavioral: Negative for dysphoric mood.    Patient Active Problem List   Diagnosis Date Noted  . Disorder of bursae of shoulder region 02/02/2020  . PMR (polymyalgia rheumatica) (HCC) 02/02/2020  . Primary osteoarthritis of left hip 01/24/2020  . Acute pain of both shoulders 01/24/2020  . Hip pain, chronic, unspecified laterality 12/28/2019  . Current moderate episode of major depressive disorder without prior episode ([place]) 12/28/2019  . Diffuse photodamage of skin 02/13/2016  . Basal cell carcinoma 01/11/2016  . Gastroesophageal reflux disease without esophagitis 07/03/2015  . Prediabetes 03/15/2015  . Sprain of rotator cuff capsule 01/11/2015  . Acquired hypothyroidism  10/31/2014  . Allergic rhinitis 10/31/2014  . Benign hypertension 10/31/2014  . CD (celiac disease) 10/31/2014  . Dyslipidemia 10/31/2014  . OP (osteoporosis) 10/31/2014    Allergies  Allergen Reactions  . Ciprofloxacin Hcl Rash    Past Surgical History:  Procedure Laterality Date  . ABDOMINAL HYSTERECTOMY  1984   for bleeding  . Basil cell    .  BILATERAL SALPINGOOPHORECTOMY  1984  . CATARACT EXTRACTION W/PHACO Right 11/24/2016   Procedure: CATARACT EXTRACTION PHACO AND INTRAOCULAR LENS PLACEMENT ([place]) Right;  Surgeon: Leandrew Koyanagi, MD;  Location: [facility];  Service: Ophthalmology;  Laterality: Right;  . CATARACT EXTRACTION W/PHACO Left 02/04/2017   Procedure: CATARACT EXTRACTION PHACO AND INTRAOCULAR LENS PLACEMENT ([place])  Left;  Surgeon: Leandrew Koyanagi, MD;  Location: [facility];  Service: Ophthalmology;  Laterality: Left;  . ORIF WRIST FRACTURE Left 2015  . TYMPANOPLASTY  1992    Social History   Tobacco Use  . Smoking status: Never Smoker  . Smokeless tobacco: Never Used  . Tobacco comment: smoking cessation materials not required  Vaping Use  . Vaping Use: Never used  Substance Use Topics  . Alcohol use: No    Alcohol/week: 0.0 standard drinks  . Drug use: No     Medication list has been reviewed and updated.  Current Meds  Medication Sig  . azelastine (ASTELIN) 0.1 % nasal spray INSTILL 2 SPRAYS IN EACH NOSTRIL TWICE DAILY AS DIRECTED  . Biotin 2500 MCG CAPS Take 1 capsule by mouth daily.  . Cranberry 500 MG CAPS Take 1 capsule by mouth daily.   Marland Kitchen escitalopram (LEXAPRO) 10 MG tablet TAKE ONE (1) TABLET BY MOUTH ONCE DAILY  . fexofenadine (ALLEGRA) 180 MG tablet Take 1 tablet by mouth daily.  . fluticasone (FLONASE) 50 MCG/ACT nasal spray PLACE 2 SPRAYS INTO BOTH NOSTRILS DAILY.  Marland Kitchen Glucosamine-Chondroitin (MOVE FREE PO) Take by mouth daily.  Marland Kitchen levothyroxine (SYNTHROID) 50 MCG tablet TAKE ONE TABLET BY MOUTH DAILY BEFORE BREAKFAST.  Marland Kitchen lisinopril-hydrochlorothiazide (PRINZIDE,ZESTORETIC) 20-12.5 MG tablet Take 1 tablet by mouth daily.  . Multiple Vitamins-Minerals (CENTRUM SILVER PO) Take by mouth daily.  Marland Kitchen omeprazole (PRILOSEC) 20 MG capsule Take 1 capsule (20 mg total) by mouth daily.  . predniSONE (DELTASONE) 20 MG tablet Take 20 mg by mouth daily.  . simvastatin (ZOCOR) 20 MG  tablet TAKE ONE TABLET BY MOUTH DAILY AT 6PM.  . TURMERIC PO Take 1 tablet by mouth daily.     PHQ 2/9 Scores 05/09/2020 04/11/2020 03/28/2020 02/27/2020  PHQ - 2 Score 0 0 0 0  PHQ- 9 Score 0 - 0 0    GAD 7 : Generalized Anxiety Score 05/09/2020 02/27/2020 01/27/2020 01/24/2020  Nervous, Anxious, on Edge 0 0 0 0  Control/stop worrying 0 0 0 0  Worry too much - different things 0 0 0 0  Trouble relaxing 0 0 3 1  Restless 0 0 0 0  Easily annoyed or irritable 0 0 0 0  Afraid - awful might happen 0 0 1 0  Total GAD 7 Score 0 0 4 1  Anxiety Difficulty Not difficult at all Not difficult at all Not difficult at all Not difficult at all    BP Readings from Last 3 Encounters:  05/09/20 134/72  03/28/20 (!) 115/54  02/27/20 (!) 112/54    Physical Exam Vitals and nursing note reviewed.  Constitutional:      General: She is not in acute distress.    Appearance: Normal appearance. She  is well-developed.  HENT:     Head: Normocephalic and atraumatic.  Neck:     Vascular: No carotid bruit.  Cardiovascular:     Rate and Rhythm: Normal rate and regular rhythm.  Pulmonary:     Effort: Pulmonary effort is normal. No respiratory distress.     Breath sounds: No wheezing or rhonchi.  Musculoskeletal:        General: Normal range of motion.     Cervical back: Normal range of motion.     Right knee: No effusion, erythema or ecchymosis. Tenderness (posteriorly) present. No patellar tendon tenderness.     Left knee: Normal. No effusion, erythema or ecchymosis. No patellar tendon tenderness.     Right lower leg: No edema.     Left lower leg: No edema.  Lymphadenopathy:     Cervical: No cervical adenopathy.  Skin:    General: Skin is warm and dry.     Findings: No rash.  Neurological:     Mental Status: She is alert and oriented to person, place, and time.  Psychiatric:        Behavior: Behavior normal.        Thought Content: Thought content normal.     Wt Readings from Last 3 Encounters:   05/09/20 162 lb (73.5 kg)  03/28/20 150 lb (68 kg)  02/27/20 150 lb (68 kg)    BP 134/72   Pulse (!) 56   Temp 98.2 F (36.8 C) (Oral)   Ht 5' 5"  (1.651 m)   Wt 162 lb (73.5 kg)   SpO2 98%   BMI 26.96 kg/m   Assessment and Plan: 1. Benign hypertension Clinically stable exam with well controlled BP. Tolerating medications without side effects at this time. Pt to continue current regimen and low sodium diet; benefits of regular exercise as able discussed.  2. PMR (polymyalgia rheumatica) (HCC) Symptoms are minimal on 20 mg per day Will taper down to 15 mg per day and follow up in 6 weeks  - Sedimentation rate  3. Prediabetes Continue to limit carbs; weight will be hard to manage on higher dose prednisone so hopefully can reduce dose - Basic metabolic panel - Hemoglobin A1c  4. Strain of right knee, initial encounter Recommend ice as needed Modify activity to avoid knee strain   Partially dictated using Editor, commissioning. Any errors are unintentional.  Halina Maidens, MD [facility]  05/09/2020

## 2020-05-14 LAB — HEMOGLOBIN A1C
Hgb A1c MFr Bld: 7.2 % — ABNORMAL HIGH (ref 4.8–5.6)
Mean Plasma Glucose: 160 mg/dL

## 2020-05-21 ENCOUNTER — Other Ambulatory Visit: Payer: Self-pay | Admitting: Internal Medicine

## 2020-05-29 DIAGNOSIS — Z85828 Personal history of other malignant neoplasm of skin: Secondary | ICD-10-CM | POA: Diagnosis not present

## 2020-05-29 DIAGNOSIS — L578 Other skin changes due to chronic exposure to nonionizing radiation: Secondary | ICD-10-CM | POA: Diagnosis not present

## 2020-05-29 DIAGNOSIS — L814 Other melanin hyperpigmentation: Secondary | ICD-10-CM | POA: Diagnosis not present

## 2020-05-29 DIAGNOSIS — D2371 Other benign neoplasm of skin of right lower limb, including hip: Secondary | ICD-10-CM | POA: Diagnosis not present

## 2020-05-29 DIAGNOSIS — D229 Melanocytic nevi, unspecified: Secondary | ICD-10-CM | POA: Diagnosis not present

## 2020-05-29 DIAGNOSIS — D692 Other nonthrombocytopenic purpura: Secondary | ICD-10-CM | POA: Diagnosis not present

## 2020-05-29 DIAGNOSIS — L820 Inflamed seborrheic keratosis: Secondary | ICD-10-CM | POA: Diagnosis not present

## 2020-06-20 ENCOUNTER — Ambulatory Visit
Admission: RE | Admit: 2020-06-20 | Discharge: 2020-06-20 | Disposition: A | Discharge status: Home / Self Care | Payer: Medicare PPO | Attending: Internal Medicine | Admitting: Internal Medicine

## 2020-06-20 ENCOUNTER — Telehealth (INDEPENDENT_AMBULATORY_CARE_PROVIDER_SITE_OTHER): Payer: Medicare PPO | Admitting: Internal Medicine

## 2020-06-20 ENCOUNTER — Ambulatory Visit
Admission: RE | Admit: 2020-06-20 | Discharge: 2020-06-20 | Disposition: A | Discharge status: Home / Self Care | Payer: Medicare PPO | Source: Ambulatory Visit | Attending: Internal Medicine | Admitting: Internal Medicine

## 2020-06-20 ENCOUNTER — Other Ambulatory Visit: Payer: Self-pay

## 2020-06-20 ENCOUNTER — Encounter: Payer: Self-pay | Admitting: Internal Medicine

## 2020-06-20 ENCOUNTER — Telehealth: Payer: Self-pay

## 2020-06-20 DIAGNOSIS — M353 Polymyalgia rheumatica: Secondary | ICD-10-CM | POA: Diagnosis not present

## 2020-06-20 DIAGNOSIS — M25562 Pain in left knee: Secondary | ICD-10-CM | POA: Diagnosis not present

## 2020-06-20 DIAGNOSIS — M25561 Pain in right knee: Secondary | ICD-10-CM | POA: Insufficient documentation

## 2020-06-20 DIAGNOSIS — I708 Atherosclerosis of other arteries: Secondary | ICD-10-CM | POA: Diagnosis not present

## 2020-06-20 DIAGNOSIS — M1712 Unilateral primary osteoarthritis, left knee: Secondary | ICD-10-CM | POA: Diagnosis not present

## 2020-06-20 NOTE — Telephone Encounter (Signed)
This visit type is being conducted due to national recommendations for restrictions regarding the COVID- 19 Pandemic (e.g. social distancing) in effort to limit this patients exposure and mitigate transmission in our community. This visit type is felt to be most appropriate for this patient at this time. I connected with the patient today and received telephone consent from the patient and patient understand this consent will be good for 1 year.   KP

## 2020-06-20 NOTE — Progress Notes (Signed)
Date:  06/20/2020   Name:  Jill Dunlap   DOB:  October 05, 1934   MRN:  [id number]  I connected with this patient, Jill Dunlap, by telephone at the patient's home.  I verified that I am speaking with the correct person using two identifiers. This visit was conducted via telephone due to the Covid-19 outbreak from my office at [facility] in [place], [place].  The patient does not have access to a video connection. I discussed the limitations, risks, security and privacy concerns of performing an evaluation and management service by telephone. I also discussed with the patient that there may be a patient responsible charge related to this service. The patient expressed understanding and agreed to proceed.  Chief Complaint: PMR (f/u)  Muscle Pain This is a chronic problem. The problem has been resolved (sx remain resolved on prednisone 15 mg per day which she initiated 2 weeks ago) since onset. The patient is experiencing no pain. Pertinent negatives include no chest pain, fatigue, fever, joint swelling, shortness of breath or weakness.  Knee Pain  The incident occurred more than 1 week ago. The incident occurred at home (twisted both knees after stepping into a hole in her yard). The injury mechanism was a twisting injury. The pain is present in the left knee and right knee. The quality of the pain is described as aching. The pain has been constant since onset. Associated symptoms include an inability to bear weight. Pertinent negatives include no muscle weakness, numbness or tingling. The symptoms are aggravated by weight bearing. She has tried rest and ice for the symptoms. The treatment provided no relief.    Lab Results  Component Value Date   CREATININE 1.00 05/09/2020   BUN 24 (H) 05/09/2020   NA 133 (L) 05/09/2020   K 3.8 05/09/2020   CL 94 (L) 05/09/2020   CO2 29 05/09/2020   Lab Results  Component Value Date   CHOL 168 11/21/2019   HDL 69 11/21/2019   LDLCALC 81  11/21/2019   TRIG 103 11/21/2019   CHOLHDL 2.4 11/21/2019   Lab Results  Component Value Date   TSH 1.420 11/21/2019   Lab Results  Component Value Date   HGBA1C 7.2 (H) 05/09/2020   Lab Results  Component Value Date   WBC 5.6 11/21/2019   HGB 13.4 11/21/2019   HCT 41.2 11/21/2019   MCV 90 11/21/2019   PLT 180 11/21/2019   Lab Results  Component Value Date   ALT 10 01/27/2020   AST 8 01/27/2020   ALKPHOS 52 01/27/2020   BILITOT 0.6 01/27/2020     Review of Systems  Constitutional: Negative for chills, fatigue, fever and unexpected weight change.  Respiratory: Negative for cough, chest tightness and shortness of breath.   Cardiovascular: Negative for chest pain and leg swelling.  Musculoskeletal: Positive for arthralgias and gait problem. Negative for joint swelling and myalgias.  Neurological: Negative for tingling, weakness and numbness.    Patient Active Problem List   Diagnosis Date Noted  . Disorder of bursae of shoulder region 02/02/2020  . PMR (polymyalgia rheumatica) (HCC) 02/02/2020  . Primary osteoarthritis of left hip 01/24/2020  . Hip pain, chronic, unspecified laterality 12/28/2019  . Current moderate episode of major depressive disorder without prior episode ([place]) 12/28/2019  . Diffuse photodamage of skin 02/13/2016  . Basal cell carcinoma 01/11/2016  . Gastroesophageal reflux disease without esophagitis 07/03/2015  . Prediabetes 03/15/2015  . Sprain of rotator cuff capsule 01/11/2015  .  Acquired hypothyroidism 10/31/2014  . Allergic rhinitis 10/31/2014  . Benign hypertension 10/31/2014  . CD (celiac disease) 10/31/2014  . Dyslipidemia 10/31/2014  . OP (osteoporosis) 10/31/2014    Allergies  Allergen Reactions  . Ciprofloxacin Hcl Rash    Past Surgical History:  Procedure Laterality Date  . ABDOMINAL HYSTERECTOMY  1984   for bleeding  . Basil cell    . BILATERAL SALPINGOOPHORECTOMY  1984  . CATARACT EXTRACTION W/PHACO Right 11/24/2016    Procedure: CATARACT EXTRACTION PHACO AND INTRAOCULAR LENS PLACEMENT ([place]) Right;  Surgeon: Leandrew Koyanagi, MD;  Location: [facility];  Service: Ophthalmology;  Laterality: Right;  . CATARACT EXTRACTION W/PHACO Left 02/04/2017   Procedure: CATARACT EXTRACTION PHACO AND INTRAOCULAR LENS PLACEMENT ([place])  Left;  Surgeon: Leandrew Koyanagi, MD;  Location: [facility];  Service: Ophthalmology;  Laterality: Left;  . ORIF WRIST FRACTURE Left 2015  . TYMPANOPLASTY  1992    Social History   Tobacco Use  . Smoking status: Never Smoker  . Smokeless tobacco: Never Used  . Tobacco comment: smoking cessation materials not required  Vaping Use  . Vaping Use: Never used  Substance Use Topics  . Alcohol use: No    Alcohol/week: 0.0 standard drinks  . Drug use: No     Medication list has been reviewed and updated.  Current Meds  Medication Sig  . azelastine (ASTELIN) 0.1 % nasal spray INSTILL 2 SPRAYS IN EACH NOSTRIL TWICE DAILY AS DIRECTED  . Biotin 2500 MCG CAPS Take 1 capsule by mouth daily.  . Cranberry 500 MG CAPS Take 1 capsule by mouth daily.   Marland Kitchen escitalopram (LEXAPRO) 10 MG tablet TAKE ONE (1) TABLET BY MOUTH ONCE DAILY  . fexofenadine (ALLEGRA) 180 MG tablet Take 1 tablet by mouth daily.  . fluticasone (FLONASE) 50 MCG/ACT nasal spray PLACE 2 SPRAYS INTO BOTH NOSTRILS DAILY.  Marland Kitchen Glucosamine-Chondroitin (MOVE FREE PO) Take by mouth daily.  Marland Kitchen levothyroxine (SYNTHROID) 50 MCG tablet TAKE (1) TABLET BY MOUTH DAILY BEFORE BREAKFAST.  Marland Kitchen lisinopril-hydrochlorothiazide (PRINZIDE,ZESTORETIC) 20-12.5 MG tablet Take 1 tablet by mouth daily.  . Multiple Vitamins-Minerals (CENTRUM SILVER PO) Take by mouth daily.  Marland Kitchen omeprazole (PRILOSEC) 20 MG capsule Take 1 capsule (20 mg total) by mouth daily.  . predniSONE (DELTASONE) 5 MG tablet Take 4 tablets (20 mg total) by mouth daily. (Patient taking differently: Take 15 mg by mouth daily. )  . simvastatin (ZOCOR) 20 MG tablet TAKE ONE  TABLET BY MOUTH DAILY AT 6PM.  . TURMERIC PO Take 1 tablet by mouth daily.     PHQ 2/9 Scores 06/20/2020 05/09/2020 04/11/2020 03/28/2020  PHQ - 2 Score 0 0 0 0  PHQ- 9 Score 0 0 - 0    GAD 7 : Generalized Anxiety Score 06/20/2020 05/09/2020 02/27/2020 01/27/2020  Nervous, Anxious, on Edge 0 0 0 0  Control/stop worrying 0 0 0 0  Worry too much - different things 0 0 0 0  Trouble relaxing 0 0 0 3  Restless 0 0 0 0  Easily annoyed or irritable 0 0 0 0  Afraid - awful might happen 0 0 0 1  Total GAD 7 Score 0 0 0 4  Anxiety Difficulty - Not difficult at all Not difficult at all Not difficult at all    BP Readings from Last 3 Encounters:  05/09/20 134/72  03/28/20 (!) 115/54  02/27/20 (!) 112/54    Physical Exam Pulmonary:     Effort: Pulmonary effort is normal.  Comments: No cough or dyspnea noted during the call Neurological:     Mental Status: She is alert.  Psychiatric:        Attention and Perception: Attention normal.        Mood and Affect: Mood normal.        Speech: Speech normal.        Cognition and Memory: Cognition normal.     Wt Readings from Last 3 Encounters:  05/09/20 162 lb (73.5 kg)  03/28/20 150 lb (68 kg)  02/27/20 150 lb (68 kg)    There were no vitals taken for this visit.  Assessment and Plan: 1. PMR (polymyalgia rheumatica) (HCC) Continue prednisone 15 mg per day for 6 more weeks the return for follow up and labs in person  2. Pain in both knees, unspecified chronicity May take tylenol and use topical rubs Will get xrays - pt requests this prior to Ortho referral If persistent will need Ortho evaluation - DG Knee Complete 4 Views Right; Future - DG Knee Complete 4 Views Left; Future  I spent 12 minutes on this encounter. Partially dictated using Editor, commissioning. Any errors are unintentional.  Halina Maidens, MD [facility]  06/20/2020

## 2020-06-22 ENCOUNTER — Telehealth: Payer: Self-pay

## 2020-06-22 NOTE — Telephone Encounter (Signed)
Called pt let her know that her RF was sent in to [facility]. Pt verbalized understanding. KP

## 2020-06-22 NOTE — Telephone Encounter (Unsigned)
Copied from [phone]. Topic: General - Other >> Jun 21, 2020  3:23 PM Celene Kras wrote: Reason for CRM: Pt called stating that she received a call regarding a VM she had on her phone. She states that the message stated it was regarding a refill. Please advise.

## 2020-07-15 ENCOUNTER — Encounter: Payer: Self-pay | Admitting: Internal Medicine

## 2020-07-16 NOTE — Telephone Encounter (Signed)
Pts response. Pt has appt. 08/01/20.  KP

## 2020-07-18 ENCOUNTER — Other Ambulatory Visit: Payer: Self-pay | Admitting: Internal Medicine

## 2020-07-18 DIAGNOSIS — K219 Gastro-esophageal reflux disease without esophagitis: Secondary | ICD-10-CM

## 2020-08-01 ENCOUNTER — Ambulatory Visit: Payer: Medicare PPO | Admitting: Internal Medicine

## 2020-08-01 ENCOUNTER — Ambulatory Visit
Admission: RE | Admit: 2020-08-01 | Discharge: 2020-08-01 | Disposition: A | Discharge status: Home / Self Care | Payer: Medicare PPO | Attending: Internal Medicine | Admitting: Internal Medicine

## 2020-08-01 ENCOUNTER — Encounter: Payer: Self-pay | Admitting: Internal Medicine

## 2020-08-01 ENCOUNTER — Ambulatory Visit
Admission: RE | Admit: 2020-08-01 | Discharge: 2020-08-01 | Disposition: A | Discharge status: Home / Self Care | Payer: Medicare PPO | Source: Ambulatory Visit | Attending: Internal Medicine | Admitting: Internal Medicine

## 2020-08-01 ENCOUNTER — Other Ambulatory Visit: Payer: Self-pay

## 2020-08-01 VITALS — BP 128/64 | HR 67 | Temp 98.2°F | Ht 65.0 in | Wt 166.0 lb

## 2020-08-01 DIAGNOSIS — M353 Polymyalgia rheumatica: Secondary | ICD-10-CM | POA: Diagnosis not present

## 2020-08-01 DIAGNOSIS — E118 Type 2 diabetes mellitus with unspecified complications: Secondary | ICD-10-CM

## 2020-08-01 DIAGNOSIS — M25552 Pain in left hip: Secondary | ICD-10-CM

## 2020-08-01 DIAGNOSIS — G8929 Other chronic pain: Secondary | ICD-10-CM | POA: Insufficient documentation

## 2020-08-01 DIAGNOSIS — F3341 Major depressive disorder, recurrent, in partial remission: Secondary | ICD-10-CM | POA: Diagnosis not present

## 2020-08-01 DIAGNOSIS — I700 Atherosclerosis of aorta: Secondary | ICD-10-CM | POA: Diagnosis not present

## 2020-08-01 HISTORY — DX: Type 2 diabetes mellitus with unspecified complications: E11.8

## 2020-08-01 MED ORDER — TRAMADOL HCL 50 MG PO TABS: 25.0000 mg | ORAL_TABLET | Freq: Three times a day (TID) | ORAL | 0 refills | Status: DC | PRN

## 2020-08-01 NOTE — Progress Notes (Addendum)
Date:  08/01/2020   Name:  Jill Dunlap   DOB:  [birth year]   MRN:  [id number]   Chief Complaint: PMR  (Follow up )  Hip Pain  There was no injury mechanism. The pain is present in the left hip. The pain is moderate. The pain has been fluctuating since onset. Associated symptoms include an inability to bear weight. Pertinent negatives include no muscle weakness or numbness. She has tried acetaminophen for the symptoms. The treatment provided no relief.  Diabetes She presents for her follow-up diabetic visit. She has type 2 diabetes mellitus. Her disease course has been stable. Pertinent negatives for hypoglycemia include no dizziness or headaches. Pertinent negatives for diabetes include no chest pain and no fatigue. There are no diabetic complications. Current diabetic treatment includes diet. Her weight is stable. She is following a generally healthy diet. An ACE inhibitor/angiotensin II receptor blocker is being taken.  Depression        This is a chronic problem.  The problem has been resolved since onset.  Associated symptoms include no fatigue, does not have insomnia, no myalgias, no headaches and no suicidal ideas.  Past treatments include SSRIs - Selective serotonin reuptake inhibitors.  Compliance with treatment is good.  PMR - diagnosed last summer.  Started on prednisone daily with dramatic resolution of symptoms.  Most recently down to 15 mg per day.  Several weeks ago complained for left hip pain - advised to increase the prednisone to 20 mg for 5 days then decrease again.  Lab Results  Component Value Date   CREATININE 1.00 05/09/2020   BUN 24 (H) 05/09/2020   NA 133 (L) 05/09/2020   K 3.8 05/09/2020   CL 94 (L) 05/09/2020   CO2 29 05/09/2020   Lab Results  Component Value Date   CHOL 168 11/21/2019   HDL 69 11/21/2019   LDLCALC 81 11/21/2019   TRIG 103 11/21/2019   CHOLHDL 2.4 11/21/2019   Lab Results  Component Value Date   TSH 1.420 11/21/2019   Lab  Results  Component Value Date   HGBA1C 7.2 (H) 05/09/2020   Lab Results  Component Value Date   WBC 5.6 11/21/2019   HGB 13.4 11/21/2019   HCT 41.2 11/21/2019   MCV 90 11/21/2019   PLT 180 11/21/2019   Lab Results  Component Value Date   ALT 10 01/27/2020   AST 8 01/27/2020   ALKPHOS 52 01/27/2020   BILITOT 0.6 01/27/2020     Review of Systems  Constitutional: Negative for chills, fatigue, fever and unexpected weight change.  Respiratory: Negative for chest tightness and shortness of breath.   Cardiovascular: Negative for chest pain, palpitations and leg swelling.  Gastrointestinal: Negative for abdominal pain.  Musculoskeletal: Positive for arthralgias and gait problem. Negative for myalgias.  Neurological: Negative for dizziness, numbness and headaches.  Psychiatric/Behavioral: Positive for depression. Negative for suicidal ideas. The patient does not have insomnia.     Patient Active Problem List   Diagnosis Date Noted  . Aortic atherosclerosis ([place]) 08/01/2020  . Disorder of bursae of shoulder region 02/02/2020  . PMR (polymyalgia rheumatica) (HCC) 02/02/2020  . Primary osteoarthritis of left hip 01/24/2020  . Hip pain, chronic, unspecified laterality 12/28/2019  . Recurrent major depression in partial remission ([place]) 12/28/2019  . Diffuse photodamage of skin 02/13/2016  . Basal cell carcinoma 01/11/2016  . Gastroesophageal reflux disease without esophagitis 07/03/2015  . Type II diabetes mellitus with complication ([place]) 40/98/1191  . Acquired hypothyroidism  10/31/2014  . Allergic rhinitis 10/31/2014  . Benign hypertension 10/31/2014  . CD (celiac disease) 10/31/2014  . Dyslipidemia 10/31/2014  . OP (osteoporosis) 10/31/2014    Allergies  Allergen Reactions  . Ciprofloxacin Hcl Rash    Past Surgical History:  Procedure Laterality Date  . ABDOMINAL HYSTERECTOMY  1984   for bleeding  . Basil cell    . BILATERAL SALPINGOOPHORECTOMY  1984  . CATARACT  EXTRACTION W/PHACO Right 11/24/2016   Procedure: CATARACT EXTRACTION PHACO AND INTRAOCULAR LENS PLACEMENT ([place]) Right;  Surgeon: Leandrew Koyanagi, MD;  Location: [place];  Service: Ophthalmology;  Laterality: Right;  . CATARACT EXTRACTION W/PHACO Left 02/04/2017   Procedure: CATARACT EXTRACTION PHACO AND INTRAOCULAR LENS PLACEMENT ([place])  Left;  Surgeon: Leandrew Koyanagi, MD;  Location: [place];  Service: Ophthalmology;  Laterality: Left;  . ORIF WRIST FRACTURE Left 2015  . TYMPANOPLASTY  1992    Social History   Tobacco Use  . Smoking status: Never Smoker  . Smokeless tobacco: Never Used  . Tobacco comment: smoking cessation materials not required  Vaping Use  . Vaping Use: Never used  Substance Use Topics  . Alcohol use: No    Alcohol/week: 0.0 standard drinks  . Drug use: No     Medication list has been reviewed and updated.  Current Meds  Medication Sig  . azelastine (ASTELIN) 0.1 % nasal spray INSTILL 2 SPRAYS IN EACH NOSTRIL TWICE DAILY AS DIRECTED  . Biotin 2500 MCG CAPS Take 1 capsule by mouth daily.  Marland Kitchen CALCIUM PO Take by mouth daily.  . Cranberry 500 MG CAPS Take 1 capsule by mouth daily.   Marland Kitchen escitalopram (LEXAPRO) 10 MG tablet TAKE ONE (1) TABLET BY MOUTH ONCE DAILY  . fexofenadine (ALLEGRA) 180 MG tablet Take 1 tablet by mouth daily.  . fluticasone (FLONASE) 50 MCG/ACT nasal spray PLACE 2 SPRAYS INTO BOTH NOSTRILS DAILY.  Marland Kitchen Glucosamine-Chondroitin (MOVE FREE PO) Take by mouth daily.  Marland Kitchen levothyroxine (SYNTHROID) 50 MCG tablet TAKE (1) TABLET BY MOUTH DAILY BEFORE BREAKFAST.  Marland Kitchen lisinopril-hydrochlorothiazide (PRINZIDE,ZESTORETIC) 20-12.5 MG tablet Take 1 tablet by mouth daily.  . Multiple Vitamins-Minerals (CENTRUM SILVER PO) Take by mouth daily.  Marland Kitchen omeprazole (PRILOSEC) 20 MG capsule TAKE ONE (1) CAPSULE EACH DAY.  Marland Kitchen predniSONE (DELTASONE) 5 MG tablet Take 4 tablets (20 mg total) by mouth daily. (Patient taking differently: Take 15 mg by  mouth daily.)  . simvastatin (ZOCOR) 20 MG tablet TAKE ONE TABLET BY MOUTH DAILY AT 6PM.  . TURMERIC PO Take 1 tablet by mouth daily.     PHQ 2/9 Scores 08/01/2020 06/20/2020 05/09/2020 04/11/2020  PHQ - 2 Score 0 0 0 0  PHQ- 9 Score 0 0 0 -    GAD 7 : Generalized Anxiety Score 08/01/2020 06/20/2020 05/09/2020 02/27/2020  Nervous, Anxious, on Edge 0 0 0 0  Control/stop worrying 0 0 0 0  Worry too much - different things 0 0 0 0  Trouble relaxing 0 0 0 0  Restless 0 0 0 0  Easily annoyed or irritable 0 0 0 0  Afraid - awful might happen 0 0 0 0  Total GAD 7 Score 0 0 0 0  Anxiety Difficulty - - Not difficult at all Not difficult at all    BP Readings from Last 3 Encounters:  08/01/20 128/64  05/09/20 134/72  03/28/20 (!) 115/54    Physical Exam Vitals and nursing note reviewed.  Constitutional:      General: She is not  in acute distress.    Appearance: Normal appearance. She is well-developed.  HENT:     Head: Normocephalic and atraumatic.  Cardiovascular:     Rate and Rhythm: Normal rate and regular rhythm.     Pulses:          Dorsalis pedis pulses are 2+ on the right side and 2+ on the left side.       Posterior tibial pulses are 2+ on the right side and 2+ on the left side.  Pulmonary:     Effort: Pulmonary effort is normal. No respiratory distress.     Breath sounds: No wheezing or rhonchi.  Musculoskeletal:     Cervical back: Normal range of motion.     Lumbar back: Negative right straight leg raise test and negative left straight leg raise test.     Right hip: No tenderness. Normal range of motion.     Left hip: Tenderness present. Decreased range of motion.     Right lower leg: No edema.     Left lower leg: No edema.     Right ankle: Normal.     Left ankle: No swelling or deformity. Tenderness (posteriorly) present. Normal range of motion. Normal pulse.  Skin:    General: Skin is warm and dry.     Findings: No rash.  Neurological:     Mental Status: She is  alert and oriented to person, place, and time.  Psychiatric:        Attention and Perception: Attention normal.        Mood and Affect: Mood and affect and mood normal.    Diabetic Foot Form - Detailed   Diabetic Foot Exam - detailed Diabetic Foot exam was performed with the following findings: Yes 08/01/2020  2:08 PM  Visual Foot Exam completed.: Yes  Pulse Foot Exam completed.: Yes  Sensory Foot Exam Completed.: Yes Semmes-Weinstein Monofilament Test        Wt Readings from Last 3 Encounters:  08/01/20 166 lb (75.3 kg)  05/09/20 162 lb (73.5 kg)  03/28/20 150 lb (68 kg)    BP 128/64   Pulse 67   Temp 98.2 F (36.8 C) (Oral)   Ht 5' 5"  (1.651 m)   Wt 166 lb (75.3 kg)   SpO2 97%   BMI 27.62 kg/m   Assessment and Plan: 1. PMR (polymyalgia rheumatica) (HCC) Improved - decrease prednisone to 12.5 mg per day for one month then to 10 mg per day Follow up in 2 months - Sedimentation rate  2. Type II diabetes mellitus with complication (HCC) Due to steroid use Continue low carb diet - Hemoglobin A1c  3. Recurrent major depression in partial remission (HCC) Clinically stable on current regimen with good control of symptoms, No SI or HI. Will continue current therapy with Lexapro.  4. Chronic hip pain, left Will get xrays Tramadol prn - DG Hip Unilat W OR W/O Pelvis 2-3 Views Left; Future - traMADol (ULTRAM) 50 MG tablet; Take 0.5-1 tablets (25-50 mg total) by mouth every 8 (eight) hours as needed.  Dispense: 30 tablet; Refill: 0  5. Aortic atherosclerosis ([place]) On statin therapy   Partially dictated using Editor, commissioning. Any errors are unintentional.  Halina Maidens, MD [facility]  08/01/2020

## 2020-08-01 NOTE — Patient Instructions (Addendum)
Prednisone - 12.5 mg daily for one month the 10 mg daily.  Follow up in 2 months  Tramadol 50 mg 1/2 -1 orally three times a day as needed for pain

## 2020-08-02 ENCOUNTER — Telehealth: Payer: Self-pay

## 2020-08-02 LAB — HEMOGLOBIN A1C
Est. average glucose Bld gHb Est-mCnc: 154 mg/dL
Hgb A1c MFr Bld: 7 % — ABNORMAL HIGH (ref 4.8–5.6)

## 2020-08-02 LAB — SEDIMENTATION RATE: Sed Rate: 2 mm/hr (ref 0–40)

## 2020-08-02 NOTE — Telephone Encounter (Signed)
PA completed waiting for approval.  Key: [id number]  KP

## 2020-08-10 ENCOUNTER — Other Ambulatory Visit: Payer: Self-pay | Admitting: Internal Medicine

## 2020-08-13 ENCOUNTER — Other Ambulatory Visit: Payer: Self-pay | Admitting: Internal Medicine

## 2020-08-21 NOTE — Telephone Encounter (Signed)
Covermymeds- PA is authorized without PA.  KP

## 2020-08-30 ENCOUNTER — Other Ambulatory Visit: Payer: Self-pay | Admitting: Internal Medicine

## 2020-08-30 DIAGNOSIS — E785 Hyperlipidemia, unspecified: Secondary | ICD-10-CM

## 2020-09-02 ENCOUNTER — Encounter: Payer: Self-pay | Admitting: Internal Medicine

## 2020-09-03 ENCOUNTER — Other Ambulatory Visit: Payer: Self-pay

## 2020-09-03 ENCOUNTER — Other Ambulatory Visit: Payer: Self-pay | Admitting: Internal Medicine

## 2020-09-03 DIAGNOSIS — M79674 Pain in right toe(s): Secondary | ICD-10-CM | POA: Diagnosis not present

## 2020-09-03 DIAGNOSIS — M79675 Pain in left toe(s): Secondary | ICD-10-CM | POA: Diagnosis not present

## 2020-09-03 DIAGNOSIS — M353 Polymyalgia rheumatica: Secondary | ICD-10-CM

## 2020-09-03 DIAGNOSIS — B351 Tinea unguium: Secondary | ICD-10-CM | POA: Diagnosis not present

## 2020-09-03 MED ORDER — PREDNISONE 1 MG PO TABS: ORAL_TABLET | ORAL | 1 refills | Status: DC

## 2020-09-03 MED ORDER — PREDNISONE 10 MG PO TABS: ORAL_TABLET | ORAL | 1 refills | Status: DC

## 2020-09-03 MED ORDER — PREDNISONE 10 MG PO TABS: 10.0000 mg | ORAL_TABLET | Freq: Every day | ORAL | 0 refills | Status: DC

## 2020-09-03 MED ORDER — PREDNISONE 1 MG PO TABS: 2.0000 mg | ORAL_TABLET | Freq: Every day | ORAL | 0 refills | Status: DC

## 2020-09-27 ENCOUNTER — Encounter: Payer: Self-pay | Admitting: Internal Medicine

## 2020-10-01 ENCOUNTER — Encounter: Payer: Self-pay | Admitting: Internal Medicine

## 2020-10-01 ENCOUNTER — Ambulatory Visit (INDEPENDENT_AMBULATORY_CARE_PROVIDER_SITE_OTHER): Payer: Medicare PPO | Admitting: Internal Medicine

## 2020-10-01 ENCOUNTER — Telehealth: Payer: Self-pay

## 2020-10-01 DIAGNOSIS — M353 Polymyalgia rheumatica: Secondary | ICD-10-CM | POA: Diagnosis not present

## 2020-10-01 DIAGNOSIS — M1612 Unilateral primary osteoarthritis, left hip: Secondary | ICD-10-CM | POA: Diagnosis not present

## 2020-10-01 NOTE — Telephone Encounter (Signed)
This visit type is being conducted due to national recommendations for restrictions regarding the COVID- 19 Pandemic (e.g. social distancing) in effort to limit this patients exposure and mitigate transmission in our community. This visit type is felt to be most appropriate for this patient at this time. I connected with the patient today and received telephone consent from the patient and patient understand this consent will be good for 1 year.  KP

## 2020-10-01 NOTE — Progress Notes (Signed)
Date:  10/01/2020   Name:  Jill Dunlap   DOB:  Apr 06, 1935   MRN:  [id number]  I connected with this patient, Jill Dunlap, by telephone at the patient's home.  I verified that I am speaking with the correct person using two identifiers. This visit was conducted via telephone due to the Covid-19 outbreak from my office at [facility] in [place], [place]. I discussed the limitations, risks, security and privacy concerns of performing an evaluation and management service by telephone. I also discussed with the patient that there may be a patient responsible charge related to this service. The patient expressed understanding and agreed to proceed.  Chief Complaint: Follow-up (PMR)  HPI PMR - she reports doing great.  No pain, weakness or stiffness.  Getting up and down without difficulty.  Tolerating prednisone well.  Mild weight gain.  No thrush or yeast sx.  No blurred vision, chest pain or SOB.  Lab Results  Component Value Date   CREATININE 1.00 05/09/2020   BUN 24 (H) 05/09/2020   NA 133 (L) 05/09/2020   K 3.8 05/09/2020   CL 94 (L) 05/09/2020   CO2 29 05/09/2020   Lab Results  Component Value Date   CHOL 168 11/21/2019   HDL 69 11/21/2019   LDLCALC 81 11/21/2019   TRIG 103 11/21/2019   CHOLHDL 2.4 11/21/2019   Lab Results  Component Value Date   TSH 1.420 11/21/2019   Lab Results  Component Value Date   HGBA1C 7.0 (H) 08/01/2020   Lab Results  Component Value Date   WBC 5.6 11/21/2019   HGB 13.4 11/21/2019   HCT 41.2 11/21/2019   MCV 90 11/21/2019   PLT 180 11/21/2019   Lab Results  Component Value Date   ALT 10 01/27/2020   AST 8 01/27/2020   ALKPHOS 52 01/27/2020   BILITOT 0.6 01/27/2020     Review of Systems  Constitutional: Negative for chills, fatigue, fever and unexpected weight change.  HENT: Negative for mouth sores, trouble swallowing and voice change.   Respiratory: Negative for chest tightness, shortness of breath and wheezing.    Cardiovascular: Negative for chest pain and palpitations.  Gastrointestinal: Negative for abdominal pain.  Musculoskeletal: Negative for arthralgias, gait problem, joint swelling and myalgias.  Neurological: Negative for dizziness, light-headedness and headaches.  Psychiatric/Behavioral: Negative for dysphoric mood and sleep disturbance. The patient is not nervous/anxious.     Patient Active Problem List   Diagnosis Date Noted  . Aortic atherosclerosis ([place]) 08/01/2020  . Disorder of bursae of shoulder region 02/02/2020  . PMR (polymyalgia rheumatica) (HCC) 02/02/2020  . Primary osteoarthritis of left hip 01/24/2020  . Hip pain, chronic, unspecified laterality 12/28/2019  . Recurrent major depression in partial remission ([place]) 12/28/2019  . Diffuse photodamage of skin 02/13/2016  . Basal cell carcinoma 01/11/2016  . Gastroesophageal reflux disease without esophagitis 07/03/2015  . Type II diabetes mellitus with complication ([place]) 10/09/2246  . Acquired hypothyroidism 10/31/2014  . Allergic rhinitis 10/31/2014  . Benign hypertension 10/31/2014  . CD (celiac disease) 10/31/2014  . Dyslipidemia 10/31/2014  . OP (osteoporosis) 10/31/2014    Allergies  Allergen Reactions  . Ciprofloxacin Hcl Rash    Past Surgical History:  Procedure Laterality Date  . ABDOMINAL HYSTERECTOMY  1984   for bleeding  . Basil cell    . BILATERAL SALPINGOOPHORECTOMY  1984  . CATARACT EXTRACTION W/PHACO Right 11/24/2016   Procedure: CATARACT EXTRACTION PHACO AND INTRAOCULAR LENS PLACEMENT ([place]) Right;  Surgeon: Leandrew Koyanagi, MD;  Location: [place];  Service: Ophthalmology;  Laterality: Right;  . CATARACT EXTRACTION W/PHACO Left 02/04/2017   Procedure: CATARACT EXTRACTION PHACO AND INTRAOCULAR LENS PLACEMENT ([place])  Left;  Surgeon: Leandrew Koyanagi, MD;  Location: [place];  Service: Ophthalmology;  Laterality: Left;  . ORIF WRIST FRACTURE Left 2015  . TYMPANOPLASTY  1992     Social History   Tobacco Use  . Smoking status: Never Smoker  . Smokeless tobacco: Never Used  . Tobacco comment: smoking cessation materials not required  Vaping Use  . Vaping Use: Never used  Substance Use Topics  . Alcohol use: No    Alcohol/week: 0.0 standard drinks  . Drug use: No     Medication list has been reviewed and updated.  Current Meds  Medication Sig  . azelastine (ASTELIN) 0.1 % nasal spray INSTILL 2 SPRAYS IN EACH NOSTRIL TWICE DAILY AS DIRECTED  . Biotin 2500 MCG CAPS Take 1 capsule by mouth daily.  Marland Kitchen CALCIUM PO Take by mouth daily.  . Cranberry 500 MG CAPS Take 1 capsule by mouth daily.   Marland Kitchen escitalopram (LEXAPRO) 10 MG tablet TAKE ONE (1) TABLET BY MOUTH ONCE DAILY  . fexofenadine (ALLEGRA) 180 MG tablet Take 1 tablet by mouth daily.  . fluticasone (FLONASE) 50 MCG/ACT nasal spray PLACE 2 SPRAYS INTO BOTH NOSTRILS DAILY.  Marland Kitchen Glucosamine-Chondroitin (MOVE FREE PO) Take by mouth daily.  Marland Kitchen levothyroxine (SYNTHROID) 50 MCG tablet TAKE (1) TABLET BY MOUTH DAILY BEFORE BREAKFAST.  Marland Kitchen lisinopril-hydrochlorothiazide (PRINZIDE,ZESTORETIC) 20-12.5 MG tablet Take 1 tablet by mouth daily.  . Multiple Vitamins-Minerals (CENTRUM SILVER PO) Take by mouth daily.  Marland Kitchen omeprazole (PRILOSEC) 20 MG capsule TAKE ONE (1) CAPSULE EACH DAY.  Marland Kitchen predniSONE (DELTASONE) 1 MG tablet Take 2 tablets (2 mg total) by mouth daily with breakfast.  . predniSONE (DELTASONE) 10 MG tablet Take 1 tablet (10 mg total) by mouth daily with breakfast.  . simvastatin (ZOCOR) 20 MG tablet TAKE ONE TABLET BY MOUTH DAILY AT 6PM.  . TURMERIC PO Take 1 tablet by mouth daily.     PHQ 2/9 Scores 10/01/2020 08/01/2020 06/20/2020 05/09/2020  PHQ - 2 Score 0 0 0 0  PHQ- 9 Score 0 0 0 0    GAD 7 : Generalized Anxiety Score 10/01/2020 08/01/2020 06/20/2020 05/09/2020  Nervous, Anxious, on Edge 0 0 0 0  Control/stop worrying 0 0 0 0  Worry too much - different things 0 0 0 0  Trouble relaxing 0 0 0 0  Restless 0  0 0 0  Easily annoyed or irritable 0 0 0 0  Afraid - awful might happen 0 0 0 0  Total GAD 7 Score 0 0 0 0  Anxiety Difficulty - - - Not difficult at all    BP Readings from Last 3 Encounters:  08/01/20 128/64  05/09/20 134/72  03/28/20 (!) 115/54    Physical Exam Pulmonary:     Comments: No cough or dyspnea noted during the call Neurological:     Mental Status: She is alert.  Psychiatric:        Attention and Perception: Attention normal.        Mood and Affect: Mood normal.        Speech: Speech normal.        Cognition and Memory: Cognition normal.     Wt Readings from Last 3 Encounters:  08/01/20 166 lb (75.3 kg)  05/09/20 162 lb (73.5 kg)  03/28/20 150 lb (  68 kg)   No vital signs are available to update. There were no vitals taken for this visit.  Assessment and Plan: 1. PMR (polymyalgia rheumatica) (HCC) Doing well with no sx on 12 mg per day prednsione Will decrease to 11 mg x one month then to 10 mg until next visit F/u in person with labs in May  2. Primary osteoarthritis of left hip Intermittent pain - not requiring additional medications  I spent 12 minutes on this encounter, including review of labs and medication counseling. Partially dictated using Editor, commissioning. Any errors are unintentional.  Halina Maidens, MD [facility]  10/01/2020

## 2020-10-09 ENCOUNTER — Other Ambulatory Visit: Payer: Self-pay | Admitting: Internal Medicine

## 2020-10-09 DIAGNOSIS — F321 Major depressive disorder, single episode, moderate: Secondary | ICD-10-CM

## 2020-11-19 ENCOUNTER — Other Ambulatory Visit: Payer: Self-pay | Admitting: Internal Medicine

## 2020-11-19 NOTE — Telephone Encounter (Signed)
  Notes to clinic:  Patient has appt on 11/22/2020 Review for refill   Requested Prescriptions  Pending Prescriptions Disp Refills   levothyroxine (SYNTHROID) 50 MCG tablet [Pharmacy Med Name: LEVOTHYROXINE SODIUM 50 MCG TAB] 90 tablet 0    Sig: TAKE (1) TABLET BY MOUTH DAILY BEFORE BREAKFAST.      Endocrinology:  Hypothyroid Agents Failed - 11/19/2020 10:44 AM      Failed - TSH needs to be rechecked within 3 months after an abnormal result. Refill until TSH is due.      Failed - TSH in normal range and within 360 days    TSH  Date Value Ref Range Status  11/21/2019 1.420 0.450 - 4.500 uIU/mL Final          Passed - Valid encounter within last 12 months    Recent Outpatient Visits           1 month ago PMR (polymyalgia rheumatica) [facility])   [facility] Glean Hess, MD   3 months ago PMR (polymyalgia rheumatica) [facility])   [facility] Glean Hess, MD   5 months ago PMR (polymyalgia rheumatica) [facility])   [facility] Glean Hess, MD   6 months ago Benign hypertension   [place], [place]   7 months ago PMR (polymyalgia rheumatica) [facility])   [facility] Glean Hess, MD       Future Appointments             In 3 days Glean Hess, MD [facility], [facility]

## 2020-11-22 ENCOUNTER — Other Ambulatory Visit: Payer: Self-pay

## 2020-11-22 ENCOUNTER — Other Ambulatory Visit: Payer: Self-pay | Admitting: Internal Medicine

## 2020-11-22 ENCOUNTER — Encounter: Payer: Self-pay | Admitting: Internal Medicine

## 2020-11-22 ENCOUNTER — Ambulatory Visit (INDEPENDENT_AMBULATORY_CARE_PROVIDER_SITE_OTHER): Payer: Medicare PPO | Admitting: Internal Medicine

## 2020-11-22 VITALS — BP 106/64 | HR 61 | Temp 98.2°F | Ht 65.0 in | Wt 170.0 lb

## 2020-11-22 DIAGNOSIS — E039 Hypothyroidism, unspecified: Secondary | ICD-10-CM

## 2020-11-22 DIAGNOSIS — K219 Gastro-esophageal reflux disease without esophagitis: Secondary | ICD-10-CM | POA: Diagnosis not present

## 2020-11-22 DIAGNOSIS — E785 Hyperlipidemia, unspecified: Secondary | ICD-10-CM | POA: Diagnosis not present

## 2020-11-22 DIAGNOSIS — E118 Type 2 diabetes mellitus with unspecified complications: Secondary | ICD-10-CM | POA: Diagnosis not present

## 2020-11-22 DIAGNOSIS — Z13820 Encounter for screening for osteoporosis: Secondary | ICD-10-CM | POA: Diagnosis not present

## 2020-11-22 DIAGNOSIS — M353 Polymyalgia rheumatica: Secondary | ICD-10-CM

## 2020-11-22 DIAGNOSIS — Z1231 Encounter for screening mammogram for malignant neoplasm of breast: Secondary | ICD-10-CM

## 2020-11-22 DIAGNOSIS — R310 Gross hematuria: Secondary | ICD-10-CM

## 2020-11-22 DIAGNOSIS — I10 Essential (primary) hypertension: Secondary | ICD-10-CM | POA: Diagnosis not present

## 2020-11-22 DIAGNOSIS — Z0000 Encounter for general adult medical examination without abnormal findings: Secondary | ICD-10-CM

## 2020-11-22 DIAGNOSIS — F3341 Major depressive disorder, recurrent, in partial remission: Secondary | ICD-10-CM

## 2020-11-22 LAB — POCT URINALYSIS DIPSTICK
Bilirubin, UA: NEGATIVE
Blood, UA: NEGATIVE
Glucose, UA: NEGATIVE
Ketones, UA: NEGATIVE
Nitrite, UA: POSITIVE
Protein, UA: NEGATIVE
Spec Grav, UA: 1.015 (ref 1.010–1.025)
Urobilinogen, UA: 0.2 E.U./dL
pH, UA: 6 (ref 5.0–8.0)

## 2020-11-22 MED ORDER — PREDNISONE 5 MG PO TABS: 5.0000 mg | ORAL_TABLET | Freq: Every day | ORAL | 0 refills | Status: DC

## 2020-11-22 MED ORDER — SIMVASTATIN 20 MG PO TABS: ORAL_TABLET | ORAL | 3 refills | Status: DC

## 2020-11-22 NOTE — Progress Notes (Signed)
Date:  11/22/2020   Name:  Jill Dunlap   DOB:  [birth year]   MRN:  [id number]   Chief Complaint: Annual Exam (Breast exam no pap)  Jill Dunlap is a 86 y.o. female who presents today for her Complete Annual Exam. She feels well. She reports exercising walking X3 days a week.. She reports she is sleeping well. Breast complaints none.  Mammogram: 12/2019 DEXA: 10/2016 Osteopenia Colonoscopy: aged out - last done 2011  Immunization History  Administered Date(s) Administered  . Fluad Quad(high Dose 65+) 04/25/2020  . Influenza, High Dose Seasonal PF 04/27/2017, 04/28/2019  . Influenza-Unspecified 04/27/2015, 04/30/2018  . PFIZER(Purple Top)SARS-COV-2 Vaccination 07/27/2019, 08/17/2019, 03/30/2020  . Pneumococcal Conjugate-13 06/29/2014  . Pneumococcal Polysaccharide-23 10/30/2008  . Td 10/30/2008  . Zoster 10/30/2008  . Zoster Recombinat (Shingrix) 04/07/2018, 06/09/2018    Hypertension This is a chronic problem. The problem is controlled. Pertinent negatives include no chest pain, headaches, palpitations or shortness of breath. Past treatments include ACE inhibitors and diuretics. The current treatment provides significant improvement. There are no compliance problems.  Identifiable causes of hypertension include a thyroid problem.  Depression        This is a chronic problem.The problem is unchanged.  Associated symptoms include no fatigue and no headaches.  Past treatments include SSRIs - Selective serotonin reuptake inhibitors.  Compliance with treatment is good.  Past medical history includes thyroid problem.   Thyroid Problem Presents for follow-up visit. Patient reports no anxiety, constipation, diarrhea, fatigue, palpitations or tremors. The symptoms have been stable. Her past medical history is significant for hyperlipidemia.  Hyperlipidemia This is a chronic problem. The problem is controlled. Pertinent negatives include no chest pain or shortness of breath.  Current antihyperlipidemic treatment includes statins. The current treatment provides significant improvement of lipids. There are no compliance problems.   Diabetes She presents for her follow-up diabetic visit. She has type 2 diabetes mellitus. The initial diagnosis of diabetes was made 6 months (since starting steroids for PMR) ago. Her disease course has been stable. Pertinent negatives for hypoglycemia include no dizziness, headaches, nervousness/anxiousness or tremors. Pertinent negatives for diabetes include no chest pain, no fatigue, no polydipsia and no polyuria. Current diabetic treatment includes diet. An ACE inhibitor/angiotensin II receptor blocker is being taken.  PMR - last visit 2 months ago was doing well.  Prednisone decreased to 10 mg per day. She is doing very well with minor limitations due to other arthritis.    Lab Results  Component Value Date   CREATININE 1.00 05/09/2020   BUN 24 (H) 05/09/2020   NA 133 (L) 05/09/2020   K 3.8 05/09/2020   CL 94 (L) 05/09/2020   CO2 29 05/09/2020   Lab Results  Component Value Date   CHOL 168 11/21/2019   HDL 69 11/21/2019   LDLCALC 81 11/21/2019   TRIG 103 11/21/2019   CHOLHDL 2.4 11/21/2019   Lab Results  Component Value Date   TSH 1.420 11/21/2019   Lab Results  Component Value Date   HGBA1C 7.0 (H) 08/01/2020   Lab Results  Component Value Date   WBC 5.6 11/21/2019   HGB 13.4 11/21/2019   HCT 41.2 11/21/2019   MCV 90 11/21/2019   PLT 180 11/21/2019   Lab Results  Component Value Date   ALT 10 01/27/2020   AST 8 01/27/2020   ALKPHOS 52 01/27/2020   BILITOT 0.6 01/27/2020   Lab Results  Component Value Date   ESRSEDRATE 2 08/01/2020  Review of Systems  Constitutional: Negative for chills, fatigue and fever.  HENT: Negative for congestion, hearing loss, tinnitus, trouble swallowing and voice change.   Eyes: Negative for visual disturbance.  Respiratory: Negative for cough, chest tightness, shortness  of breath and wheezing.   Cardiovascular: Negative for chest pain, palpitations and leg swelling.  Gastrointestinal: Negative for abdominal pain, constipation, diarrhea and vomiting.  Endocrine: Negative for polydipsia and polyuria.  Genitourinary: Negative for dysuria, frequency, genital sores, vaginal bleeding and vaginal discharge.  Musculoskeletal: Positive for arthralgias (hip). Negative for gait problem and joint swelling.  Skin: Negative for color change and rash.  Neurological: Negative for dizziness, tremors, light-headedness and headaches.  Hematological: Negative for adenopathy. Does not bruise/bleed easily.  Psychiatric/Behavioral: Positive for depression. Negative for dysphoric mood and sleep disturbance. The patient is not nervous/anxious.     Patient Active Problem List   Diagnosis Date Noted  . Aortic atherosclerosis ([place]) 08/01/2020  . Disorder of bursae of shoulder region 02/02/2020  . PMR (polymyalgia rheumatica) (HCC) 02/02/2020  . Primary osteoarthritis of left hip 01/24/2020  . Hip pain, chronic, unspecified laterality 12/28/2019  . Recurrent major depression in partial remission ([place]) 12/28/2019  . Diffuse photodamage of skin 02/13/2016  . Basal cell carcinoma 01/11/2016  . Gastroesophageal reflux disease without esophagitis 07/03/2015  . Type II diabetes mellitus with complication ([place]) 60/63/0160  . Acquired hypothyroidism 10/31/2014  . Allergic rhinitis 10/31/2014  . Benign hypertension 10/31/2014  . CD (celiac disease) 10/31/2014  . Dyslipidemia 10/31/2014  . OP (osteoporosis) 10/31/2014    Allergies  Allergen Reactions  . Ciprofloxacin Hcl Rash    Past Surgical History:  Procedure Laterality Date  . ABDOMINAL HYSTERECTOMY  1984   for bleeding  . Basil cell    . BILATERAL SALPINGOOPHORECTOMY  1984  . CATARACT EXTRACTION W/PHACO Right 11/24/2016   Procedure: CATARACT EXTRACTION PHACO AND INTRAOCULAR LENS PLACEMENT ([place]) Right;  Surgeon: Leandrew Koyanagi, MD;  Location: [facility];  Service: Ophthalmology;  Laterality: Right;  . CATARACT EXTRACTION W/PHACO Left 02/04/2017   Procedure: CATARACT EXTRACTION PHACO AND INTRAOCULAR LENS PLACEMENT ([place])  Left;  Surgeon: Leandrew Koyanagi, MD;  Location: [facility];  Service: Ophthalmology;  Laterality: Left;  . ORIF WRIST FRACTURE Left 2015  . TYMPANOPLASTY  1992    Social History   Tobacco Use  . Smoking status: Never Smoker  . Smokeless tobacco: Never Used  . Tobacco comment: smoking cessation materials not required  Vaping Use  . Vaping Use: Never used  Substance Use Topics  . Alcohol use: No    Alcohol/week: 0.0 standard drinks  . Drug use: No     Medication list has been reviewed and updated.  Current Meds  Medication Sig  . azelastine (ASTELIN) 0.1 % nasal spray INSTILL 2 SPRAYS IN EACH NOSTRIL TWICE DAILY AS DIRECTED  . Biotin 2500 MCG CAPS Take 1 capsule by mouth daily.  Marland Kitchen CALCIUM PO Take by mouth daily.  . Cranberry 500 MG CAPS Take 1 capsule by mouth daily.   Marland Kitchen escitalopram (LEXAPRO) 10 MG tablet TAKE ONE (1) TABLET BY MOUTH ONCE DAILY  . fexofenadine (ALLEGRA) 180 MG tablet Take 1 tablet by mouth daily.  . fluticasone (FLONASE) 50 MCG/ACT nasal spray PLACE 2 SPRAYS INTO BOTH NOSTRILS DAILY.  Marland Kitchen Glucosamine-Chondroitin (MOVE FREE PO) Take by mouth daily.  Marland Kitchen levothyroxine (SYNTHROID) 50 MCG tablet TAKE (1) TABLET BY MOUTH DAILY BEFORE BREAKFAST.  Marland Kitchen lisinopril-hydrochlorothiazide (PRINZIDE,ZESTORETIC) 20-12.5 MG tablet Take 1 tablet by mouth daily.  Marland Kitchen  Multiple Vitamins-Minerals (CENTRUM SILVER PO) Take by mouth daily.  Marland Kitchen omeprazole (PRILOSEC) 20 MG capsule TAKE ONE (1) CAPSULE EACH DAY.  Marland Kitchen predniSONE (DELTASONE) 10 MG tablet Take 1 tablet (10 mg total) by mouth daily with breakfast.  . simvastatin (ZOCOR) 20 MG tablet TAKE ONE TABLET BY MOUTH DAILY AT 6PM.  . traMADol (ULTRAM) 50 MG tablet Take 0.5-1 tablets (25-50 mg total) by mouth every 8  (eight) hours as needed.  . TURMERIC PO Take 1 tablet by mouth daily.     PHQ 2/9 Scores 11/22/2020 10/01/2020 08/01/2020 06/20/2020  PHQ - 2 Score 1 0 0 0  PHQ- 9 Score 2 0 0 0    GAD 7 : Generalized Anxiety Score 11/22/2020 10/01/2020 08/01/2020 06/20/2020  Nervous, Anxious, on Edge 0 0 0 0  Control/stop worrying 0 0 0 0  Worry too much - different things 0 0 0 0  Trouble relaxing 0 0 0 0  Restless 0 0 0 0  Easily annoyed or irritable 0 0 0 0  Afraid - awful might happen 0 0 0 0  Total GAD 7 Score 0 0 0 0  Anxiety Difficulty Not difficult at all - - -    BP Readings from Last 3 Encounters:  11/22/20 106/64  08/01/20 128/64  05/09/20 134/72    Physical Exam Vitals and nursing note reviewed.  Constitutional:      General: She is not in acute distress.    Appearance: She is well-developed.  HENT:     Head: Normocephalic and atraumatic.     Right Ear: Tympanic membrane and ear canal normal. Decreased hearing noted.     Left Ear: Tympanic membrane and ear canal normal. Decreased hearing noted.     Nose:     Right Sinus: No maxillary sinus tenderness.     Left Sinus: No maxillary sinus tenderness.  Eyes:     General: No scleral icterus.       Right eye: No discharge.        Left eye: No discharge.     Conjunctiva/sclera: Conjunctivae normal.  Neck:     Thyroid: No thyromegaly.     Vascular: No carotid bruit.  Cardiovascular:     Rate and Rhythm: Normal rate and regular rhythm.     Pulses: Normal pulses.     Heart sounds: Normal heart sounds.  Pulmonary:     Effort: Pulmonary effort is normal. No respiratory distress.     Breath sounds: No wheezing.  Chest:  Breasts:     Right: No mass, nipple discharge, skin change or tenderness.     Left: No mass, nipple discharge, skin change or tenderness.    Abdominal:     General: Bowel sounds are normal.     Palpations: Abdomen is soft.     Tenderness: There is no abdominal tenderness.  Musculoskeletal:     Cervical back:  Normal range of motion. No erythema.     Right lower leg: No edema.     Left lower leg: No edema.  Lymphadenopathy:     Cervical: No cervical adenopathy.  Skin:    General: Skin is warm and dry.     Capillary Refill: Capillary refill takes less than 2 seconds.     Findings: No rash.  Neurological:     General: No focal deficit present.     Mental Status: She is alert and oriented to person, place, and time.     Cranial Nerves: No cranial nerve deficit.  Sensory: No sensory deficit.     Deep Tendon Reflexes: Reflexes are normal and symmetric.  Psychiatric:        Attention and Perception: Attention normal.        Mood and Affect: Mood normal.     Wt Readings from Last 3 Encounters:  11/22/20 170 lb (77.1 kg)  08/01/20 166 lb (75.3 kg)  05/09/20 162 lb (73.5 kg)    BP 106/64   Pulse 61   Temp 98.2 F (36.8 C) (Oral)   Ht 5' 5"  (1.651 m)   Wt 170 lb (77.1 kg)   SpO2 96%   BMI 28.29 kg/m   Assessment and Plan: 1. Annual physical exam Normal exam Continue healthy diet, exercise as able Immunizations are up to date  2. Encounter for screening mammogram for breast cancer - MM 3D SCREEN BREAST BILATERAL; Future  3. Benign hypertension Clinically stable exam with well controlled BP. Tolerating medications without side effects at this time. Pt to continue current regimen and low sodium diet; benefits of regular exercise as able discussed. - Comprehensive metabolic panel - POCT urinalysis dipstick - positive for leukocytes and bacteria so culture sent  4. Gastroesophageal reflux disease without esophagitis Symptoms well controlled on daily PPI No red flag signs such as weight loss, n/v, melena Will continue omeprazole - CBC with Differential/Platelet  5. Acquired hypothyroidism supplemented - TSH + free T4  6. Type II diabetes mellitus with complication (HCC) Due to steroid use -  - Comprehensive metabolic panel - Hemoglobin A1c - Microalbumin / creatinine  urine ratio  7. Dyslipidemia On appropriate statin therapy - Lipid panel - simvastatin (ZOCOR) 20 MG tablet; TAKE ONE TABLET BY MOUTH DAILY AT 6PM.  Dispense: 90 tablet; Refill: 3  8. PMR (polymyalgia rheumatica) (HCC) Doing well - will continue to taper prednisone - Sedimentation rate - predniSONE (DELTASONE) 5 MG tablet; Take 1 tablet (5 mg total) by mouth daily with breakfast.  Dispense: 90 tablet; Refill: 0  9. Recurrent major depression in partial remission (HCC) Clinically stable on current regimen with good control of symptoms, No SI or HI. Will continue current therapy.  10. Encounter for screening for osteoporosis - DG Bone Density; Future   Partially dictated using Editor, commissioning. Any errors are unintentional.  Halina Maidens, MD [facility]  11/22/2020

## 2020-11-22 NOTE — Patient Instructions (Addendum)
Decrease prednisone by 1 mg per month.  Start 9 mg now.  Referred for bone density - we will try to schedule this with your mammogram due next month.

## 2020-11-23 LAB — SEDIMENTATION RATE: Sed Rate: 7 mm/hr (ref 0–40)

## 2020-11-23 LAB — LIPID PANEL
Chol/HDL Ratio: 2.3 ratio (ref 0.0–4.4)
Cholesterol, Total: 175 mg/dL (ref 100–199)
HDL: 75 mg/dL (ref >39)
LDL Chol Calc (NIH): 84 mg/dL (ref 0–99)
Triglycerides: 89 mg/dL (ref 0–149)
VLDL Cholesterol Cal: 16 mg/dL (ref 5–40)

## 2020-11-23 LAB — MICROALBUMIN / CREATININE URINE RATIO
Creatinine, Urine: 75.6 mg/dL
Microalb/Creat Ratio: 8 mg/g creat (ref 0–29)
Microalbumin, Urine: 6.3 ug/mL

## 2020-11-23 LAB — CBC WITH DIFFERENTIAL/PLATELET
Basophils Absolute: 0 10*3/uL (ref 0.0–0.2)
Basos: 1 %
EOS (ABSOLUTE): 0.1 10*3/uL (ref 0.0–0.4)
Eos: 1 %
Hematocrit: 41.3 % (ref 34.0–46.6)
Hemoglobin: 13.8 g/dL (ref 11.1–15.9)
Immature Grans (Abs): 0 10*3/uL (ref 0.0–0.1)
Immature Granulocytes: 1 %
Lymphocytes Absolute: 1.8 10*3/uL (ref 0.7–3.1)
Lymphs: 22 %
MCH: 29.8 pg (ref 26.6–33.0)
MCHC: 33.4 g/dL (ref 31.5–35.7)
MCV: 89 fL (ref 79–97)
Monocytes Absolute: 0.8 10*3/uL (ref 0.1–0.9)
Monocytes: 10 %
Neutrophils Absolute: 5.5 10*3/uL (ref 1.4–7.0)
Neutrophils: 65 %
Platelets: 217 10*3/uL (ref 150–450)
RBC: 4.63 x10E6/uL (ref 3.77–5.28)
RDW: 13.3 % (ref 11.7–15.4)
WBC: 8.3 10*3/uL (ref 3.4–10.8)

## 2020-11-23 LAB — COMPREHENSIVE METABOLIC PANEL
ALT: 15 IU/L (ref 0–32)
AST: 17 IU/L (ref 0–40)
Albumin/Globulin Ratio: 1.8 (ref 1.2–2.2)
Albumin: 4.7 g/dL — ABNORMAL HIGH (ref 3.6–4.6)
Alkaline Phosphatase: 32 IU/L — ABNORMAL LOW (ref 44–121)
BUN/Creatinine Ratio: 15 (ref 12–28)
BUN: 14 mg/dL (ref 8–27)
Bilirubin Total: 0.5 mg/dL (ref 0.0–1.2)
CO2: 22 mmol/L (ref 20–29)
Calcium: 9.7 mg/dL (ref 8.7–10.3)
Chloride: 100 mmol/L (ref 96–106)
Creatinine, Ser: 0.95 mg/dL (ref 0.57–1.00)
Globulin, Total: 2.6 g/dL (ref 1.5–4.5)
Glucose: 118 mg/dL — ABNORMAL HIGH (ref 65–99)
Potassium: 4 mmol/L (ref 3.5–5.2)
Sodium: 138 mmol/L (ref 134–144)
Total Protein: 7.3 g/dL (ref 6.0–8.5)
eGFR: 59 mL/min/{1.73_m2} — ABNORMAL LOW (ref >59)

## 2020-11-23 LAB — HEMOGLOBIN A1C
Est. average glucose Bld gHb Est-mCnc: 151 mg/dL
Hgb A1c MFr Bld: 6.9 % — ABNORMAL HIGH (ref 4.8–5.6)

## 2020-11-23 LAB — TSH+FREE T4
Free T4: 1.16 ng/dL (ref 0.82–1.77)
TSH: 2.18 u[IU]/mL (ref 0.450–4.500)

## 2020-11-28 ENCOUNTER — Other Ambulatory Visit: Payer: Self-pay | Admitting: Internal Medicine

## 2020-11-28 DIAGNOSIS — B9620 Unspecified Escherichia coli [E. coli] as the cause of diseases classified elsewhere: Secondary | ICD-10-CM

## 2020-11-28 LAB — URINE CULTURE

## 2020-11-28 MED ORDER — SULFAMETHOXAZOLE-TRIMETHOPRIM 800-160 MG PO TABS: 1.0000 | ORAL_TABLET | Freq: Two times a day (BID) | ORAL | 0 refills | Status: AC

## 2020-12-04 ENCOUNTER — Other Ambulatory Visit: Payer: Self-pay

## 2020-12-04 ENCOUNTER — Ambulatory Visit: Payer: Medicare PPO | Attending: Internal Medicine

## 2020-12-04 DIAGNOSIS — Z23 Encounter for immunization: Secondary | ICD-10-CM

## 2020-12-04 MED ORDER — PFIZER-BIONT COVID-19 VAC-TRIS 30 MCG/0.3ML IM SUSP
INTRAMUSCULAR | 0 refills | Status: DC
  Filled 2020-12-04: qty 0.3, 1d supply, fill #0

## 2020-12-04 NOTE — Progress Notes (Signed)
   Covid-19 Vaccination Clinic  Name:  Jill Dunlap    MRN: [id number] DOB: Dec 15, 1934  12/04/2020  Jill Dunlap was observed post Covid-19 immunization for 15 minutes without incident. She was provided with Vaccine Information Sheet and instruction to access the V-Safe system.   Jill Dunlap was instructed to call 911 with any severe reactions post vaccine: Marland Kitchen Difficulty breathing  . Swelling of face and throat  . A fast heartbeat  . A bad rash all over body  . Dizziness and weakness   Immunizations Administered    Name Date Dose VIS Date Route   PFIZER Comrnaty(Gray TOP) Covid-19 Vaccine 12/04/2020 10:49 AM 0.3 mL 06/28/2020 Intramuscular   Manufacturer: [employer]   Lot: [id number]   NDC: [address]-4     Lu Duffel, PharmD, MBA Clinical Acute Care Pharmacist

## 2020-12-10 ENCOUNTER — Other Ambulatory Visit: Payer: Self-pay | Admitting: Internal Medicine

## 2020-12-20 ENCOUNTER — Other Ambulatory Visit: Payer: Medicare PPO

## 2020-12-20 ENCOUNTER — Ambulatory Visit: Payer: Medicare PPO

## 2021-01-03 ENCOUNTER — Encounter: Payer: Self-pay | Admitting: Internal Medicine

## 2021-01-04 ENCOUNTER — Ambulatory Visit: Payer: Medicare PPO | Admitting: Internal Medicine

## 2021-01-04 ENCOUNTER — Encounter: Payer: Self-pay | Admitting: Internal Medicine

## 2021-01-04 ENCOUNTER — Other Ambulatory Visit: Payer: Self-pay

## 2021-01-04 VITALS — BP 112/68 | HR 60 | Temp 98.2°F | Ht 65.0 in | Wt 172.0 lb

## 2021-01-04 DIAGNOSIS — J019 Acute sinusitis, unspecified: Secondary | ICD-10-CM

## 2021-01-04 DIAGNOSIS — M353 Polymyalgia rheumatica: Secondary | ICD-10-CM

## 2021-01-04 DIAGNOSIS — R059 Cough, unspecified: Secondary | ICD-10-CM | POA: Diagnosis not present

## 2021-01-04 MED ORDER — PROMETHAZINE-DM 6.25-15 MG/5ML PO SYRP: 5.0000 mL | ORAL_SOLUTION | Freq: Four times a day (QID) | ORAL | 0 refills | Status: DC | PRN

## 2021-01-04 MED ORDER — AZITHROMYCIN 250 MG PO TABS: ORAL_TABLET | ORAL | 0 refills | Status: AC

## 2021-01-04 NOTE — Progress Notes (Signed)
Date:  01/04/2021   Name:  Jill Dunlap   DOB:  14-Nov-1934   MRN:  [id number]   Chief Complaint: Cough (X2-3 days, Clear mucous, no fever, Irritated throat, head congestion) Tested negative at home this AM for Covid. Cough This is a new problem. The current episode started in the past 7 days. The problem has been unchanged. The problem occurs every few minutes. Cough characteristics: clear phlegm. Associated symptoms include headaches, postnasal drip and a sore throat. Pertinent negatives include no chills, fever or shortness of breath.  Sinusitis This is a new problem. The current episode started in the past 7 days. The problem has been gradually worsening since onset. There has been no fever. Associated symptoms include congestion, coughing, headaches, sinus pressure and a sore throat. Pertinent negatives include no chills or shortness of breath. Treatments tried: allegra. The treatment provided mild relief.  PMR - current prednisone dose 8 mg. Lab Results  Component Value Date   CREATININE 0.95 11/22/2020   BUN 14 11/22/2020   NA 138 11/22/2020   K 4.0 11/22/2020   CL 100 11/22/2020   CO2 22 11/22/2020   Lab Results  Component Value Date   CHOL 175 11/22/2020   HDL 75 11/22/2020   LDLCALC 84 11/22/2020   TRIG 89 11/22/2020   CHOLHDL 2.3 11/22/2020   Lab Results  Component Value Date   TSH 2.180 11/22/2020   Lab Results  Component Value Date   HGBA1C 6.9 (H) 11/22/2020   Lab Results  Component Value Date   WBC 8.3 11/22/2020   HGB 13.8 11/22/2020   HCT 41.3 11/22/2020   MCV 89 11/22/2020   PLT 217 11/22/2020   Lab Results  Component Value Date   ALT 15 11/22/2020   AST 17 11/22/2020   ALKPHOS 32 (L) 11/22/2020   BILITOT 0.5 11/22/2020   Lab Results  Component Value Date   ESRSEDRATE 7 11/22/2020      Review of Systems  Constitutional:  Negative for chills and fever.  HENT:  Positive for congestion, postnasal drip, sinus pressure and sore  throat.   Respiratory:  Positive for cough. Negative for shortness of breath.   Neurological:  Positive for headaches.   Patient Active Problem List   Diagnosis Date Noted   Aortic atherosclerosis ([place]) 08/01/2020   Disorder of bursae of shoulder region 02/02/2020   PMR (polymyalgia rheumatica) ([place]) 02/02/2020   Primary osteoarthritis of left hip 01/24/2020   Hip pain, chronic, unspecified laterality 12/28/2019   Recurrent major depression in partial remission ([place]) 12/28/2019   Diffuse photodamage of skin 02/13/2016   Basal cell carcinoma 01/11/2016   Gastroesophageal reflux disease without esophagitis 07/03/2015   Type II diabetes mellitus with complication ([place]) 19/41/7408   Acquired hypothyroidism 10/31/2014   Allergic rhinitis 10/31/2014   Benign hypertension 10/31/2014   CD (celiac disease) 10/31/2014   Dyslipidemia 10/31/2014   OP (osteoporosis) 10/31/2014    Allergies  Allergen Reactions   Ciprofloxacin Hcl Rash    Past Surgical History:  Procedure Laterality Date   ABDOMINAL HYSTERECTOMY  1984   for bleeding   Basil cell     BILATERAL SALPINGOOPHORECTOMY  1984   CATARACT EXTRACTION W/PHACO Right 11/24/2016   Procedure: CATARACT EXTRACTION PHACO AND INTRAOCULAR LENS PLACEMENT ([place]) Right;  Surgeon: Leandrew Koyanagi, MD;  Location: [facility];  Service: Ophthalmology;  Laterality: Right;   CATARACT EXTRACTION W/PHACO Left 02/04/2017   Procedure: CATARACT EXTRACTION PHACO AND INTRAOCULAR LENS PLACEMENT ([place])  Left;  Surgeon: Leandrew Koyanagi, MD;  Location: [facility];  Service: Ophthalmology;  Laterality: Left;   ORIF WRIST FRACTURE Left 2015   TYMPANOPLASTY  1992    Social History   Tobacco Use   Smoking status: Never   Smokeless tobacco: Never   Tobacco comments:    smoking cessation materials not required  Vaping Use   Vaping Use: Never used  Substance Use Topics   Alcohol use: No    Alcohol/week: 0.0 standard drinks   Drug use:  No     Medication list has been reviewed and updated.  Current Meds  Medication Sig   azelastine (ASTELIN) 0.1 % nasal spray INSTILL 2 SPRAYS IN EACH NOSTRIL TWICE DAILY AS DIRECTED   Biotin 2500 MCG CAPS Take 1 capsule by mouth daily.   CALCIUM PO Take by mouth daily.   Cranberry 500 MG CAPS Take 1 capsule by mouth daily.    escitalopram (LEXAPRO) 10 MG tablet TAKE ONE (1) TABLET BY MOUTH ONCE DAILY   fexofenadine (ALLEGRA) 180 MG tablet Take 1 tablet by mouth daily.   fluticasone (FLONASE) 50 MCG/ACT nasal spray PLACE 2 SPRAYS INTO BOTH NOSTRILS DAILY.   Glucosamine-Chondroitin (MOVE FREE PO) Take by mouth daily.   levothyroxine (SYNTHROID) 50 MCG tablet TAKE (1) TABLET BY MOUTH DAILY BEFORE BREAKFAST.   lisinopril-hydrochlorothiazide (PRINZIDE,ZESTORETIC) 20-12.5 MG tablet Take 1 tablet by mouth daily.   Multiple Vitamins-Minerals (CENTRUM SILVER PO) Take by mouth daily.   omeprazole (PRILOSEC) 20 MG capsule TAKE ONE (1) CAPSULE EACH DAY.   predniSONE (DELTASONE) 1 MG tablet Take 4 tablets (4 mg total) by mouth daily with breakfast.   predniSONE (DELTASONE) 5 MG tablet Take 1 tablet (5 mg total) by mouth daily with breakfast.   simvastatin (ZOCOR) 20 MG tablet TAKE ONE TABLET BY MOUTH DAILY AT 6PM.   TURMERIC PO Take 1 tablet by mouth daily.     PHQ 2/9 Scores 01/04/2021 11/22/2020 10/01/2020 08/01/2020  PHQ - 2 Score 0 1 0 0  PHQ- 9 Score 0 2 0 0    GAD 7 : Generalized Anxiety Score 01/04/2021 11/22/2020 10/01/2020 08/01/2020  Nervous, Anxious, on Edge 0 0 0 0  Control/stop worrying 0 0 0 0  Worry too much - different things 0 0 0 0  Trouble relaxing 0 0 0 0  Restless 0 0 0 0  Easily annoyed or irritable 0 0 0 0  Afraid - awful might happen 0 0 0 0  Total GAD 7 Score 0 0 0 0  Anxiety Difficulty - Not difficult at all - -    BP Readings from Last 3 Encounters:  01/04/21 112/68  11/22/20 106/64  08/01/20 128/64    Physical Exam Constitutional:      Appearance: Normal  appearance. She is well-developed.  HENT:     Right Ear: Ear canal and external ear normal. Tympanic membrane is not erythematous or retracted.     Left Ear: Ear canal and external ear normal. Tympanic membrane is not erythematous or retracted.     Nose:     Right Sinus: Maxillary sinus tenderness and frontal sinus tenderness present.     Left Sinus: Maxillary sinus tenderness and frontal sinus tenderness present.     Mouth/Throat:     Mouth: No oral lesions.     Pharynx: Uvula midline. Posterior oropharyngeal erythema present. No oropharyngeal exudate.  Cardiovascular:     Rate and Rhythm: Normal rate and regular rhythm.     Heart sounds: Normal heart  sounds.  Pulmonary:     Effort: Pulmonary effort is normal.     Breath sounds: Examination of the right-middle field reveals wheezing. Wheezing present. No rales.  Musculoskeletal:     Cervical back: Normal range of motion.  Lymphadenopathy:     Cervical: No cervical adenopathy.  Neurological:     Mental Status: She is alert and oriented to person, place, and time.    Wt Readings from Last 3 Encounters:  01/04/21 172 lb (78 kg)  11/22/20 170 lb (77.1 kg)  08/01/20 166 lb (75.3 kg)    BP 112/68   Pulse 60   Temp 98.2 F (36.8 C) (Oral)   Ht 5' 5"  (1.651 m)   Wt 172 lb (78 kg)   SpO2 94%   BMI 28.62 kg/m   Assessment and Plan: 1. Acute non-recurrent sinusitis, unspecified location Continue Allegra - azithromycin (ZITHROMAX Z-PAK) 250 MG tablet; UAD  Dispense: 6 each; Refill: 0  2. PMR (polymyalgia rheumatica) (HCC) Continue slow prednisone taper  3. Cough - promethazine-dextromethorphan (PROMETHAZINE-DM) 6.25-15 MG/5ML syrup; Take 5 mLs by mouth 4 (four) times daily as needed for cough.  Dispense: 118 mL; Refill: 0   Partially dictated using Editor, commissioning. Any errors are unintentional.  Halina Maidens, MD [facility]  01/04/2021

## 2021-01-14 ENCOUNTER — Other Ambulatory Visit: Payer: Self-pay | Admitting: Internal Medicine

## 2021-01-14 DIAGNOSIS — K219 Gastro-esophageal reflux disease without esophagitis: Secondary | ICD-10-CM

## 2021-01-18 DIAGNOSIS — Z0100 Encounter for examination of eyes and vision without abnormal findings: Secondary | ICD-10-CM | POA: Diagnosis not present

## 2021-01-18 DIAGNOSIS — E119 Type 2 diabetes mellitus without complications: Secondary | ICD-10-CM | POA: Diagnosis not present

## 2021-01-18 LAB — HM DIABETES EYE EXAM

## 2021-01-22 ENCOUNTER — Ambulatory Visit
Admission: RE | Admit: 2021-01-22 | Discharge: 2021-01-22 | Disposition: A | Discharge status: Home / Self Care | Payer: Medicare PPO | Source: Ambulatory Visit | Attending: Internal Medicine | Admitting: Internal Medicine

## 2021-01-22 ENCOUNTER — Other Ambulatory Visit: Payer: Self-pay

## 2021-01-22 DIAGNOSIS — M85852 Other specified disorders of bone density and structure, left thigh: Secondary | ICD-10-CM | POA: Diagnosis not present

## 2021-01-22 DIAGNOSIS — E119 Type 2 diabetes mellitus without complications: Secondary | ICD-10-CM | POA: Diagnosis not present

## 2021-01-22 DIAGNOSIS — Z1231 Encounter for screening mammogram for malignant neoplasm of breast: Secondary | ICD-10-CM | POA: Insufficient documentation

## 2021-01-22 DIAGNOSIS — Z780 Asymptomatic menopausal state: Secondary | ICD-10-CM | POA: Insufficient documentation

## 2021-01-22 DIAGNOSIS — Z13820 Encounter for screening for osteoporosis: Secondary | ICD-10-CM | POA: Diagnosis not present

## 2021-01-24 ENCOUNTER — Encounter: Payer: Self-pay | Admitting: Internal Medicine

## 2021-02-11 ENCOUNTER — Encounter: Payer: Self-pay | Admitting: Internal Medicine

## 2021-02-12 ENCOUNTER — Inpatient Hospital Stay: Payer: Self-pay | Admitting: Radiology

## 2021-02-12 ENCOUNTER — Other Ambulatory Visit: Payer: Self-pay

## 2021-02-12 ENCOUNTER — Encounter: Payer: Self-pay | Admitting: Family Medicine

## 2021-02-12 ENCOUNTER — Ambulatory Visit: Payer: Medicare PPO | Admitting: Family Medicine

## 2021-02-12 VITALS — BP 124/68 | HR 61 | Temp 98.3°F | Ht 65.0 in | Wt 174.0 lb

## 2021-02-12 DIAGNOSIS — G5702 Lesion of sciatic nerve, left lower limb: Secondary | ICD-10-CM

## 2021-02-12 HISTORY — DX: Lesion of sciatic nerve, left lower limb: G57.02

## 2021-02-12 MED ORDER — TRIAMCINOLONE ACETONIDE 40 MG/ML IJ SUSP
120.0000 mg | Freq: Once | INTRAMUSCULAR | Status: AC
  Administered 2021-02-12: 120 mg

## 2021-02-12 NOTE — Progress Notes (Signed)
Primary Care / Sports Medicine Office Visit  Patient Information:  Patient ID: Jill Dunlap, female DOB: [birth year] Age: 86 y.o. MRN: [id number]   Jill Dunlap is a pleasant 86 y.o. female presenting with the following:  Chief Complaint  Patient presents with   New Patient (Initial Visit)   Hip Pain    Left; started after DXA scan 01/22/21, which showed mild bone thinning at the hip;  no known injury; X-Ray 08/01/20; hurts to sit, walk, and lay down to sleep;  started in the lateral left leg near the ankle and radiated to the left hip per patient; 7/10 pain   Leg Pain    Left; started after DXA scan 01/22/21;  bilateral knee X-rays 06/20/2020, but no pain with knees today; history of fall last year; relieved some by heat, massage, [place] and Tylenol;  unable to walk long distances; 7/10 pain    Review of Systems pertinent details above   Patient Active Problem List   Diagnosis Date Noted   Piriformis syndrome, left 02/12/2021   Aortic atherosclerosis ([place]) 08/01/2020   Disorder of bursae of shoulder region 02/02/2020   PMR (polymyalgia rheumatica) ([place]) 02/02/2020   Primary osteoarthritis of left hip 01/24/2020   Hip pain, chronic, unspecified laterality 12/28/2019   Recurrent major depression in partial remission ([place]) 12/28/2019   Diffuse photodamage of skin 02/13/2016   Basal cell carcinoma 01/11/2016   Gastroesophageal reflux disease without esophagitis 07/03/2015   Type II diabetes mellitus with complication ([place]) 62/86/3817   Acquired hypothyroidism 10/31/2014   Allergic rhinitis 10/31/2014   Benign hypertension 10/31/2014   CD (celiac disease) 10/31/2014   Dyslipidemia 10/31/2014   OP (osteoporosis) 10/31/2014   Past Medical History:  Diagnosis Date   Acid reflux    Allergy    Cancer ([place])    skin ca on leg head and back   Celiac disease    Current moderate episode of major depressive disorder without prior episode ([place])    Gastroesophageal  reflux disease without esophagitis    Hyperlipidemia    Hypertension    Hypothyroidism    OP (osteoporosis)    Piriformis syndrome, left 02/12/2021   Pre-diabetes    Primary osteoarthritis of left hip    Type II diabetes mellitus with complication (HCC)    Vertigo    no episodes in several years   Wears dentures    partial lower   Outpatient Encounter Medications as of 02/12/2021  Medication Sig   azelastine (ASTELIN) 0.1 % nasal spray INSTILL 2 SPRAYS IN EACH NOSTRIL TWICE DAILY AS DIRECTED   Biotin 2500 MCG CAPS Take 1 capsule by mouth daily.   CALCIUM PO Take 1 tablet by mouth daily.   Cranberry 500 MG CAPS Take 1 capsule by mouth daily.    escitalopram (LEXAPRO) 10 MG tablet TAKE ONE (1) TABLET BY MOUTH ONCE DAILY   fexofenadine (ALLEGRA) 180 MG tablet Take 1 tablet by mouth daily.   fluticasone (FLONASE) 50 MCG/ACT nasal spray PLACE 2 SPRAYS INTO BOTH NOSTRILS DAILY.   Glucosamine-Chondroitin (MOVE FREE PO) Take by mouth daily.   levothyroxine (SYNTHROID) 50 MCG tablet TAKE (1) TABLET BY MOUTH DAILY BEFORE BREAKFAST.   lisinopril-hydrochlorothiazide (PRINZIDE,ZESTORETIC) 20-12.5 MG tablet Take 1 tablet by mouth daily.   Multiple Vitamins-Minerals (CENTRUM SILVER PO) Take 1 tablet by mouth daily.   omeprazole (PRILOSEC) 20 MG capsule TAKE ONE (1) CAPSULE EACH DAY.   predniSONE (DELTASONE) 1 MG tablet Take 4 tablets (4 mg total)  by mouth daily with breakfast.   predniSONE (DELTASONE) 5 MG tablet Take 1 tablet (5 mg total) by mouth daily with breakfast.   promethazine-dextromethorphan (PROMETHAZINE-DM) 6.25-15 MG/5ML syrup Take 5 mLs by mouth 4 (four) times daily as needed for cough.   simvastatin (ZOCOR) 20 MG tablet TAKE ONE TABLET BY MOUTH DAILY AT 6PM.   TURMERIC PO Take 1 tablet by mouth daily.    [DISCONTINUED] COVID-19 mRNA Vac-TriS, Pfizer, (PFIZER-BIONT COVID-19 VAC-TRIS) SUSP injection Inject into the muscle.   [DISCONTINUED] Omeprazole (PRILOSEC PO) Take 1 tablet by  mouth.   No facility-administered encounter medications on file as of 02/12/2021.   Past Surgical History:  Procedure Laterality Date   ABDOMINAL HYSTERECTOMY  1984   for bleeding   Basil cell     BILATERAL SALPINGOOPHORECTOMY  1984   CATARACT EXTRACTION W/PHACO Right 11/24/2016   Procedure: CATARACT EXTRACTION PHACO AND INTRAOCULAR LENS PLACEMENT ([place]) Right;  Surgeon: Leandrew Koyanagi, MD;  Location: [facility];  Service: Ophthalmology;  Laterality: Right;   CATARACT EXTRACTION W/PHACO Left 02/04/2017   Procedure: CATARACT EXTRACTION PHACO AND INTRAOCULAR LENS PLACEMENT ([place])  Left;  Surgeon: Leandrew Koyanagi, MD;  Location: [facility];  Service: Ophthalmology;  Laterality: Left;   ORIF WRIST FRACTURE Left 2015   TYMPANOPLASTY  1992    Vitals:   02/12/21 1352  BP: 124/68  Pulse: 61  Temp: 98.3 F (36.8 C)  SpO2: 96%   Vitals:   02/12/21 1352  Weight: 174 lb (78.9 kg)  Height: 5' 5"  (1.651 m)   Body mass index is 28.96 kg/m.     Independent interpretation of notes and tests performed by another provider:   Independent interpretation of left hip and pelvis x-ray dated 08/01/2020 reveals moderate degenerative changes at the left hip, mild to Moderate at the Right Hip, Left Inferior SI Joint Narrowing, lumbar spine is not fully visualized though areas seen are noted to have degenerative changes, no acute osseous process identified  Procedures performed:   Procedures:  Perineural injection of sciatic nerve at piriformis junction and 2 separate piriformis trigger point injections under ultrasound guidance (3 injections total). Ultrasound guidance utilized for visualization of piriformis at sciatic nerve, noted hypoechoic response from injectate Samsung HS60 device utilized with permanent recording / reporting. Consent obtained and verified. Skin prepped in a sterile fashion. Ethyl chloride spray for topical local analgesia.  Completed without  difficulty and tolerated well. Medication: triamcinolone acetonide 40 mg/mL suspension for injection 3 mL total and 6 mL lidocaine 1% without epinephrine utilized for needle placement anesthetic Advised to contact for fevers/chills, erythema, induration, drainage, or persistent bleeding.   Pertinent History, Exam, Impression, and Recommendations:   Piriformis syndrome, left Patient with left posterior hip pain with radiation to her posterolateral left lower leg towards the ankle, atraumatic onset, noted after positioning for recent DEXA scan earlier this month.  Pain described as "shooting", aggravated with time on feet/weightbearing, mildly alleviated with acetaminophen dosed on a as needed basis.  Has mild low back pain, denies any knee pain, no overt weakness, no saddle involvement or bowel/bladder dysfunction.  Physical exam reveals significantly tight hip structures left greater than right, positive piriformis stretching on the left, direct pressure to the piriformis recreates symptoms, negative straight leg raise bilaterally, mild tenderness at the left greater than right sacroiliac joint, neurovascular intact distally-this is symmetric.  Clinical features are most consistent with left sided piriformis syndrome, given her ongoing dosing of prednisone, I did discuss ultrasound-guided injection at the piriformis  junction with the sciatic nerve, she is amenable to this.  Procedure performed, postinjection care outlined.  Home-based rehabilitation exercises provided, she is to initiate these this weekend and focus on graded to advance using symptoms as a guide.  She can follow-up on as-needed basis.   Orders & Medications No orders of the defined types were placed in this encounter.  Orders Placed This Encounter  Procedures   [place] LIMITED JOINT SPACE STRUCTURES LOW LEFT     Return if symptoms worsen or fail to improve.     [name], MD   Primary Care Sports Medicine [place]

## 2021-02-12 NOTE — Patient Instructions (Signed)
You have just been given a cortisone injection to reduce pain and inflammation. After the injection you may notice immediate relief of pain as a result of the Lidocaine. It is important to rest the area of the injection for 24 to 48 hours after the injection. There is a possibility of some temporary increased discomfort and swelling for up to 72 hours until the cortisone begins to work. If you do have pain, simply rest the joint and use ice. If you can tolerate over the counter medications, you can try Tylenol, Aleve, or Advil for added relief per package instructions. - Can use ice, heat, Tylenol - Start home exercises this weekend and continue x 4 weeks - Can contact us for physical therapy referral if needed - Follow-up as-needed

## 2021-02-12 NOTE — Assessment & Plan Note (Signed)
Patient with left posterior hip pain with radiation to her posterolateral left lower leg towards the ankle, atraumatic onset, noted after positioning for recent DEXA scan earlier this month.  Pain described as "shooting", aggravated with time on feet/weightbearing, mildly alleviated with acetaminophen dosed on a as needed basis.  Has mild low back pain, denies any knee pain, no overt weakness, no saddle involvement or bowel/bladder dysfunction.  Physical exam reveals significantly tight hip structures left greater than right, positive piriformis stretching on the left, direct pressure to the piriformis recreates symptoms, negative straight leg raise bilaterally, mild tenderness at the left greater than right sacroiliac joint, neurovascular intact distally-this is symmetric.  Clinical features are most consistent with left sided piriformis syndrome, given her ongoing dosing of prednisone, I did discuss ultrasound-guided injection at the piriformis junction with the sciatic nerve, she is amenable to this.  Procedure performed, postinjection care outlined.  Home-based rehabilitation exercises provided, she is to initiate these this weekend and focus on graded to advance using symptoms as a guide.  She can follow-up on as-needed basis.

## 2021-02-12 NOTE — Addendum Note (Signed)
Addended by: Forbes Cellar on: 02/12/2021 04:23 PM   Modules accepted: Orders

## 2021-02-21 ENCOUNTER — Other Ambulatory Visit: Payer: Self-pay | Admitting: Internal Medicine

## 2021-03-10 ENCOUNTER — Encounter: Payer: Self-pay | Admitting: Internal Medicine

## 2021-03-11 ENCOUNTER — Ambulatory Visit: Payer: Self-pay | Admitting: *Deleted

## 2021-03-11 NOTE — Telephone Encounter (Signed)
Pt left MyChart message, calling today to schedule with Dr. Zigmund Daniel.   Reports left hip pain "Same as it was before my injection."  States new onset "Tingling left lower leg and foot, for one week." Reports left foot numb this AM "For about 5 minutes, wiggled toes, moved foot around and numbness went away." Has not reoccurred this AM. Also reports "Around toes looked bluish at that time." Not presently. States tingling comes and goes. Agent had scheduled appt for Wednesday prior to triage. Pt requesting to be seen sooner. Assured pt NT would route to practice for PCPs review and final disposition. Advised ED for worsening symptoms. Please advise: [phone]

## 2021-03-11 NOTE — Telephone Encounter (Signed)
Reason for Disposition . [1] MODERATE pain (e.g., interferes with normal activities, limping) AND [2] present > 3 days  Answer Assessment - Initial Assessment Questions 1. LOCATION and RADIATION: "Where is the pain located?"      Left hip, please see summary. 2. QUALITY: "What does the pain feel like?"  (e.g., sharp, dull, aching, burning)     *No Answer* 3. SEVERITY: "How bad is the pain?" "What does it keep you from doing?"   (Scale 1-10; or mild, moderate, severe)   -  MILD (1-3): doesn't interfere with normal activities    -  MODERATE (4-7): interferes with normal activities (e.g., work or school) or awakens from sleep, limping    -  SEVERE (8-10): excruciating pain, unable to do any normal activities, unable to walk     *No Answer* 4. ONSET: "When did the pain start?" "Does it come and go, or is it there all the time?"     *No Answer* 5. WORK OR EXERCISE: "Has there been any recent work or exercise that involved this part of the body?"      *No Answer* 6. CAUSE: "What do you think is causing the hip pain?"      *No Answer* 7. AGGRAVATING FACTORS: "What makes the hip pain worse?" (e.g., walking, climbing stairs, running)     *No Answer* 8. OTHER SYMPTOMS: "Do you have any other symptoms?" (e.g., back pain, pain shooting down leg,  fever, rash)     *No Answer*  Protocols used: Hip Pain-A-AH

## 2021-03-11 NOTE — Telephone Encounter (Signed)
Pt has an appt 03/13/21.  KP

## 2021-03-13 ENCOUNTER — Other Ambulatory Visit: Payer: Self-pay

## 2021-03-13 ENCOUNTER — Ambulatory Visit
Admission: RE | Admit: 2021-03-13 | Discharge: 2021-03-13 | Disposition: A | Discharge status: Home / Self Care | Payer: Medicare PPO | Source: Ambulatory Visit | Attending: Family Medicine | Admitting: Family Medicine

## 2021-03-13 ENCOUNTER — Ambulatory Visit
Admission: RE | Admit: 2021-03-13 | Discharge: 2021-03-13 | Disposition: A | Discharge status: Home / Self Care | Payer: Medicare PPO | Attending: Family Medicine | Admitting: Family Medicine

## 2021-03-13 ENCOUNTER — Ambulatory Visit: Payer: Medicare PPO | Admitting: Family Medicine

## 2021-03-13 ENCOUNTER — Encounter: Payer: Self-pay | Admitting: Family Medicine

## 2021-03-13 VITALS — BP 136/72 | HR 59 | Temp 97.8°F | Ht 65.0 in | Wt 176.0 lb

## 2021-03-13 DIAGNOSIS — G5702 Lesion of sciatic nerve, left lower limb: Secondary | ICD-10-CM

## 2021-03-13 DIAGNOSIS — M5416 Radiculopathy, lumbar region: Secondary | ICD-10-CM | POA: Insufficient documentation

## 2021-03-13 DIAGNOSIS — M545 Low back pain, unspecified: Secondary | ICD-10-CM | POA: Diagnosis not present

## 2021-03-13 MED ORDER — ACETAMINOPHEN-CODEINE #3 300-30 MG PO TABS: 1.0000 | ORAL_TABLET | Freq: Four times a day (QID) | ORAL | 0 refills | Status: AC | PRN

## 2021-03-13 MED ORDER — PREDNISONE 50 MG PO TABS: 50.0000 mg | ORAL_TABLET | Freq: Every day | ORAL | 0 refills | Status: DC

## 2021-03-13 MED ORDER — GABAPENTIN 100 MG PO CAPS: ORAL_CAPSULE | ORAL | 11 refills | Status: DC

## 2021-03-13 NOTE — Progress Notes (Signed)
Primary Care / Sports Medicine Office Visit  Patient Information:  Patient ID: Jill Dunlap, female DOB: 10/13/1934 Age: 86 y.o. MRN: [id number]   Jill Dunlap is a pleasant 86 y.o. female presenting with the following:  Chief Complaint  Patient presents with   Piriformis syndrome, left    Injection was only helpful for a few days per patient; does some home exercises with no relief, could consider formal PT; new numbness and tingling in left leg and foot; using Sal[place] Pas and Tylenol [phone] mg as needed with no relief; 9/10 pain    Review of Systems pertinent details above   Patient Active Problem List   Diagnosis Date Noted   Left lumbar radiculopathy 03/13/2021   Piriformis syndrome, left 02/12/2021   Aortic atherosclerosis ([place]) 08/01/2020   Disorder of bursae of shoulder region 02/02/2020   PMR (polymyalgia rheumatica) ([place]) 02/02/2020   Primary osteoarthritis of left hip 01/24/2020   Hip pain, chronic, unspecified laterality 12/28/2019   Recurrent major depression in partial remission ([place]) 12/28/2019   Diffuse photodamage of skin 02/13/2016   Basal cell carcinoma 01/11/2016   Gastroesophageal reflux disease without esophagitis 07/03/2015   Type II diabetes mellitus with complication ([place]) 67/20/9470   Acquired hypothyroidism 10/31/2014   Allergic rhinitis 10/31/2014   Benign hypertension 10/31/2014   CD (celiac disease) 10/31/2014   Dyslipidemia 10/31/2014   OP (osteoporosis) 10/31/2014   Past Medical History:  Diagnosis Date   Acid reflux    Allergy    Cancer ([place])    skin ca on leg head and back   Celiac disease    Current moderate episode of major depressive disorder without prior episode (HCC)    Gastroesophageal reflux disease without esophagitis    Hyperlipidemia    Hypertension    Hypothyroidism    OP (osteoporosis)    Piriformis syndrome, left 02/12/2021   Pre-diabetes    Primary osteoarthritis of left hip    Type II diabetes  mellitus with complication (HCC)    Vertigo    no episodes in several years   Wears dentures    partial lower   Outpatient Encounter Medications as of 03/13/2021  Medication Sig   acetaminophen-codeine (TYLENOL #3) 300-30 MG tablet Take 1 tablet by mouth every 6 (six) hours as needed for up to 5 days for moderate pain.   azelastine (ASTELIN) 0.1 % nasal spray INSTILL 2 SPRAYS IN EACH NOSTRIL TWICE DAILY AS DIRECTED   Biotin 2500 MCG CAPS Take 1 capsule by mouth daily.   calcium-vitamin D (OSCAL WITH D) 500-200 MG-UNIT tablet Take 1 tablet by mouth 2 (two) times daily.   Cranberry 500 MG CAPS Take 1 capsule by mouth daily.    escitalopram (LEXAPRO) 10 MG tablet TAKE ONE (1) TABLET BY MOUTH ONCE DAILY   fexofenadine (ALLEGRA) 180 MG tablet Take 1 tablet by mouth daily.   fluticasone (FLONASE) 50 MCG/ACT nasal spray PLACE 2 SPRAYS INTO BOTH NOSTRILS DAILY.   gabapentin (NEURONTIN) 100 MG capsule One tab PO qHS for a week, then BID for a week, then TID.   Glucosamine-Chondroitin (MOVE FREE PO) Take 1 capsule by mouth in the morning and at bedtime.   levothyroxine (SYNTHROID) 50 MCG tablet TAKE (1) TABLET BY MOUTH DAILY BEFORE BREAKFAST.   lisinopril-hydrochlorothiazide (PRINZIDE,ZESTORETIC) 20-12.5 MG tablet Take 1 tablet by mouth daily.   Multiple Vitamins-Minerals (CENTRUM SILVER PO) Take 1 tablet by mouth daily.   omeprazole (PRILOSEC) 20 MG capsule TAKE ONE (1) CAPSULE EACH  DAY.   predniSONE (DELTASONE) 1 MG tablet Take 4 tablets (4 mg total) by mouth daily with breakfast.   predniSONE (DELTASONE) 5 MG tablet Take 1 tablet (5 mg total) by mouth daily with breakfast.   predniSONE (DELTASONE) 50 MG tablet Take 1 tablet (50 mg total) by mouth daily.   simvastatin (ZOCOR) 20 MG tablet TAKE ONE TABLET BY MOUTH DAILY AT 6PM.   TURMERIC PO Take 1 tablet by mouth daily.    [DISCONTINUED] CALCIUM PO Take 1 tablet by mouth daily.   [DISCONTINUED] Omeprazole (PRILOSEC PO) Take 1 tablet by mouth.    [DISCONTINUED] promethazine-dextromethorphan (PROMETHAZINE-DM) 6.25-15 MG/5ML syrup Take 5 mLs by mouth 4 (four) times daily as needed for cough.   No facility-administered encounter medications on file as of 03/13/2021.   Past Surgical History:  Procedure Laterality Date   ABDOMINAL HYSTERECTOMY  1984   for bleeding   Basil cell     BILATERAL SALPINGOOPHORECTOMY  1984   CATARACT EXTRACTION W/PHACO Right 11/24/2016   Procedure: CATARACT EXTRACTION PHACO AND INTRAOCULAR LENS PLACEMENT ([place]) Right;  Surgeon: Leandrew Koyanagi, MD;  Location: [facility];  Service: Ophthalmology;  Laterality: Right;   CATARACT EXTRACTION W/PHACO Left 02/04/2017   Procedure: CATARACT EXTRACTION PHACO AND INTRAOCULAR LENS PLACEMENT ([place])  Left;  Surgeon: Leandrew Koyanagi, MD;  Location: [facility];  Service: Ophthalmology;  Laterality: Left;   ORIF WRIST FRACTURE Left 2015   TYMPANOPLASTY  1992    Vitals:   03/13/21 0757  BP: 136/72  Pulse: (!) 59  Temp: 97.8 F (36.6 C)  SpO2: 96%   Vitals:   03/13/21 0757  Weight: 176 lb (79.8 kg)  Height: 5' 5"  (1.651 m)   Body mass index is 29.29 kg/m.  [place] LIMITED JOINT SPACE STRUCTURES LOW LEFT  Result Date: 02/12/2021 Procedures:  Perineural injection of sciatic nerve at piriformis junction and 2 separate piriformis trigger point injections under ultrasound guidance (3 injections total). Ultrasound guidance utilized for visualization of piriformis at sciatic nerve, noted hypoechoic response from injectate Samsung HS60 device utilized with permanent recording / reporting. Consent obtained and verified. Skin prepped in a sterile fashion. Ethyl chloride spray for topical local analgesia. Completed without difficulty and tolerated well. Medication: triamcinolone acetonide 40 mg/mL suspension for injection 3 mL total and 6 mL lidocaine 1% without epinephrine utilized for needle placement anesthetic Advised to contact for fevers/chills, erythema,  induration, drainage, or persistent bleeding.     Independent interpretation of notes and tests performed by another provider:   None  Procedures performed:   None  Pertinent History, Exam, Impression, and Recommendations:   Left lumbar radiculopathy Following left piriformis/static injection roughly 1 month prior, patient endorsed a few days of improvement followed by recurrence of generalized left greater than right hip/gluteal pain and newly noted left lower extremity tingling sensations, is noting stiffness, pain with ambulation, no new onset bowel/bladder dysfunction.  Physical examination reveals diminished though persistent tenderness at the piriformis, piriformis stretch benign, positive left straight leg raise, positive lumbar facet load, sensorimotor otherwise intact.  Her overall clinical course and findings are most consistent with underlying primary chronic lumbar etiology with compensatory piriformis involvement which she presented with.  Recent injection appears to have shown limited relief, plan for lumbar x-rays, initiation of gabapentin with instructions for titration, continued as needed acetaminophen dosing, I prescribed Tylenol 3 for severe breakthrough pain that she can dose sparingly, additionally hold on her usual prednisone, and placed a 50 mg daily burst course for 5  days was prescribed.  She will start physical therapy with focus on pain control, and a referral to pain and spine for additional management options was ordered. Form for a handicap placard was provided.  Patient is understanding of the above plan, questions were answered, we will coordinate a follow-up after she has seen pain and spine.  We will follow peripherally on this issue.  Piriformis syndrome, left See additional assessment(s) for plan details.   Orders & Medications Meds ordered this encounter  Medications   predniSONE (DELTASONE) 50 MG tablet    Sig: Take 1 tablet (50 mg total) by mouth  daily.    Dispense:  5 tablet    Refill:  0   gabapentin (NEURONTIN) 100 MG capsule    Sig: One tab PO qHS for a week, then BID for a week, then TID.    Dispense:  90 capsule    Refill:  11   acetaminophen-codeine (TYLENOL #3) 300-30 MG tablet    Sig: Take 1 tablet by mouth every 6 (six) hours as needed for up to 5 days for moderate pain.    Dispense:  20 tablet    Refill:  0   Orders Placed This Encounter  Procedures   DG Lumbar Spine Complete   Ambulatory referral to Physical Therapy   Ambulatory referral to Anesthesiology     Return if symptoms worsen or fail to improve.     [name], MD   Primary Care Sports Medicine [place]

## 2021-03-13 NOTE — Assessment & Plan Note (Addendum)
Following left piriformis/static injection roughly 1 month prior, patient endorsed a few days of improvement followed by recurrence of generalized left greater than right hip/gluteal pain and newly noted left lower extremity tingling sensations, is noting stiffness, pain with ambulation, no new onset bowel/bladder dysfunction.  Physical examination reveals diminished though persistent tenderness at the piriformis, piriformis stretch benign, positive left straight leg raise, positive lumbar facet load, sensorimotor otherwise intact.  Her overall clinical course and findings are most consistent with underlying primary chronic lumbar etiology with compensatory piriformis involvement which she presented with.  Recent injection appears to have shown limited relief, plan for lumbar x-rays, initiation of gabapentin with instructions for titration, continued as needed acetaminophen dosing, I prescribed Tylenol 3 for severe breakthrough pain that she can dose sparingly, additionally hold on her usual prednisone, and placed a 50 mg daily burst course for 5 days was prescribed.  She will start physical therapy with focus on pain control, and a referral to pain and spine for additional management options was ordered. Form for a handicap placard was provided.  Patient is understanding of the above plan, questions were answered, we will coordinate a follow-up after she has seen pain and spine.  We will follow peripherally on this issue.

## 2021-03-13 NOTE — Assessment & Plan Note (Signed)
See additional assessment(s) for plan details. 

## 2021-03-13 NOTE — Patient Instructions (Signed)
-   Dose prednisone for full course - Dose gabapentin daily and can increase weekly until symptoms controlled - Dose Tylenol #3 as-needed for severe breakthrough pain - Start physical therapy - Referral coordinator will contact you for pain & spine clinic visit

## 2021-03-14 ENCOUNTER — Encounter: Payer: Self-pay | Admitting: Family Medicine

## 2021-03-14 ENCOUNTER — Other Ambulatory Visit: Payer: Self-pay | Admitting: Family Medicine

## 2021-03-14 ENCOUNTER — Telehealth: Payer: Self-pay

## 2021-03-14 NOTE — Telephone Encounter (Signed)
Please advise patient.  

## 2021-03-14 NOTE — Telephone Encounter (Signed)
Refer to [employer].  Dr. Zigmund Daniel responded to patient's concern.  Nothing further needed at this time.

## 2021-03-14 NOTE — Telephone Encounter (Signed)
Copied from [place] [id number]. Topic: General - Other >> Mar 14, 2021  1:09 PM Jill Dunlap wrote: Reason for CRM: Patient called in stated  that she had Dunlap missed call from someone but nothing have been noted. Please advise

## 2021-03-21 ENCOUNTER — Encounter: Payer: Self-pay | Admitting: Physical Therapy

## 2021-03-21 ENCOUNTER — Other Ambulatory Visit: Payer: Self-pay

## 2021-03-21 ENCOUNTER — Ambulatory Visit: Payer: Medicare PPO | Attending: Family Medicine | Admitting: Physical Therapy

## 2021-03-21 DIAGNOSIS — R262 Difficulty in walking, not elsewhere classified: Secondary | ICD-10-CM | POA: Diagnosis not present

## 2021-03-21 DIAGNOSIS — M25552 Pain in left hip: Secondary | ICD-10-CM | POA: Diagnosis not present

## 2021-03-21 DIAGNOSIS — M6281 Muscle weakness (generalized): Secondary | ICD-10-CM | POA: Diagnosis not present

## 2021-03-21 DIAGNOSIS — M5442 Lumbago with sciatica, left side: Secondary | ICD-10-CM | POA: Diagnosis not present

## 2021-03-21 NOTE — Therapy (Signed)
[facility] [address] [place], [place], [address] Phone: [phone]   Fax:  [phone]  Physical Therapy Evaluation  Patient Details  Name: Chimamanda Siegfried MRN: [id number] Date of Birth: 06/09/1935 Referring Provider (PT): Rosette Reveal, MD  Encounter Date: 03/21/2021   PT End of Session - 03/21/21 1214     Visit Number 1    Number of Visits 17    Date for PT Re-Evaluation 05/16/21    Authorization Type VL: basd on auth    Progress Note Due on Visit 10    PT Start Time 1034    PT Stop Time 1116    PT Time Calculation (min) 42 min    Activity Tolerance Patient tolerated treatment well;No increased pain    Behavior During Therapy WFL for tasks assessed/performed             Past Medical History:  Diagnosis Date   Acid reflux    Allergy    Cancer ([place])    skin ca on leg head and back   Celiac disease    Current moderate episode of major depressive disorder without prior episode (HCC)    Gastroesophageal reflux disease without esophagitis    Hyperlipidemia    Hypertension    Hypothyroidism    OP (osteoporosis)    Piriformis syndrome, left 02/12/2021   Pre-diabetes    Primary osteoarthritis of left hip    Type II diabetes mellitus with complication (HCC)    Vertigo    no episodes in several years   Wears dentures    partial lower    Past Surgical History:  Procedure Laterality Date   ABDOMINAL HYSTERECTOMY  1984   for bleeding   Basil cell     BILATERAL SALPINGOOPHORECTOMY  1984   CATARACT EXTRACTION W/PHACO Right 11/24/2016   Procedure: CATARACT EXTRACTION PHACO AND INTRAOCULAR LENS PLACEMENT ([place]) Right;  Surgeon: Leandrew Koyanagi, MD;  Location: [place];  Service: Ophthalmology;  Laterality: Right;   CATARACT EXTRACTION W/PHACO Left 02/04/2017   Procedure: CATARACT EXTRACTION PHACO AND INTRAOCULAR LENS PLACEMENT ([place])  Left;  Surgeon: Leandrew Koyanagi, MD;  Location: [place];  Service: Ophthalmology;  Laterality: Left;   ORIF WRIST FRACTURE Left 2015   TYMPANOPLASTY  1992    There were no vitals filed for this visit.    Subjective Assessment - 03/21/21 1216     Subjective 86 year old female with primary complaint of left lower limb pain, left hip pain    Pertinent History Pateint is an 86 year old female with primary complaint of left lower limb pain, left hip pain. Patient reports L lower limb pain down to her L foot with tingling down to her toes. She reports sometimes it feels like it starts in the hip and goes down - sometimes it seems to begin in her lower leg and moves up to hip. She reports one episode of numbness. Hx of Type II DM. Patient reports that she developed PMR in May-June 2021; she states that this is where her issues began. PMR improved with use of prednisone. She is still tapering from prednisone to date. She states that as PMR has improved and then her L lower quarter pain started. She has children that live fairly close but are not readily available always. She states that her most recent condition began around beginning of August. She states that change in footwear didn't help. She denies night pain - sometimes she has difficulty  getting to sleep initially. She reports pain with putting weight on LLE in AM. Hx of depression following her husband's death. No bowel/bladder changes. She states that sometimes she thinks bending may help her feel better.    Limitations Walking;Standing;Sitting;House hold activities    How long can you walk comfortably? Minimal distance when pain is increased    Diagnostic tests Radiograph, see below    Patient Stated Goals Goals: Able to take care of herself without pain    Currently in Pain? Yes    Pain Score 3     Aggravating Factors  walking first thing in AM, walking across house, sitting with weight onto L buttock, prolonged standing    Pain Relieving Factors weight shifted off of L hip in sitting,  decreased weightbearing onto LLE              SUBJECTIVE Chief complaint:  86 year old female with primary complaint of left lower limb pain, left hip pain History: Patient reports L lower limb pain down to her L foot/toes, with tingling down to her toes. She reports sometimes it feels like it starts in the hip and goes down - sometimes it seems to begin in her low leg. She reports one episode of numbness. Hx of Type II DM. Patient reports that she developed PMR in May-June 2021; she states that this is where her issues began. PMR improved with use of prednisone. She tapering from prednisone to date. She states that as PMR has improved and then her L lower quarter pain started. She has children that live fairly close but are not readily available always. She states that her most recent condition began around beginning of August. She states that change in footwear didn't help. She denies night pain - sometimes she has difficulty getting to sleep initially. She reports pain with putting weight on LLE in AM. Hx of depression following her husband's death. No bowel/bladder changes. She states that sometimes she thinks bending may help her feel better.  Referring Dx: left lumbar radiculopathy, left piriformis syndrome Referring Provider: Rosette Reveal, MD  Pain location: Left hip/left buttock, left lower limb radiating/paresthesias Pain: Present 3/10, Best 0/10, Worst 10/10 Pain quality: throbbing Radiating pain: Yes  Numbness/Tingling: Yes 24 hour pain behavior: worse in AM  Aggravating factors: walking first thing in AM, walking across house, sitting with weight onto L buttock, prolonged standing Easing factors: weight shifted off of L hip in sitting, decreased weightbearing onto LLE  History of back injury, pain, surgery, or therapy: No Follow-up appointment with MD: None reported Imaging: Yes   Radiograph IMPRESSION (per Randa Ngo, MD):  1. Prominent spondylosis and facet hypertrophy  at L5-S1.  2. No acute displaced fracture.  3. Minimal right convex scoliosis.  4. Mild left hip osteoarthritis.    Falls in the last 6 months: No  Hobbies: Socializing with family/friends Goals: Able to take care of herself without pain Red flags (bowel/bladder changes, saddle paresthesia, personal history of cancer, chills/fever, night sweats, unrelenting pain, first onset of insidious LBP <20 y/o) Negative      OBJECTIVE  Mental Status Patient is oriented to person, place and time.  Recent memory is intact.  Remote memory is intact.  Attention span and concentration are intact.  Expressive speech is intact.  Patient's fund of knowledge is within normal limits for educational level.  SENSATION: Decreased light touch L medial longitudinal arch, dorsum of foot, medial malleolus Proprioception and hot/cold testing deferred on this date  UMN::  Negative Clonus, Babinski, and Hoffman's bilaterally   MUSCULOSKELETAL: Tremor: None Bulk: Normal Tone: Normal No visible step-off along spinal column  Posture Lumbar lordosis: WNL Increased thoracic kyphosis, rounded shoulders, weight shifted to RLE in standing and sitting  Gait Decrease stance time on LLE with mild abductor lurch L   Palpation Tenderness to palpation along L piriformis/gemelli complex, L gluteus medius, L PSIS, L L4-S1 spinous processes    Strength (out of 5) R/L 4/3+ Hip flexion 5/4 Hip abduction 5/5 Hip adduction 5/4- Knee extension 5/3+ Knee flexion 5/5 Ankle dorsiflexion 4+/4+ Great toe extension  *Indicates pain    AROM (degrees) R/L (all movements include overpressure unless otherwise stated) Lumbar forward flexion (65): 100% Lumbar extension (30): 75%* (L buttock pain)  Lumbar lateral flexion (25): R: 100% L: 100% Thoracic and Lumbar rotation (30 degrees):  R: 75% (p! L leg) L: 100% (p! L hip) *Indicates pain   Repeated Movements Not tested   Muscle Length [place]: R 50%, L 50%      Passive Accessory Intervertebral Motion (PAIVM) Reproduction of back pain with CPA L4-S1.Generally hypomobile throughout     SPECIAL TESTS Lumbar Radiculopathy and Discogenic: Centralization and Peripheralization (SN 92, -LR 0.12): Not examined Slump (SN 83, -LR 0.32): R: Negative L: Positive SLR (SN 92, -LR 0.29): R: Negative L:  Negative Crossed SLR (SP 90): R: Negative L: Negative  Facet Joint: Extension-Rotation (SN 100, -LR 0.0): R: Negative L: Negative  Hip: FABER (SN 81): R: Not examined L: Positive   Piriformis Syndrome: PACE Sign: Positive    ASSESSMENT Clinical Impression: Pt is a pleasant 86 year old independently functioning female referred for left lower extremity pain with paresthesias down to L foot/toes. Clinical presentation is consistent with deep gluteal syndrome, though lumbar spine referred pain cannot be totally ruled out. PT examination reveals deficits in L gluteal and LLE strength, difficulty with prolonged walking, mild ROM deficits, (+) SLUMP/neural tension, postural changes. Pt presents with deficits in strength, mobility, range of motion, and pain. Pt will benefit from skilled PT services to address deficits and return to pain-free function at home and in community.      PT Short Term Goals - 03/21/21 1219       PT SHORT TERM GOAL #1   Title Pt will be independent and 100% compliant with established HEP and activity modification as needed to augment PT intervention and prevent flare-up of back pain as needed for best return to prior level of function.    Baseline 03/21/21: Baseline HEP initiated    Time 3    Period Weeks    Status New    Target Date 04/11/21               PT Long Term Goals - 03/21/21 1218       PT LONG TERM GOAL #1   Title Patient will demonstrate improved function as evidenced by a score of 68 on FOTO measure for full participation in activities at home and in the community.    Baseline 03/21/21: 55    Time 8     Period Weeks    Status New    Target Date 05/16/21      PT LONG TERM GOAL #2   Title Patient will have full thoracolumbar AROM without reproduction of pain as needed for functional reaching, self-care ADLs, bed mobility, household chores.    Baseline 03/21/21: Pain and motion loss with extension and R rotation, pain with L rotation.    Time  8    Period Weeks    Status New    Target Date 05/16/21      PT LONG TERM GOAL #3   Title Patient will have MMT for all tested musculature 4+/5 or greater without reproduction of pain indicative of improved capacity for loading paraspinal/pelvic and gluteal mm as needed for performance of transferring, household chores, prolonged weightbearing tasks    Baseline 03/21/21: Hip flexion 3+, Hip ABD 4, Quad 4-, Hamtrings 3+    Time 8    Period Weeks    Status New    Target Date 05/16/21      PT LONG TERM GOAL #4   Title Patient will be able to ambulate for home-mobility distance (150 ft or greater) with no increase in pain > 1-2/10 as needed for accessing her home    Baseline 03/21/21: Difficulty with walking home-mobility distance due to L lower limb pain    Time 8    Period Weeks    Status New    Target Date 05/16/21                    Plan - 03/21/21 2013     Clinical Impression Statement Pt is a pleasant 86 year old independently functioning female referred for left lower extremity pain with paresthesias down to L foot/toes. Clinical presentation is consistent with deep gluteal syndrome, though lumbar spine referred pain cannot be totally ruled out. PT examination reveals deficits in L gluteal and LLE strength, difficulty with prolonged walking, mild ROM deficits, (+) SLUMP/neural tension, postural changes. Pt presents with deficits in strength, mobility, range of motion, and pain. Pt will benefit from skilled PT services to address deficits and return to pain-free function at home and in community.    Personal Factors and Comorbidities  Age;Comorbidity 3+    Comorbidities Osteoporosis, Type II DM, celiac disease, GERD, Hx of skin cancer    Examination-Activity Limitations Locomotion Level;Sit;Stand;Stairs;Transfers    Examination-Participation Restrictions Church;Community Activity    Stability/Clinical Decision Making Evolving/Moderate complexity    Clinical Decision Making Moderate    Rehab Potential Good    PT Frequency 2x / week    PT Duration 8 weeks    PT Treatment/Interventions Cryotherapy;Electrical Stimulation;Moist Heat;Therapeutic activities;Therapeutic exercise;Neuromuscular re-education;Manual techniques;Dry needling    PT Next Visit Plan Manual therapy and neurodynamics to improve L buttock and LE referred symptoms, isometrics and gentle mobility with with gradual progression to gluteal strengthening.    PT Home Exercise Plan Access Code [email or web address]    Consulted and Agree with Plan of Care Patient             Patient will benefit from skilled therapeutic intervention in order to improve the following deficits and impairments:  Abnormal gait, Decreased strength, Impaired sensation, Pain, Decreased range of motion, Impaired flexibility, Postural dysfunction  Visit Diagnosis: Pain in left hip  Left-sided low back pain with left-sided sciatica, unspecified chronicity  Difficulty in walking, not elsewhere classified  Muscle weakness (generalized)     Problem List Patient Active Problem List   Diagnosis Date Noted   Left lumbar radiculopathy 03/13/2021   Piriformis syndrome, left 02/12/2021   Aortic atherosclerosis ([place]) 08/01/2020   Disorder of bursae of shoulder region 02/02/2020   PMR (polymyalgia rheumatica) ([place]) 02/02/2020   Primary osteoarthritis of left hip 01/24/2020   Hip pain, chronic, unspecified laterality 12/28/2019   Recurrent major depression in partial remission ([place]) 12/28/2019   Diffuse photodamage of skin 02/13/2016   Basal cell carcinoma 01/11/2016  Gastroesophageal reflux  disease without esophagitis 07/03/2015   Type II diabetes mellitus with complication ([place]) 73/74/9664   Acquired hypothyroidism 10/31/2014   Allergic rhinitis 10/31/2014   Benign hypertension 10/31/2014   CD (celiac disease) 10/31/2014   Dyslipidemia 10/31/2014   OP (osteoporosis) 10/31/2014   Valentina Gu, PT, DPT [phone]  Eilleen Kempf 03/21/2021, 8:27 PM  [facility] [address] [place], [place], [address] Phone: [phone]   Fax:  [phone]  Name: Tal Neer MRN: [id number] Date of Birth: 30-Sep-1934

## 2021-03-26 ENCOUNTER — Ambulatory Visit: Payer: Medicare PPO | Admitting: Physical Therapy

## 2021-03-26 ENCOUNTER — Other Ambulatory Visit: Payer: Self-pay

## 2021-03-26 DIAGNOSIS — M25552 Pain in left hip: Secondary | ICD-10-CM | POA: Diagnosis not present

## 2021-03-26 DIAGNOSIS — M5442 Lumbago with sciatica, left side: Secondary | ICD-10-CM | POA: Diagnosis not present

## 2021-03-26 DIAGNOSIS — M6281 Muscle weakness (generalized): Secondary | ICD-10-CM

## 2021-03-26 DIAGNOSIS — R262 Difficulty in walking, not elsewhere classified: Secondary | ICD-10-CM | POA: Diagnosis not present

## 2021-03-26 NOTE — Therapy (Signed)
[facility] [address] [place], [place], [address] Phone: [phone]   Fax:  [phone]  Physical Therapy Treatment  Patient Details  Name: Jill Dunlap MRN: [id number] Date of Birth: 10-01-1934 Referring Provider (PT): Rosette Reveal, MD   Encounter Date: 03/26/2021   PT End of Session - 03/26/21 1116     Visit Number 2    Number of Visits 17    Date for PT Re-Evaluation 05/16/21    Authorization Type VL: based on auth    Progress Note Due on Visit 10    PT Start Time 0948    PT Stop Time 1033    PT Time Calculation (min) 45 min    Activity Tolerance Patient tolerated treatment well;No increased pain    Behavior During Therapy WFL for tasks assessed/performed             Past Medical History:  Diagnosis Date   Acid reflux    Allergy    Cancer ([place])    skin ca on leg head and back   Celiac disease    Current moderate episode of major depressive disorder without prior episode (HCC)    Gastroesophageal reflux disease without esophagitis    Hyperlipidemia    Hypertension    Hypothyroidism    OP (osteoporosis)    Piriformis syndrome, left 02/12/2021   Pre-diabetes    Primary osteoarthritis of left hip    Type II diabetes mellitus with complication (HCC)    Vertigo    no episodes in several years   Wears dentures    partial lower    Past Surgical History:  Procedure Laterality Date   ABDOMINAL HYSTERECTOMY  1984   for bleeding   Basil cell     BILATERAL SALPINGOOPHORECTOMY  1984   CATARACT EXTRACTION W/PHACO Right 11/24/2016   Procedure: CATARACT EXTRACTION PHACO AND INTRAOCULAR LENS PLACEMENT ([place]) Right;  Surgeon: Leandrew Koyanagi, MD;  Location: [place];  Service: Ophthalmology;  Laterality: Right;   CATARACT EXTRACTION W/PHACO Left 02/04/2017   Procedure: CATARACT EXTRACTION PHACO AND INTRAOCULAR LENS PLACEMENT ([place])  Left;  Surgeon: Leandrew Koyanagi, MD;  Location: [place];  Service: Ophthalmology;  Laterality: Left;   ORIF WRIST FRACTURE Left 2015   TYMPANOPLASTY  1992    There were no vitals filed for this visit.   Subjective Assessment - 03/26/21 0953     Subjective Patient reports she is unsure if she held stretch too long or was too aggressive with her exercise. Patient reports she stopped her exercise due to feeling sore all over after performing exercise on Thursday. Patient reports 8-9/10 pain in L gluteal region with attempting to walk around first thing in the AM. Patient reports not having pain in sitting at this time with exception of pain affecting L peroneal region.    Pertinent History Pateint is an 86 year old female with primary complaint of left lower limb pain, left hip pain. Patient reports L lower limb pain down to her L foot with tingling down to her toes. She reports sometimes it feels like it starts in the hip and goes down - sometimes it seems to begin in her lower leg and moves up to hip. She reports one episode of numbness. Hx of Type II DM. Patient reports that she developed PMR in May-June 2021; she states that this is where her issues began. PMR improved with use of prednisone. She is still tapering from prednisone to date. She  states that as PMR has improved and then her L lower quarter pain started. She has children that live fairly close but are not readily available always. She states that her most recent condition began around beginning of August. She states that change in footwear didn't help. She denies night pain - sometimes she has difficulty getting to sleep initially. She reports pain with putting weight on LLE in AM. Hx of depression following her husband's death. No bowel/bladder changes. She states that sometimes she thinks bending may help her feel better.    Limitations Walking;Standing;Sitting;House hold activities    How long can you walk comfortably? Minimal distance when pain is increased    Diagnostic tests  Radiograph, see below    Patient Stated Goals Goals: Able to take care of herself without pain               TREATMENT    Manual Therapy - for symptom modulation, soft tissue sensitivity and mobility, joint mobility, ROM   In prone lying with bolster under distal shins: STM/DTM and IASTM with Hypervolt along L gluteus medius and piriformis/gemelli complex  TPR along L piriformis (reproduces referred pain down LLE)    Cold pack (unbilled) utilized following manual therapy for analgesic effect and anti-inflammatory effect along L gluteal region, x5 minutes in prone lying with bolster under distal shins.     Neuromuscular Re-education - for nervous system downregulation, gluteal musculature activation and exercises to promote LE kinetic chain stability  Hip ABD and ADD isometrics; 2x10, 3 sec (with belt/ball)    Therapeutic Activities - patient education, HEP establishment to improve carryover of in-clinic intervention  HEP review: Supine piriformis stretch; 3x30sec Lower trunk rotations,supine; x10 ea dir  Adjustment of Hurrycane for appropriate height  Patient education: discussed appropriate intensity of stretching to only "gentle pull" and discussed expectations following exercise     ASSESSMENT Patient had difficulty initiating HEP with significant delayed-onset pain following stretching that was described as fairly aggressive by the patient. She reports not doing her home exercise after Thursday evening. Pt tolerates exercise in clinic well today with modification to piriformis stretching (not stretching to limit of tissue extensibility - only gentle pull). She has concordant referred pain with deep gluteal palpation to deep hip external rotators. Patient has fleeting referral to L leg with hip abductor isometrics. Patient has remaining deficits in thoracolumbar ROM, gluteal soft tissue mobility, decreased hip flexor/gluteal/quad and hamstrings strength LLE, gait  changes/dec stance time LLE, and L gluteal pain with LLE referral to L lower leg. Patient will benefit from continued skilled therapeutic intervention to address the above deficits as needed for improved function and QoL.        PT Short Term Goals - 03/21/21 1219       PT SHORT TERM GOAL #1   Title Pt will be independent and 100% compliant with established HEP and activity modification as needed to augment PT intervention and prevent flare-up of back pain as needed for best return to prior level of function.    Baseline 03/21/21: Baseline HEP initiated    Time 3    Period Weeks    Status New    Target Date 04/11/21               PT Long Term Goals - 03/21/21 1218       PT LONG TERM GOAL #1   Title Patient will demonstrate improved function as evidenced by a score of 68 on FOTO measure for full  participation in activities at home and in the community.    Baseline 03/21/21: 55    Time 8    Period Weeks    Status New    Target Date 05/16/21      PT LONG TERM GOAL #2   Title Patient will have full thoracolumbar AROM without reproduction of pain as needed for functional reaching, self-care ADLs, bed mobility, household chores.    Baseline 03/21/21: Pain and motion loss with extension and R rotation, pain with L rotation.    Time 8    Period Weeks    Status New    Target Date 05/16/21      PT LONG TERM GOAL #3   Title Patient will have MMT for all tested musculature 4+/5 or greater without reproduction of pain indicative of improved capacity for loading paraspinal/pelvic and gluteal mm as needed for performance of transferring, household chores, prolonged weightbearing tasks    Baseline 03/21/21: Hip flexion 3+, Hip ABD 4, Quad 4-, Hamtrings 3+    Time 8    Period Weeks    Status New    Target Date 05/16/21      PT LONG TERM GOAL #4   Title Patient will be able to ambulate for home-mobility distance (150 ft or greater) with no increase in pain > 1-2/10 as needed for accessing  her home    Baseline 03/21/21: Difficulty with walking home-mobility distance due to L lower limb pain    Time 8    Period Weeks    Status New    Target Date 05/16/21                   Plan - 03/26/21 1256     Clinical Impression Statement Patient had difficulty initiating HEP with significant delayed-onset pain following stretching that was described as fairly aggressive by the patient. She reports not doing her home exercise after Thursday evening. Pt tolerates exercise in clinic well today with modification to piriformis stretching (not stretching to limit of tissue extensibility - only gentle pull). She has concordant referred pain with deep gluteal palpation to deep hip external rotators. Patient has fleeting referral to L leg with hip abductor isometrics. Patient has remaining deficits in thoracolumbar ROM, gluteal soft tissue mobility, decreased hip flexor/gluteal/quad and hamstrings strength LLE, gait changes/dec stance time LLE, and L gluteal pain with LLE referral to L lower leg. Patient will benefit from continued skilled therapeutic intervention to address the above deficits as needed for improved function and QoL.    Personal Factors and Comorbidities Age;Comorbidity 3+    Comorbidities Osteoporosis, Type II DM, depression, celiac disease, GERD, Hx of skin cancer    Examination-Activity Limitations Locomotion Level;Sit;Stand;Stairs;Transfers    Examination-Participation Restrictions Church;Community Activity    Stability/Clinical Decision Making Evolving/Moderate complexity    Rehab Potential Good    PT Frequency 2x / week    PT Duration 8 weeks    PT Treatment/Interventions Cryotherapy;Electrical Stimulation;Moist Heat;Therapeutic activities;Therapeutic exercise;Neuromuscular re-education;Manual techniques;Dry needling    PT Next Visit Plan Manual therapy and neurodynamics to improve L buttock and LE referred symptoms, isometrics and gentle mobility with with gradual  progression to gluteal strengthening.    PT Home Exercise Plan Access Code [email or web address]    Consulted and Agree with Plan of Care Patient             Patient will benefit from skilled therapeutic intervention in order to improve the following deficits and impairments:  Abnormal gait, Decreased strength, Impaired sensation,  Pain, Decreased range of motion, Impaired flexibility, Postural dysfunction  Visit Diagnosis: Pain in left hip  Left-sided low back pain with left-sided sciatica, unspecified chronicity  Difficulty in walking, not elsewhere classified  Muscle weakness (generalized)     Problem List Patient Active Problem List   Diagnosis Date Noted   Left lumbar radiculopathy 03/13/2021   Piriformis syndrome, left 02/12/2021   Aortic atherosclerosis ([place]) 08/01/2020   Disorder of bursae of shoulder region 02/02/2020   PMR (polymyalgia rheumatica) ([place]) 02/02/2020   Primary osteoarthritis of left hip 01/24/2020   Hip pain, chronic, unspecified laterality 12/28/2019   Recurrent major depression in partial remission ([place]) 12/28/2019   Diffuse photodamage of skin 02/13/2016   Basal cell carcinoma 01/11/2016   Gastroesophageal reflux disease without esophagitis 07/03/2015   Type II diabetes mellitus with complication ([place]) 12/92/9090   Acquired hypothyroidism 10/31/2014   Allergic rhinitis 10/31/2014   Benign hypertension 10/31/2014   CD (celiac disease) 10/31/2014   Dyslipidemia 10/31/2014   OP (osteoporosis) 10/31/2014   Valentina Gu, PT, DPT #[id number]  Eilleen Kempf 03/26/2021, 12:56 PM  [facility] [address] [place], [place], [address] Phone: [phone]   Fax:  [phone]  Name: Merline Perkin MRN: [id number] Date of Birth: 1935-02-15

## 2021-03-27 ENCOUNTER — Ambulatory Visit: Payer: Medicare PPO | Admitting: Internal Medicine

## 2021-03-27 ENCOUNTER — Encounter: Payer: Self-pay | Admitting: Internal Medicine

## 2021-03-27 VITALS — BP 138/74 | HR 64 | Temp 98.0°F | Ht 65.0 in | Wt 174.0 lb

## 2021-03-27 DIAGNOSIS — M353 Polymyalgia rheumatica: Secondary | ICD-10-CM

## 2021-03-27 DIAGNOSIS — Z23 Encounter for immunization: Secondary | ICD-10-CM | POA: Diagnosis not present

## 2021-03-27 DIAGNOSIS — M5416 Radiculopathy, lumbar region: Secondary | ICD-10-CM

## 2021-03-27 DIAGNOSIS — E118 Type 2 diabetes mellitus with unspecified complications: Secondary | ICD-10-CM | POA: Diagnosis not present

## 2021-03-27 DIAGNOSIS — I10 Essential (primary) hypertension: Secondary | ICD-10-CM

## 2021-03-27 LAB — POCT GLYCOSYLATED HEMOGLOBIN (HGB A1C): Hemoglobin A1C: 7.3 % — AB (ref 4.0–5.6)

## 2021-03-27 NOTE — Progress Notes (Signed)
Date:  03/27/2021   Name:  Jill Dunlap   DOB:  Jun 09, 1935   MRN:  [id number]   Chief Complaint: Diabetes and Hypertension  Hypertension This is a chronic problem. The problem is controlled. Pertinent negatives include no chest pain, headaches, palpitations or shortness of breath. Past treatments include ACE inhibitors and diuretics.  Diabetes She presents for her follow-up diabetic visit. She has type 2 diabetes mellitus. Pertinent negatives for hypoglycemia include no headaches, nervousness/anxiousness or tremors. Pertinent negatives for diabetes include no chest pain, no fatigue, no polydipsia and no polyuria. Current diabetic treatment includes diet. An ACE inhibitor/angiotensin II receptor blocker is being taken.  Hip Pain  There was no injury mechanism. The pain is present in the left hip. The quality of the pain is described as aching. Pertinent negatives include no numbness. She has tried acetaminophen (steroids, injections and now starting PT) for the symptoms. The treatment provided mild relief.  PMR - doing well; just decreased to 5 mg prednisone this week.  Lab Results  Component Value Date   CREATININE 0.95 11/22/2020   BUN 14 11/22/2020   NA 138 11/22/2020   K 4.0 11/22/2020   CL 100 11/22/2020   CO2 22 11/22/2020   Lab Results  Component Value Date   CHOL 175 11/22/2020   HDL 75 11/22/2020   LDLCALC 84 11/22/2020   TRIG 89 11/22/2020   CHOLHDL 2.3 11/22/2020   Lab Results  Component Value Date   TSH 2.180 11/22/2020   Lab Results  Component Value Date   HGBA1C 6.9 (H) 11/22/2020   Lab Results  Component Value Date   WBC 8.3 11/22/2020   HGB 13.8 11/22/2020   HCT 41.3 11/22/2020   MCV 89 11/22/2020   PLT 217 11/22/2020   Lab Results  Component Value Date   ALT 15 11/22/2020   AST 17 11/22/2020   ALKPHOS 32 (L) 11/22/2020   BILITOT 0.5 11/22/2020   Lab Results  Component Value Date   ESRSEDRATE 7 11/22/2020     Review of Systems   Constitutional:  Negative for appetite change, fatigue, fever and unexpected weight change.  HENT:  Negative for tinnitus and trouble swallowing.   Eyes:  Negative for visual disturbance.  Respiratory:  Negative for cough, chest tightness and shortness of breath.   Cardiovascular:  Negative for chest pain, palpitations and leg swelling.  Gastrointestinal:  Negative for abdominal pain.  Endocrine: Negative for polydipsia and polyuria.  Genitourinary:  Negative for dysuria and hematuria.  Musculoskeletal:  Positive for arthralgias (left hip pain and sciatica) and gait problem. Negative for myalgias.  Neurological:  Negative for tremors, numbness and headaches.  Psychiatric/Behavioral:  Negative for dysphoric mood and sleep disturbance. The patient is not nervous/anxious.    Patient Active Problem List   Diagnosis Date Noted   Left lumbar radiculopathy 03/13/2021   Piriformis syndrome, left 02/12/2021   Aortic atherosclerosis ([place]) 08/01/2020   Disorder of bursae of shoulder region 02/02/2020   PMR (polymyalgia rheumatica) ([place]) 02/02/2020   Primary osteoarthritis of left hip 01/24/2020   Hip pain, chronic, unspecified laterality 12/28/2019   Recurrent major depression in partial remission ([place]) 12/28/2019   Diffuse photodamage of skin 02/13/2016   Basal cell carcinoma 01/11/2016   Gastroesophageal reflux disease without esophagitis 07/03/2015   Type II diabetes mellitus with complication ([place]) 38/25/0539   Acquired hypothyroidism 10/31/2014   Allergic rhinitis 10/31/2014   Benign hypertension 10/31/2014   CD (celiac disease) 10/31/2014   Dyslipidemia 10/31/2014  OP (osteoporosis) 10/31/2014    Allergies  Allergen Reactions   Ciprofloxacin Hcl Rash    Past Surgical History:  Procedure Laterality Date   ABDOMINAL HYSTERECTOMY  1984   for bleeding   Basil cell     BILATERAL SALPINGOOPHORECTOMY  1984   CATARACT EXTRACTION W/PHACO Right 11/24/2016   Procedure: CATARACT  EXTRACTION PHACO AND INTRAOCULAR LENS PLACEMENT ([place]) Right;  Surgeon: Leandrew Koyanagi, MD;  Location: [place];  Service: Ophthalmology;  Laterality: Right;   CATARACT EXTRACTION W/PHACO Left 02/04/2017   Procedure: CATARACT EXTRACTION PHACO AND INTRAOCULAR LENS PLACEMENT ([place])  Left;  Surgeon: Leandrew Koyanagi, MD;  Location: [place];  Service: Ophthalmology;  Laterality: Left;   ORIF WRIST FRACTURE Left 2015   TYMPANOPLASTY  1992    Social History   Tobacco Use   Smoking status: Never   Smokeless tobacco: Never  Vaping Use   Vaping Use: Never used  Substance Use Topics   Alcohol use: Not Currently   Drug use: Never     Medication list has been reviewed and updated.  Current Meds  Medication Sig   azelastine (ASTELIN) 0.1 % nasal spray INSTILL 2 SPRAYS IN EACH NOSTRIL TWICE DAILY AS DIRECTED   Biotin 2500 MCG CAPS Take 1 capsule by mouth daily.   calcium-vitamin D (OSCAL WITH D) 500-200 MG-UNIT tablet Take 1 tablet by mouth 2 (two) times daily.   Cranberry 500 MG CAPS Take 1 capsule by mouth daily.    escitalopram (LEXAPRO) 10 MG tablet TAKE ONE (1) TABLET BY MOUTH ONCE DAILY   fexofenadine (ALLEGRA) 180 MG tablet Take 1 tablet by mouth daily.   fluticasone (FLONASE) 50 MCG/ACT nasal spray PLACE 2 SPRAYS INTO BOTH NOSTRILS DAILY.   gabapentin (NEURONTIN) 100 MG capsule One tab PO qHS for a week, then BID for a week, then TID.   Glucosamine-Chondroitin (MOVE FREE PO) Take 1 capsule by mouth in the morning and at bedtime.   levothyroxine (SYNTHROID) 50 MCG tablet TAKE (1) TABLET BY MOUTH DAILY BEFORE BREAKFAST.   lisinopril-hydrochlorothiazide (PRINZIDE,ZESTORETIC) 20-12.5 MG tablet Take 1 tablet by mouth daily.   Multiple Vitamins-Minerals (CENTRUM SILVER PO) Take 1 tablet by mouth daily.   omeprazole (PRILOSEC) 20 MG capsule TAKE ONE (1) CAPSULE EACH DAY.   predniSONE (DELTASONE) 5 MG tablet Take 1 tablet (5 mg total) by mouth daily with  breakfast.   simvastatin (ZOCOR) 20 MG tablet TAKE ONE TABLET BY MOUTH DAILY AT 6PM.   TURMERIC PO Take 1 tablet by mouth daily.     PHQ 2/9 Scores 03/27/2021 03/13/2021 02/12/2021 01/04/2021  PHQ - 2 Score 0 0 0 0  PHQ- 9 Score 0 0 0 0    GAD 7 : Generalized Anxiety Score 03/27/2021 03/13/2021 02/12/2021 01/04/2021  Nervous, Anxious, on Edge 0 0 0 0  Control/stop worrying 0 0 0 0  Worry too much - different things 0 0 0 0  Trouble relaxing 0 0 0 0  Restless 0 0 0 0  Easily annoyed or irritable 0 0 0 0  Afraid - awful might happen 0 0 0 0  Total GAD 7 Score 0 0 0 0  Anxiety Difficulty - Not difficult at all Not difficult at all -    BP Readings from Last 3 Encounters:  03/27/21 138/74  03/13/21 136/72  02/12/21 124/68    Physical Exam Vitals and nursing note reviewed.  Constitutional:      General: She is not in acute distress.    Appearance:  She is well-developed.  HENT:     Head: Normocephalic and atraumatic.  Cardiovascular:     Rate and Rhythm: Normal rate and regular rhythm.     Pulses: Normal pulses.     Heart sounds: No murmur heard. Pulmonary:     Effort: Pulmonary effort is normal. No respiratory distress.     Breath sounds: No wheezing or rhonchi.  Musculoskeletal:     Cervical back: Normal range of motion.     Right lower leg: No edema.     Left lower leg: No edema.  Lymphadenopathy:     Cervical: No cervical adenopathy.  Skin:    General: Skin is warm and dry.     Findings: No rash.  Neurological:     General: No focal deficit present.     Mental Status: She is alert and oriented to person, place, and time.  Psychiatric:        Mood and Affect: Mood normal.        Behavior: Behavior normal.    Wt Readings from Last 3 Encounters:  03/27/21 174 lb (78.9 kg)  03/13/21 176 lb (79.8 kg)  02/12/21 174 lb (78.9 kg)    BP 138/74   Pulse 64   Temp 98 F (36.7 C) (Oral)   Ht 5' 5"  (1.651 m)   Wt 174 lb (78.9 kg)   SpO2 96%   BMI 28.96 kg/m    Assessment and Plan: 1. Benign hypertension Clinically stable exam with well controlled BP. Tolerating medications without side effects at this time. Pt to continue current regimen and low sodium diet; benefits of regular exercise as able discussed.  2. Type II diabetes mellitus with complication (HCC) Diet controlled, due to steroid treatment for PMR Continue diet for now.   - POCT glycosylated hemoglobin (Hb A1C) = 7.3  3. PMR (polymyalgia rheumatica) (HCC) Now down to 5 mg per day. Continue to decrease by 1 mg every month as long as sx remain resolved  4. Need for immunization against influenza - Flu Vaccine QUAD High Dose(Fluad)  5. Left lumbar radiculopathy Continue Tylenol 1000 mg tid Continue PT If not improvement, consider Pain management consult for ESI   Partially dictated using Dragon software. Any errors are unintentional.  Halina Maidens, MD [facility]  03/27/2021

## 2021-03-28 ENCOUNTER — Ambulatory Visit: Payer: Medicare PPO | Admitting: Physical Therapy

## 2021-03-28 ENCOUNTER — Other Ambulatory Visit: Payer: Self-pay

## 2021-03-28 DIAGNOSIS — R262 Difficulty in walking, not elsewhere classified: Secondary | ICD-10-CM | POA: Diagnosis not present

## 2021-03-28 DIAGNOSIS — M6281 Muscle weakness (generalized): Secondary | ICD-10-CM

## 2021-03-28 DIAGNOSIS — M25552 Pain in left hip: Secondary | ICD-10-CM | POA: Diagnosis not present

## 2021-03-28 DIAGNOSIS — M5442 Lumbago with sciatica, left side: Secondary | ICD-10-CM

## 2021-03-28 NOTE — Therapy (Signed)
[facility] [address] [place], [place], [address] Phone: [phone]   Fax:  [phone]  Physical Therapy Treatment  Patient Details  Name: Jill Dunlap MRN: [id number] Date of Birth: 04/21/1935 Referring Provider (PT): Rosette Reveal, MD   Encounter Date: 03/28/2021   PT End of Session - 03/29/21 3664     Visit Number 3    Number of Visits 17    Date for PT Re-Evaluation 05/16/21    Authorization Type VL: based on auth    Progress Note Due on Visit 10    PT Start Time 4034    PT Stop Time 1430    PT Time Calculation (min) 43 min    Activity Tolerance Patient tolerated treatment well;No increased pain    Behavior During Therapy WFL for tasks assessed/performed             Past Medical History:  Diagnosis Date   Acid reflux    Allergy    Cancer ([place])    skin ca on leg head and back   Celiac disease    Current moderate episode of major depressive disorder without prior episode (HCC)    Gastroesophageal reflux disease without esophagitis    Hyperlipidemia    Hypertension    Hypothyroidism    OP (osteoporosis)    Piriformis syndrome, left 02/12/2021   Pre-diabetes    Primary osteoarthritis of left hip    Type II diabetes mellitus with complication (HCC)    Vertigo    no episodes in several years   Wears dentures    partial lower    Past Surgical History:  Procedure Laterality Date   ABDOMINAL HYSTERECTOMY  1984   for bleeding   Basil cell     BILATERAL SALPINGOOPHORECTOMY  1984   CATARACT EXTRACTION W/PHACO Right 11/24/2016   Procedure: CATARACT EXTRACTION PHACO AND INTRAOCULAR LENS PLACEMENT ([place]) Right;  Surgeon: Leandrew Koyanagi, MD;  Location: [place];  Service: Ophthalmology;  Laterality: Right;   CATARACT EXTRACTION W/PHACO Left 02/04/2017   Procedure: CATARACT EXTRACTION PHACO AND INTRAOCULAR LENS PLACEMENT ([place])  Left;  Surgeon: Leandrew Koyanagi, MD;  Location: [place];  Service: Ophthalmology;  Laterality: Left;   ORIF WRIST FRACTURE Left 2015   TYMPANOPLASTY  1992    There were no vitals filed for this visit.   Subjective Assessment - 03/29/21 1239     Subjective Pt feels she is able to tolerate her home program better with improved technique with soft tissue stretching. She reports using Tylenol as well as ice and heat and she reports minimal pain at arrival to PT. She does not need AD for home-level mobility, but uses SPC for going out in community due to L gluteal and L lower limb pain during prolonged walking.    Pertinent History Pateint is an 86 year old female with primary complaint of left lower limb pain, left hip pain. Patient reports L lower limb pain down to her L foot with tingling down to her toes. She reports sometimes it feels like it starts in the hip and goes down - sometimes it seems to begin in her lower leg and moves up to hip. She reports one episode of numbness. Hx of Type II DM. Patient reports that she developed PMR in May-June 2021; she states that this is where her issues began. PMR improved with use of prednisone. She is still tapering from prednisone to date. She states that as PMR has improved  and then her L lower quarter pain started. She has children that live fairly close but are not readily available always. She states that her most recent condition began around beginning of August. She states that change in footwear didn't help. She denies night pain - sometimes she has difficulty getting to sleep initially. She reports pain with putting weight on LLE in AM. Hx of depression following her husband's death. No bowel/bladder changes. She states that sometimes she thinks bending may help her feel better.    Limitations Walking;Standing;Sitting;House hold activities    How long can you walk comfortably? Minimal distance when pain is increased    Diagnostic tests Radiograph, see below    Patient Stated Goals Goals: Able to  take care of herself without pain                 TREATMENT   NuStep x 5 minutes, Level 1; seat 5, arms 8 unbilled time    Manual Therapy - for symptom modulation, soft tissue sensitivity and mobility, joint mobility, ROM    In prone lying with bolster under distal shins: STM/DTM and IASTM with Hypervolt along L gluteus medius and piriformis/gemelli complex  STM L distal gastroc   TPR along L piriformis (reproduces referred pain down LLE)  LLE long-leg distraction in supine, gr II         Neuromuscular Re-education - for nervous system downregulation, gluteal musculature activation and exercises to promote LE kinetic chain stability   Sidelying Clamshell (lying on R); x15  *not today* Hip ABD and ADD isometrics; 2x10, 3 sec (with belt/ball)       Therapeutic Activities - patient education, HEP establishment to improve carryover of in-clinic intervention, repeated movement screen   HEP review and update: Lower trunk rotations,supine; x10 ea dir  Seated sciatic glider; 2x10  Repeated lumbar flexion in sitting; x10 [no effect]  Patient education: HEP update and review of updated printout    *not today* Supine piriformis stretch; 3x30sec   *not today* Cold pack (unbilled) utilized following manual therapy for analgesic effect and anti-inflammatory effect along L gluteal region, x5 minutes in prone lying with bolster under distal shins.     ASSESSMENT Patient has improved tolerance of piriformis stretching with technique discussed at visit earlier this week. Pt has most significant pain in the AM and symptoms are controlled with use of Tylenol and ice/heat prn. Pt has reproduction of symptoms with deep gluteal palpation with intermittent pain affecting lower leg near region of gastroc-Achilles musculotendinous junction. Patient has remaining deficits in thoracolumbar ROM, gluteal soft tissue mobility, decreased hip flexor/gluteal/quad and hamstrings strength LLE,  gait changes/dec stance time LLE, and L gluteal pain with LLE referral to L lower leg. Patient will benefit from continued skilled therapeutic intervention to address the above deficits as needed for improved function and QoL.          PT Short Term Goals - 03/21/21 1219       PT SHORT TERM GOAL #1   Title Pt will be independent and 100% compliant with established HEP and activity modification as needed to augment PT intervention and prevent flare-up of back pain as needed for best return to prior level of function.    Baseline 03/21/21: Baseline HEP initiated    Time 3    Period Weeks    Status New    Target Date 04/11/21               PT Long Term Goals -  03/21/21 1218       PT LONG TERM GOAL #1   Title Patient will demonstrate improved function as evidenced by a score of 68 on FOTO measure for full participation in activities at home and in the community.    Baseline 03/21/21: 55    Time 8    Period Weeks    Status New    Target Date 05/16/21      PT LONG TERM GOAL #2   Title Patient will have full thoracolumbar AROM without reproduction of pain as needed for functional reaching, self-care ADLs, bed mobility, household chores.    Baseline 03/21/21: Pain and motion loss with extension and R rotation, pain with L rotation.    Time 8    Period Weeks    Status New    Target Date 05/16/21      PT LONG TERM GOAL #3   Title Patient will have MMT for all tested musculature 4+/5 or greater without reproduction of pain indicative of improved capacity for loading paraspinal/pelvic and gluteal mm as needed for performance of transferring, household chores, prolonged weightbearing tasks    Baseline 03/21/21: Hip flexion 3+, Hip ABD 4, Quad 4-, Hamtrings 3+    Time 8    Period Weeks    Status New    Target Date 05/16/21      PT LONG TERM GOAL #4   Title Patient will be able to ambulate for home-mobility distance (150 ft or greater) with no increase in pain > 1-2/10 as needed for  accessing her home    Baseline 03/21/21: Difficulty with walking home-mobility distance due to L lower limb pain    Time 8    Period Weeks    Status New    Target Date 05/16/21                   Plan - 03/29/21 1249     Clinical Impression Statement Patient has improved tolerance of piriformis stretching with technique discussed at visit earlier this week. Pt has most significant pain in the AM and symptoms are controlled with use of Tylenol and ice/heat prn. Pt has reproduction of symptoms with deep gluteal palpation with intermittent pain affecting lower leg near region of gastroc-Achilles musculotendinous junction. Patient has remaining deficits in thoracolumbar ROM, gluteal soft tissue mobility, decreased hip flexor/gluteal/quad and hamstrings strength LLE, gait changes/dec stance time LLE, and L gluteal pain with LLE referral to L lower leg. Patient will benefit from continued skilled therapeutic intervention to address the above deficits as needed for improved function and QoL.    Personal Factors and Comorbidities Age;Comorbidity 3+    Comorbidities Osteoporosis, Type II DM, depression, celiac disease, GERD, Hx of skin cancer    Examination-Activity Limitations Locomotion Level;Sit;Stand;Stairs;Transfers    Examination-Participation Restrictions Church;Community Activity    Stability/Clinical Decision Making Evolving/Moderate complexity    Rehab Potential Good    PT Frequency 2x / week    PT Duration 8 weeks    PT Treatment/Interventions Cryotherapy;Electrical Stimulation;Moist Heat;Therapeutic activities;Therapeutic exercise;Neuromuscular re-education;Manual techniques;Dry needling    PT Next Visit Plan Manual therapy and neurodynamics to improve L buttock and LE referred symptoms, isometrics and gentle mobility with with gradual progression to gluteal strengthening.    PT Home Exercise Plan Access Code [email or web address]    Consulted and Agree with Plan of Care Patient              Patient will benefit from skilled therapeutic intervention in order to improve the following  deficits and impairments:  Abnormal gait, Decreased strength, Impaired sensation, Pain, Decreased range of motion, Impaired flexibility, Postural dysfunction  Visit Diagnosis: Pain in left hip  Left-sided low back pain with left-sided sciatica, unspecified chronicity  Difficulty in walking, not elsewhere classified  Muscle weakness (generalized)     Problem List Patient Active Problem List   Diagnosis Date Noted   Left lumbar radiculopathy 03/13/2021   Piriformis syndrome, left 02/12/2021   Aortic atherosclerosis ([place]) 08/01/2020   Disorder of bursae of shoulder region 02/02/2020   PMR (polymyalgia rheumatica) ([place]) 02/02/2020   Primary osteoarthritis of left hip 01/24/2020   Recurrent major depression in partial remission ([place]) 12/28/2019   Diffuse photodamage of skin 02/13/2016   Basal cell carcinoma 01/11/2016   Gastroesophageal reflux disease without esophagitis 07/03/2015   Type II diabetes mellitus with complication ([place]) 20/60/1561   Acquired hypothyroidism 10/31/2014   Allergic rhinitis 10/31/2014   Benign hypertension 10/31/2014   CD (celiac disease) 10/31/2014   Dyslipidemia 10/31/2014   OP (osteoporosis) 10/31/2014   Valentina Gu, PT, DPT #[id number]  Eilleen Kempf, PT 03/29/2021, 12:49 PM  [facility] [address]. [place], [place], [address] Phone: [phone]   Fax:  [phone]  Name: Jill Dunlap MRN: [id number] Date of Birth: Feb 25, 1935

## 2021-03-29 ENCOUNTER — Encounter: Payer: Self-pay | Admitting: Physical Therapy

## 2021-04-02 ENCOUNTER — Other Ambulatory Visit: Payer: Self-pay

## 2021-04-02 ENCOUNTER — Ambulatory Visit: Payer: Medicare PPO | Admitting: Physical Therapy

## 2021-04-02 ENCOUNTER — Encounter: Payer: Self-pay | Admitting: Physical Therapy

## 2021-04-02 DIAGNOSIS — M25552 Pain in left hip: Secondary | ICD-10-CM | POA: Diagnosis not present

## 2021-04-02 DIAGNOSIS — R262 Difficulty in walking, not elsewhere classified: Secondary | ICD-10-CM

## 2021-04-02 DIAGNOSIS — M6281 Muscle weakness (generalized): Secondary | ICD-10-CM | POA: Diagnosis not present

## 2021-04-02 DIAGNOSIS — M5442 Lumbago with sciatica, left side: Secondary | ICD-10-CM | POA: Diagnosis not present

## 2021-04-02 NOTE — Therapy (Signed)
[facility] [address] [place], [place], [address] Phone: [phone]   Fax:  [phone]  Physical Therapy Treatment  Patient Details  Name: Jill Dunlap MRN: [id number] Date of Birth: 12-29-1934 Referring Provider (PT): Rosette Reveal, MD   Encounter Date: 04/02/2021   PT End of Session - 04/02/21 1113     Visit Number 4    Number of Visits 17    Date for PT Re-Evaluation 05/16/21    Authorization Type VL: based on auth.    Authorization Time Period Auth 9/1-10/27    Authorization - Visit Number 4    Authorization - Number of Visits 17    Progress Note Due on Visit 10    PT Start Time 1751    PT Stop Time 1159    PT Time Calculation (min) 45 min    Activity Tolerance Patient tolerated treatment well;No increased pain    Behavior During Therapy WFL for tasks assessed/performed             Past Medical History:  Diagnosis Date   Acid reflux    Allergy    Cancer ([place])    skin ca on leg head and back   Celiac disease    Current moderate episode of major depressive disorder without prior episode (HCC)    Gastroesophageal reflux disease without esophagitis    Hyperlipidemia    Hypertension    Hypothyroidism    OP (osteoporosis)    Piriformis syndrome, left 02/12/2021   Pre-diabetes    Primary osteoarthritis of left hip    Type II diabetes mellitus with complication (HCC)    Vertigo    no episodes in several years   Wears dentures    partial lower    Past Surgical History:  Procedure Laterality Date   ABDOMINAL HYSTERECTOMY  1984   for bleeding   Basil cell     BILATERAL SALPINGOOPHORECTOMY  1984   CATARACT EXTRACTION W/PHACO Right 11/24/2016   Procedure: CATARACT EXTRACTION PHACO AND INTRAOCULAR LENS PLACEMENT ([place]) Right;  Surgeon: Leandrew Koyanagi, MD;  Location: [place];  Service: Ophthalmology;  Laterality: Right;   CATARACT EXTRACTION W/PHACO Left 02/04/2017   Procedure: CATARACT  EXTRACTION PHACO AND INTRAOCULAR LENS PLACEMENT ([place])  Left;  Surgeon: Leandrew Koyanagi, MD;  Location: [place];  Service: Ophthalmology;  Laterality: Left;   ORIF WRIST FRACTURE Left 2015   TYMPANOPLASTY  1992    There were no vitals filed for this visit.   Subjective Assessment - 04/02/21 1117     Subjective Patient reports compliance with her HEP. She feels this is better tolerated with modification to intensity of stretching. Patient reports pain affecting L lower leg as well without notable pain in thigh. Patient reports 4-5/10 pain at arrival to PT today. She reports feeling almost pain-free Saturday. She has been using ice/heat at home. She reports no numbness/tingling in lower limb, but states she has this intermittently affecting L lower limb.    Pertinent History Pateint is an 86 year old female with primary complaint of left lower limb pain, left hip pain. Patient reports L lower limb pain down to her L foot with tingling down to her toes. She reports sometimes it feels like it starts in the hip and goes down - sometimes it seems to begin in her lower leg and moves up to hip. She reports one episode of numbness. Hx of Type II DM. Patient reports that she developed PMR in May-June  2021; she states that this is where her issues began. PMR improved with use of prednisone. She is still tapering from prednisone to date. She states that as PMR has improved and then her L lower quarter pain started. She has children that live fairly close but are not readily available always. She states that her most recent condition began around beginning of August. She states that change in footwear didn't help. She denies night pain - sometimes she has difficulty getting to sleep initially. She reports pain with putting weight on LLE in AM. Hx of depression following her husband's death. No bowel/bladder changes. She states that sometimes she thinks bending may help her feel better.    Limitations  Walking;Standing;Sitting;House hold activities    How long can you walk comfortably? Minimal distance when pain is increased    Diagnostic tests Radiograph, see below    Patient Stated Goals Goals: Able to take care of herself without pain              OBJECTIVE FINDINGS  No pain reproduced with peroneal n. lower limb tension test.   Repeated flexion in sitting: During: decreasing peripheral pain/numbness and tingling; After: decreased numbness and tingling       TREATMENT   NuStep x 5 minutes, Level 1; seat 5, arms 8 - unbilled time     Manual Therapy - for symptom modulation, soft tissue sensitivity and mobility, joint mobility, ROM    In prone lying with bolster under distal shins: STM/DTM and IASTM with Hypervolt along L gluteus medius and piriformis/gemelli complex   TPR along L piriformis (reproduces referred pain down LLE)   LLE long-leg distraction in supine, gr II  *not today* STM L distal gastroc     Cold pack (unbilled) utilized following manual therapy for analgesic effect and anti-inflammatory effect along L gluteal region, x5 minutes in prone lying with bolster under distal shins.    Neuromuscular Re-education - for nervous system downregulation, gluteal musculature activation and exercises to promote LE kinetic chain stability   Sidelying Clamshell (lying on R); x20   *not today* Hip ABD and ADD isometrics; 2x10, 3 sec (with belt/ball)       Therapeutic Exercise - HEP establishment to improve carryover of in-clinic intervention, soft tissue mobility/extensibility, dural/neural mobility, repeated movement for symptom modulation/decreasing peripheral symptoms   Lower trunk rotations,supine; x10 ea dir   Supine piriformis stretch; 2x30sec   Repeated lumbar flexion in sitting; x10   HEP update and review     *not today* Seated sciatic glider; 2x10            ASSESSMENT Patient has minimal symptoms in lying following manual therapy  (STM/IASTM/TPR) along deep hip external rotators, but has rapid return of L hip pain and paresthesias upon standing. Paresthesias are decreased with seated lumbar flexion. Pt has historically felt relief with "bending over"/trunk flexion. Initiating trial of repeated flexion at this time to assess for reduction of potential lumbar derangement versus treating purely for peripheral entrapment/deep gluteal syndrome. Pt has moderate level of pain at this time versus high levels reported at 1st-2nd visits, but she still has significant pain in the AM and is limited with community-level gait due to L hip/L lower limb pain. Patient has remaining deficits in thoracolumbar ROM, gluteal soft tissue mobility, decreased hip flexor/gluteal/quad and hamstrings strength LLE, gait changes/dec stance time LLE, and L gluteal pain with LLE referral to L lower leg. Patient will benefit from continued skilled therapeutic intervention to address the  above deficits as needed for improved function and QoL.                PT Short Term Goals - 03/21/21 1219       PT SHORT TERM GOAL #1   Title Pt will be independent and 100% compliant with established HEP and activity modification as needed to augment PT intervention and prevent flare-up of back pain as needed for best return to prior level of function.    Baseline 03/21/21: Baseline HEP initiated    Time 3    Period Weeks    Status New    Target Date 04/11/21               PT Long Term Goals - 03/21/21 1218       PT LONG TERM GOAL #1   Title Patient will demonstrate improved function as evidenced by a score of 68 on FOTO measure for full participation in activities at home and in the community.    Baseline 03/21/21: 55    Time 8    Period Weeks    Status New    Target Date 05/16/21      PT LONG TERM GOAL #2   Title Patient will have full thoracolumbar AROM without reproduction of pain as needed for functional reaching, self-care ADLs, bed mobility,  household chores.    Baseline 03/21/21: Pain and motion loss with extension and R rotation, pain with L rotation.    Time 8    Period Weeks    Status New    Target Date 05/16/21      PT LONG TERM GOAL #3   Title Patient will have MMT for all tested musculature 4+/5 or greater without reproduction of pain indicative of improved capacity for loading paraspinal/pelvic and gluteal mm as needed for performance of transferring, household chores, prolonged weightbearing tasks    Baseline 03/21/21: Hip flexion 3+, Hip ABD 4, Quad 4-, Hamtrings 3+    Time 8    Period Weeks    Status New    Target Date 05/16/21      PT LONG TERM GOAL #4   Title Patient will be able to ambulate for home-mobility distance (150 ft or greater) with no increase in pain > 1-2/10 as needed for accessing her home    Baseline 03/21/21: Difficulty with walking home-mobility distance due to L lower limb pain    Time 8    Period Weeks    Status New    Target Date 05/16/21                   Plan - 04/03/21 0750     Clinical Impression Statement Patient has minimal symptoms in lying following manual therapy (STM/IASTM/TPR) along deep hip external rotators, but has rapid return of L hip pain and paresthesias upon standing. Paresthesias are decreased with seated lumbar flexion. Pt has historically felt relief with "bending over"/trunk flexion. Initiating trial of repeated flexion at this time to assess for reduction of potential lumbar derangement versus treating purely for peripheral entrapment/deep gluteal syndrome. Pt has moderate level of pain at this time versus high levels reported at 1st-2nd visits, but she still has significant pain in the AM and is limited with community-level gait due to L hip/L lower limb pain. Patient has remaining deficits in thoracolumbar ROM, gluteal soft tissue mobility, decreased hip flexor/gluteal/quad and hamstrings strength LLE, gait changes/dec stance time LLE, and L gluteal pain with LLE  referral to L lower  leg. Patient will benefit from continued skilled therapeutic intervention to address the above deficits as needed for improved function and QoL.    Personal Factors and Comorbidities Age;Comorbidity 3+    Comorbidities Osteoporosis, Type II DM, depression, celiac disease, GERD, Hx of skin cancer    Examination-Activity Limitations Locomotion Level;Sit;Stand;Stairs;Transfers    Examination-Participation Restrictions Church;Community Activity    Stability/Clinical Decision Making Evolving/Moderate complexity    Rehab Potential Good    PT Frequency 2x / week    PT Duration 8 weeks    PT Treatment/Interventions Cryotherapy;Electrical Stimulation;Moist Heat;Therapeutic activities;Therapeutic exercise;Neuromuscular re-education;Manual techniques;Dry needling    PT Next Visit Plan Manual therapy and neurodynamics to improve L buttock and LE referred symptoms, isometrics and gentle mobility with with gradual progression to gluteal strengthening.    PT Home Exercise Plan Access Code [email or web address]    Consulted and Agree with Plan of Care Patient             Patient will benefit from skilled therapeutic intervention in order to improve the following deficits and impairments:  Abnormal gait, Decreased strength, Impaired sensation, Pain, Decreased range of motion, Impaired flexibility, Postural dysfunction  Visit Diagnosis: Pain in left hip  Left-sided low back pain with left-sided sciatica, unspecified chronicity  Difficulty in walking, not elsewhere classified  Muscle weakness (generalized)     Problem List Patient Active Problem List   Diagnosis Date Noted   Left lumbar radiculopathy 03/13/2021   Piriformis syndrome, left 02/12/2021   Aortic atherosclerosis ([place]) 08/01/2020   Disorder of bursae of shoulder region 02/02/2020   PMR (polymyalgia rheumatica) ([place]) 02/02/2020   Primary osteoarthritis of left hip 01/24/2020   Recurrent major depression in partial remission  ([place]) 12/28/2019   Diffuse photodamage of skin 02/13/2016   Basal cell carcinoma 01/11/2016   Gastroesophageal reflux disease without esophagitis 07/03/2015   Type II diabetes mellitus with complication ([place]) 62/26/3335   Acquired hypothyroidism 10/31/2014   Allergic rhinitis 10/31/2014   Benign hypertension 10/31/2014   CD (celiac disease) 10/31/2014   Dyslipidemia 10/31/2014   OP (osteoporosis) 10/31/2014   Valentina Gu, PT, DPT #[id number]  Eilleen Kempf, PT 04/03/2021, 7:50 AM  [facility] [address]. [place], [place], [address] Phone: [phone]   Fax:  [phone]  Name: Jill Dunlap MRN: [id number] Date of Birth: 09-Oct-1934

## 2021-04-04 ENCOUNTER — Other Ambulatory Visit: Payer: Self-pay

## 2021-04-04 ENCOUNTER — Encounter: Payer: Self-pay | Admitting: Physical Therapy

## 2021-04-04 ENCOUNTER — Ambulatory Visit: Payer: Medicare PPO | Admitting: Physical Therapy

## 2021-04-04 DIAGNOSIS — M6281 Muscle weakness (generalized): Secondary | ICD-10-CM

## 2021-04-04 DIAGNOSIS — M25552 Pain in left hip: Secondary | ICD-10-CM | POA: Diagnosis not present

## 2021-04-04 DIAGNOSIS — R262 Difficulty in walking, not elsewhere classified: Secondary | ICD-10-CM

## 2021-04-04 DIAGNOSIS — M5442 Lumbago with sciatica, left side: Secondary | ICD-10-CM | POA: Diagnosis not present

## 2021-04-04 NOTE — Therapy (Signed)
[facility] [address] [place], [place], [address] Phone: [phone]   Fax:  [phone]  Physical Therapy Treatment  Patient Details  Name: Jill Dunlap MRN: [id number] Date of Birth: 1935/01/16 Referring Provider (PT): Rosette Reveal, MD   Encounter Date: 04/04/2021   PT End of Session - 04/05/21 0616     Visit Number 5    Number of Visits 17    Date for PT Re-Evaluation 05/16/21    Authorization Type VL: based on auth.    Authorization Time Period Auth 9/1-10/27    Authorization - Visit Number 5    Authorization - Number of Visits 17    Progress Note Due on Visit 10    PT Start Time 8546    PT Stop Time 1115    PT Time Calculation (min) 44 min    Activity Tolerance Patient tolerated treatment well;No increased pain    Behavior During Therapy WFL for tasks assessed/performed             Past Medical History:  Diagnosis Date   Acid reflux    Allergy    Cancer ([place])    skin ca on leg head and back   Celiac disease    Current moderate episode of major depressive disorder without prior episode (HCC)    Gastroesophageal reflux disease without esophagitis    Hyperlipidemia    Hypertension    Hypothyroidism    OP (osteoporosis)    Piriformis syndrome, left 02/12/2021   Pre-diabetes    Primary osteoarthritis of left hip    Type II diabetes mellitus with complication (HCC)    Vertigo    no episodes in several years   Wears dentures    partial lower    Past Surgical History:  Procedure Laterality Date   ABDOMINAL HYSTERECTOMY  1984   for bleeding   Basil cell     BILATERAL SALPINGOOPHORECTOMY  1984   CATARACT EXTRACTION W/PHACO Right 11/24/2016   Procedure: CATARACT EXTRACTION PHACO AND INTRAOCULAR LENS PLACEMENT ([place]) Right;  Surgeon: Leandrew Koyanagi, MD;  Location: [place];  Service: Ophthalmology;  Laterality: Right;   CATARACT EXTRACTION W/PHACO Left 02/04/2017   Procedure: CATARACT  EXTRACTION PHACO AND INTRAOCULAR LENS PLACEMENT ([place])  Left;  Surgeon: Leandrew Koyanagi, MD;  Location: [place];  Service: Ophthalmology;  Laterality: Left;   ORIF WRIST FRACTURE Left 2015   TYMPANOPLASTY  1992    There were no vitals filed for this visit.   Subjective Assessment - 04/04/21 1038     Subjective Patient reports having good day yesterday. She reports using Tylenol less - only taking "at bedtime." Patient reports it is still rough in the AM, but not as bad this morning. Patient reports compliance with updated HEP including repeated lumbar flexion in sitting. Pt reports pain is more in L gluteal region. She states it is "not painful" at arrival/minimal symptoms.    Pertinent History Pateint is an 86 year old female with primary complaint of left lower limb pain, left hip pain. Patient reports L lower limb pain down to her L foot with tingling down to her toes. She reports sometimes it feels like it starts in the hip and goes down - sometimes it seems to begin in her lower leg and moves up to hip. She reports one episode of numbness. Hx of Type II DM. Patient reports that she developed PMR in May-June 2021; she states that this is where her issues began.  PMR improved with use of prednisone. She is still tapering from prednisone to date. She states that as PMR has improved and then her L lower quarter pain started. She has children that live fairly close but are not readily available always. She states that her most recent condition began around beginning of August. She states that change in footwear didn't help. She denies night pain - sometimes she has difficulty getting to sleep initially. She reports pain with putting weight on LLE in AM. Hx of depression following her husband's death. No bowel/bladder changes. She states that sometimes she thinks bending may help her feel better.    Limitations Walking;Standing;Sitting;House hold activities    How long can you walk  comfortably? Minimal distance when pain is increased    Diagnostic tests Radiograph, see below    Patient Stated Goals Goals: Able to take care of herself without pain               OBJECTIVE FINDINGS    Repeated flexion in sitting: During: decreasing peripheral pain/numbness and tingling; After: decreased numbness and tingling/peripheral symptoms       TREATMENT   NuStep x 5 minutes, Level 1; seat 5, arms 8 - unbilled time     Manual Therapy - for symptom modulation, soft tissue sensitivity and mobility, joint mobility, ROM    Sidelying R: STM/DTM and IASTM with Hypervolt along L gluteus medius and piriformis/gemelli complex   TPR along L piriformis (reproduces referred pain down LLE)   LLE long-leg distraction in supine, gr II     *not today* STM L distal gastroc         Neuromuscular Re-education - for nervous system downregulation, gluteal musculature activation and exercises to promote LE kinetic chain stability   Sidelying Clamshell (lying on R); x20  Glute Bridge, supine; 2x5 (fleeting L gluteal pain)   *not today* Hip ABD and ADD isometrics; 2x10, 3 sec (with belt/ball)       Therapeutic Exercise - HEP establishment to improve carryover of in-clinic intervention, soft tissue mobility/extensibility, dural/neural mobility, repeated movement for symptom modulation/decreasing peripheral symptoms  Repeated lumbar flexion in sitting; x10    Lower trunk rotations,supine; x10 ea dir    Supine piriformis stretch; 2x30sec   Walking trial in clinic; ambulate 1 lap in gym      *not today* Seated sciatic glider; 2x10        Cold pack (unbilled) utilized at end of sesion for analgesic effect and anti-inflammatory effect along L gluteal region, x5 minutes in R sidelying.       ASSESSMENT Patient has mild symptoms this AM with recent update to her HEP. She still has recurrence of pain with walking, but peripheral symptoms are diminished with use of  repeated flexion in sitting (lumbar flexion to end-range in sitting x 10). Peripheral symptoms can be reproduced with deep gluteal palpation as well. This is consistent with referring physician's report of primary chronic lumbar etiology with compensatory piriformis involvement. Pt tolerates clamshells well with no notable pain, but she does have fleeting gluteal discomfort with mild referral pattern with initiation of gluteal bridging. Will need to progress slowly with graded loading of hip ABD/ER. Patient has remaining deficits in thoracolumbar ROM, gluteal soft tissue mobility, decreased hip flexor/gluteal/quad and hamstrings strength LLE, gait changes/dec stance time LLE, and L gluteal pain with LLE referral to L lower leg. Patient will benefit from continued skilled therapeutic intervention to address the above deficits as needed for improved function and QoL.  PT Short Term Goals - 03/21/21 1219       PT SHORT TERM GOAL #1   Title Pt will be independent and 100% compliant with established HEP and activity modification as needed to augment PT intervention and prevent flare-up of back pain as needed for best return to prior level of function.    Baseline 03/21/21: Baseline HEP initiated    Time 3    Period Weeks    Status New    Target Date 04/11/21               PT Long Term Goals - 03/21/21 1218       PT LONG TERM GOAL #1   Title Patient will demonstrate improved function as evidenced by a score of 68 on FOTO measure for full participation in activities at home and in the community.    Baseline 03/21/21: 55    Time 8    Period Weeks    Status New    Target Date 05/16/21      PT LONG TERM GOAL #2   Title Patient will have full thoracolumbar AROM without reproduction of pain as needed for functional reaching, self-care ADLs, bed mobility, household chores.    Baseline 03/21/21: Pain and motion loss with extension and R rotation, pain with L rotation.    Time 8     Period Weeks    Status New    Target Date 05/16/21      PT LONG TERM GOAL #3   Title Patient will have MMT for all tested musculature 4+/5 or greater without reproduction of pain indicative of improved capacity for loading paraspinal/pelvic and gluteal mm as needed for performance of transferring, household chores, prolonged weightbearing tasks    Baseline 03/21/21: Hip flexion 3+, Hip ABD 4, Quad 4-, Hamtrings 3+    Time 8    Period Weeks    Status New    Target Date 05/16/21      PT LONG TERM GOAL #4   Title Patient will be able to ambulate for home-mobility distance (150 ft or greater) with no increase in pain > 1-2/10 as needed for accessing her home    Baseline 03/21/21: Difficulty with walking home-mobility distance due to L lower limb pain    Time 8    Period Weeks    Status New    Target Date 05/16/21                   Plan - 04/05/21 9604     Clinical Impression Statement Patient has mild symptoms this AM with recent update to her HEP. She still has recurrence of pain with walking, but peripheral symptoms are diminished with use of repeated flexion in sitting (lumbar flexion to end-range in sitting x 10). Peripheral symptoms can be reproduced with deep gluteal palpation as well. This is consistent with referring physician's report of primary chronic lumbar etiology with compensatory piriformis involvement. Pt tolerates clamshells well with no notable pain, but she does have fleeting gluteal discomfort with mild referral pattern with initiation of gluteal bridging. Will need to progress slowly with graded loading of hip ABD/ER. Patient has remaining deficits in thoracolumbar ROM, gluteal soft tissue mobility, decreased hip flexor/gluteal/quad and hamstrings strength LLE, gait changes/dec stance time LLE, and L gluteal pain with LLE referral to L lower leg. Patient will benefit from continued skilled therapeutic intervention to address the above deficits as needed for improved  function and QoL.    Personal Factors and  Comorbidities Age;Comorbidity 3+    Comorbidities Osteoporosis, Type II DM, depression, celiac disease, GERD, Hx of skin cancer    Examination-Activity Limitations Locomotion Level;Sit;Stand;Stairs;Transfers    Examination-Participation Restrictions Church;Community Activity    Stability/Clinical Decision Making Evolving/Moderate complexity    Rehab Potential Good    PT Frequency 2x / week    PT Duration 8 weeks    PT Treatment/Interventions Cryotherapy;Electrical Stimulation;Moist Heat;Therapeutic activities;Therapeutic exercise;Neuromuscular re-education;Manual techniques;Dry needling    PT Next Visit Plan Manual therapy and neurodynamics to improve L buttock and LE referred symptoms, isometrics and gentle mobility with with gradual progression to gluteal strengthening.    PT Home Exercise Plan Access Code [email or web address]    Consulted and Agree with Plan of Care Patient             Patient will benefit from skilled therapeutic intervention in order to improve the following deficits and impairments:  Abnormal gait, Decreased strength, Impaired sensation, Pain, Decreased range of motion, Impaired flexibility, Postural dysfunction  Visit Diagnosis: Pain in left hip  Left-sided low back pain with left-sided sciatica, unspecified chronicity  Difficulty in walking, not elsewhere classified  Muscle weakness (generalized)     Problem List Patient Active Problem List   Diagnosis Date Noted   Left lumbar radiculopathy 03/13/2021   Piriformis syndrome, left 02/12/2021   Aortic atherosclerosis ([place]) 08/01/2020   Disorder of bursae of shoulder region 02/02/2020   PMR (polymyalgia rheumatica) ([place]) 02/02/2020   Primary osteoarthritis of left hip 01/24/2020   Recurrent major depression in partial remission ([place]) 12/28/2019   Diffuse photodamage of skin 02/13/2016   Basal cell carcinoma 01/11/2016   Gastroesophageal reflux disease without esophagitis  07/03/2015   Type II diabetes mellitus with complication ([place]) 78/67/6720   Acquired hypothyroidism 10/31/2014   Allergic rhinitis 10/31/2014   Benign hypertension 10/31/2014   CD (celiac disease) 10/31/2014   Dyslipidemia 10/31/2014   OP (osteoporosis) 10/31/2014   Valentina Gu, PT, DPT #[id number]  Eilleen Kempf, PT 04/05/2021, 6:26 AM  [place] [facility] [address] [place], [place], [address] Phone: [phone]   Fax:  [phone]  Name: Jill Dunlap MRN: [id number] Date of Birth: Mar 11, 1935

## 2021-04-09 ENCOUNTER — Ambulatory Visit: Payer: Medicare PPO | Admitting: Physical Therapy

## 2021-04-09 ENCOUNTER — Other Ambulatory Visit: Payer: Self-pay

## 2021-04-09 ENCOUNTER — Encounter: Payer: Self-pay | Admitting: Physical Therapy

## 2021-04-09 DIAGNOSIS — M6281 Muscle weakness (generalized): Secondary | ICD-10-CM | POA: Diagnosis not present

## 2021-04-09 DIAGNOSIS — R262 Difficulty in walking, not elsewhere classified: Secondary | ICD-10-CM

## 2021-04-09 DIAGNOSIS — M25552 Pain in left hip: Secondary | ICD-10-CM

## 2021-04-09 DIAGNOSIS — M5442 Lumbago with sciatica, left side: Secondary | ICD-10-CM | POA: Diagnosis not present

## 2021-04-09 NOTE — Therapy (Signed)
[facility] [address] [place], [place], [address] Phone: [phone]   Fax:  [phone]  Physical Therapy Treatment  Patient Details  Name: Jill Dunlap MRN: [id number] Date of Birth: 06-08-1935 Referring Provider (PT): Rosette Reveal, MD   Encounter Date: 04/09/2021   PT End of Session - 04/09/21 1226     Visit Number 6    Number of Visits 17    Date for PT Re-Evaluation 05/16/21    Authorization Type VL: based on auth.    Authorization Time Period Auth 9/1-10/27    Authorization - Visit Number 5    Authorization - Number of Visits 17    Progress Note Due on Visit 10    PT Start Time 1033    PT Stop Time 1114    PT Time Calculation (min) 41 min    Activity Tolerance Patient tolerated treatment well    Behavior During Therapy WFL for tasks assessed/performed             Past Medical History:  Diagnosis Date   Acid reflux    Allergy    Cancer ([place])    skin ca on leg head and back   Celiac disease    Current moderate episode of major depressive disorder without prior episode (HCC)    Gastroesophageal reflux disease without esophagitis    Hyperlipidemia    Hypertension    Hypothyroidism    OP (osteoporosis)    Piriformis syndrome, left 02/12/2021   Pre-diabetes    Primary osteoarthritis of left hip    Type II diabetes mellitus with complication (HCC)    Vertigo    no episodes in several years   Wears dentures    partial lower    Past Surgical History:  Procedure Laterality Date   ABDOMINAL HYSTERECTOMY  1984   for bleeding   Basil cell     BILATERAL SALPINGOOPHORECTOMY  1984   CATARACT EXTRACTION W/PHACO Right 11/24/2016   Procedure: CATARACT EXTRACTION PHACO AND INTRAOCULAR LENS PLACEMENT ([place]) Right;  Surgeon: Leandrew Koyanagi, MD;  Location: [place];  Service: Ophthalmology;  Laterality: Right;   CATARACT EXTRACTION W/PHACO Left 02/04/2017   Procedure: CATARACT EXTRACTION PHACO  AND INTRAOCULAR LENS PLACEMENT ([place])  Left;  Surgeon: Leandrew Koyanagi, MD;  Location: [place];  Service: Ophthalmology;  Laterality: Left;   ORIF WRIST FRACTURE Left 2015   TYMPANOPLASTY  1992    There were no vitals filed for this visit.   Subjective Assessment - 04/09/21 1036     Subjective Pt states today has been a "pretty good day". She reports compliance with HEP, no questions or concerns at this time. She endorses 2/10 pain in L hip and lateral lower leg.    Pertinent History Pateint is an 86 year old female with primary complaint of left lower limb pain, left hip pain. Patient reports L lower limb pain down to her L foot with tingling down to her toes. She reports sometimes it feels like it starts in the hip and goes down - sometimes it seems to begin in her lower leg and moves up to hip. She reports one episode of numbness. Hx of Type II DM. Patient reports that she developed PMR in May-June 2021; she states that this is where her issues began. PMR improved with use of prednisone. She is still tapering from prednisone to date. She states that as PMR has improved and then her L lower quarter pain started. She has  children that live fairly close but are not readily available always. She states that her most recent condition began around beginning of August. She states that change in footwear didn't help. She denies night pain - sometimes she has difficulty getting to sleep initially. She reports pain with putting weight on LLE in AM. Hx of depression following her husband's death. No bowel/bladder changes. She states that sometimes she thinks bending may help her feel better.    Limitations Walking;Standing;Sitting;House hold activities    How long can you walk comfortably? Minimal distance when pain is increased    Diagnostic tests Radiograph, see below    Patient Stated Goals Goals: Able to take care of herself without pain    Currently in Pain? Yes    Pain Score 2     Pain  Location Hip    Pain Orientation Left                TREATMENT     Manual Therapy - for symptom modulation, soft tissue sensitivity and mobility, joint mobility, ROM    Sidelying R: STM/DTM and IASTM with Hypervolt along L gluteus medius and piriformis/gemelli complex   TPR along L piriformis (reproduces referred pain down LLE)   LLE long-leg distraction in supine, gr II   STM L lateral gastroc          Neuromuscular Re-education - for nervous system downregulation, gluteal musculature activation and exercises to promote LE kinetic chain stability   Sidelying Clamshell (lying on R); x20   Glute Bridge, supine; 2x5 (fleeting L gluteal pain)   Standing hip abduction with UE support; 2x10 each LE    *not today* Hip ABD and ADD isometrics; 2x10, 3 sec (with belt/ball)       Therapeutic Exercise - HEP establishment to improve carryover of in-clinic intervention, soft tissue mobility/extensibility, dural/neural mobility, repeated movement for symptom modulation/decreasing peripheral symptoms   Repeated lumbar flexion in sitting; x10     Lower trunk rotations,supine; x10 ea dir    Supine piriformis stretch; 2x30sec each LE  Mini squats in // bars for light UE support; x10      *not today* Seated sciatic glider; 2x10        *not today* Cold pack (unbilled) utilized at end of sesion for analgesic effect and anti-inflammatory effect along L gluteal region, x5 minutes in R sidelying.       ASSESSMENT Patient demonstrates excellent motivation throughout PT session. Peripheral symptoms continue to be reproduced with deep gluteal palpation. Patient performed all supine and side lying exercise with no pain, including gluteal bridges. PT initiated additional hip and LE strengthening in standing with mild pain reported by patient. PT to modify and progress ABD/ER slowly with graded loading. Patient has remaining deficits in thoracolumbar ROM, gluteal soft tissue  mobility, decreased hip flexor/gluteal/quad and hamstrings strength LLE, gait changes/dec stance time LLE, and L gluteal pain with LLE referral to L lower leg. Patient will benefit from continued skilled therapeutic intervention to address the above deficits as needed for improved function and QoL.                   PT Short Term Goals - 03/21/21 1219       PT SHORT TERM GOAL #1   Title Pt will be independent and 100% compliant with established HEP and activity modification as needed to augment PT intervention and prevent flare-up of back pain as needed for best return to prior level of function.  Baseline 03/21/21: Baseline HEP initiated    Time 3    Period Weeks    Status New    Target Date 04/11/21               PT Long Term Goals - 03/21/21 1218       PT LONG TERM GOAL #1   Title Patient will demonstrate improved function as evidenced by a score of 68 on FOTO measure for full participation in activities at home and in the community.    Baseline 03/21/21: 55    Time 8    Period Weeks    Status New    Target Date 05/16/21      PT LONG TERM GOAL #2   Title Patient will have full thoracolumbar AROM without reproduction of pain as needed for functional reaching, self-care ADLs, bed mobility, household chores.    Baseline 03/21/21: Pain and motion loss with extension and R rotation, pain with L rotation.    Time 8    Period Weeks    Status New    Target Date 05/16/21      PT LONG TERM GOAL #3   Title Patient will have MMT for all tested musculature 4+/5 or greater without reproduction of pain indicative of improved capacity for loading paraspinal/pelvic and gluteal mm as needed for performance of transferring, household chores, prolonged weightbearing tasks    Baseline 03/21/21: Hip flexion 3+, Hip ABD 4, Quad 4-, Hamtrings 3+    Time 8    Period Weeks    Status New    Target Date 05/16/21      PT LONG TERM GOAL #4   Title Patient will be able to ambulate for  home-mobility distance (150 ft or greater) with no increase in pain > 1-2/10 as needed for accessing her home    Baseline 03/21/21: Difficulty with walking home-mobility distance due to L lower limb pain    Time 8    Period Weeks    Status New    Target Date 05/16/21                   Plan - 04/09/21 1512     Clinical Impression Statement Patient demonstrates excellent motivation throughout PT session. Peripheral symptoms continue to be reproduced with deep gluteal palpation. Patient performed all supine and side lying exercise with no pain, including gluteal bridges. PT initiated additional hip and LE strengthening in standing with mild pain reported by patient. PT to modify and progress ABD/ER slowly with graded loading. Patient has remaining deficits in thoracolumbar ROM, gluteal soft tissue mobility, decreased hip flexor/gluteal/quad and hamstrings strength LLE, gait changes/dec stance time LLE, and L gluteal pain with LLE referral to L lower leg. Patient will benefit from continued skilled therapeutic intervention to address the above deficits as needed for improved function and QoL.    Personal Factors and Comorbidities Age;Comorbidity 3+    Comorbidities Osteoporosis, Type II DM, depression, celiac disease, GERD, Hx of skin cancer    Examination-Activity Limitations Locomotion Level;Sit;Stand;Stairs;Transfers    Examination-Participation Restrictions Church;Community Activity    Stability/Clinical Decision Making Evolving/Moderate complexity    Rehab Potential Good    PT Frequency 2x / week    PT Duration 8 weeks    PT Treatment/Interventions Cryotherapy;Electrical Stimulation;Moist Heat;Therapeutic activities;Therapeutic exercise;Neuromuscular re-education;Manual techniques;Dry needling    PT Next Visit Plan Manual therapy and neurodynamics to improve L buttock and LE referred symptoms, isometrics and gentle mobility with with gradual progression to gluteal strengthening.  PT  Home Exercise Plan Access Code [email or web address]    Consulted and Agree with Plan of Care Patient             Patient will benefit from skilled therapeutic intervention in order to improve the following deficits and impairments:  Abnormal gait, Decreased strength, Impaired sensation, Pain, Decreased range of motion, Impaired flexibility, Postural dysfunction  Visit Diagnosis: Pain in left hip  Left-sided low back pain with left-sided sciatica, unspecified chronicity  Difficulty in walking, not elsewhere classified  Muscle weakness (generalized)     Problem List Patient Active Problem List   Diagnosis Date Noted   Left lumbar radiculopathy 03/13/2021   Piriformis syndrome, left 02/12/2021   Aortic atherosclerosis ([place]) 08/01/2020   Disorder of bursae of shoulder region 02/02/2020   PMR (polymyalgia rheumatica) ([place]) 02/02/2020   Primary osteoarthritis of left hip 01/24/2020   Recurrent major depression in partial remission ([place]) 12/28/2019   Diffuse photodamage of skin 02/13/2016   Basal cell carcinoma 01/11/2016   Gastroesophageal reflux disease without esophagitis 07/03/2015   Type II diabetes mellitus with complication ([place]) 95/28/4132   Acquired hypothyroidism 10/31/2014   Allergic rhinitis 10/31/2014   Benign hypertension 10/31/2014   CD (celiac disease) 10/31/2014   Dyslipidemia 10/31/2014   OP (osteoporosis) 10/31/2014    Patrina Levering PT, DPT  Ramonita Lab, PT 04/09/2021, 3:23 PM  [facility] [address] [place], [place], [address] Phone: [phone]   Fax:  [phone]  Name: Jill Dunlap MRN: [id number] Date of Birth: [birth year]

## 2021-04-11 ENCOUNTER — Ambulatory Visit: Payer: Medicare PPO

## 2021-04-11 ENCOUNTER — Other Ambulatory Visit: Payer: Self-pay

## 2021-04-11 DIAGNOSIS — R262 Difficulty in walking, not elsewhere classified: Secondary | ICD-10-CM | POA: Diagnosis not present

## 2021-04-11 DIAGNOSIS — M5442 Lumbago with sciatica, left side: Secondary | ICD-10-CM | POA: Diagnosis not present

## 2021-04-11 DIAGNOSIS — M25552 Pain in left hip: Secondary | ICD-10-CM

## 2021-04-11 DIAGNOSIS — M6281 Muscle weakness (generalized): Secondary | ICD-10-CM

## 2021-04-11 NOTE — Therapy (Signed)
[facility] [address] [place], [place], [address] Phone: [phone]   Fax:  [phone]  Physical Therapy Treatment  Patient Details  Name: Jill Dunlap MRN: [id number] Date of Birth: 1935-06-25 Referring Provider (PT): Rosette Reveal, MD   Encounter Date: 04/11/2021   PT End of Session - 04/11/21 1012     Visit Number 7    Number of Visits 17    Date for PT Re-Evaluation 05/16/21    Authorization Type VL: based on auth.    Authorization Time Period Auth 9/1-10/27    Authorization - Visit Number 6    Authorization - Number of Visits 17    Progress Note Due on Visit 10    PT Start Time 0945    PT Stop Time 1030    PT Time Calculation (min) 45 min    Activity Tolerance Patient tolerated treatment well    Behavior During Therapy WFL for tasks assessed/performed             Past Medical History:  Diagnosis Date   Acid reflux    Allergy    Cancer ([place])    skin ca on leg head and back   Celiac disease    Current moderate episode of major depressive disorder without prior episode (HCC)    Gastroesophageal reflux disease without esophagitis    Hyperlipidemia    Hypertension    Hypothyroidism    OP (osteoporosis)    Piriformis syndrome, left 02/12/2021   Pre-diabetes    Primary osteoarthritis of left hip    Type II diabetes mellitus with complication (HCC)    Vertigo    no episodes in several years   Wears dentures    partial lower    Past Surgical History:  Procedure Laterality Date   ABDOMINAL HYSTERECTOMY  1984   for bleeding   Basil cell     BILATERAL SALPINGOOPHORECTOMY  1984   CATARACT EXTRACTION W/PHACO Right 11/24/2016   Procedure: CATARACT EXTRACTION PHACO AND INTRAOCULAR LENS PLACEMENT ([place]) Right;  Surgeon: Leandrew Koyanagi, MD;  Location: [place];  Service: Ophthalmology;  Laterality: Right;   CATARACT EXTRACTION W/PHACO Left 02/04/2017   Procedure: CATARACT EXTRACTION PHACO  AND INTRAOCULAR LENS PLACEMENT ([place])  Left;  Surgeon: Leandrew Koyanagi, MD;  Location: [place];  Service: Ophthalmology;  Laterality: Left;   ORIF WRIST FRACTURE Left 2015   TYMPANOPLASTY  1992    There were no vitals filed for this visit.   Subjective Assessment - 04/11/21 0945     Subjective Pt states she is feeling pretty good again this morning.  She did not bring her cane into PT which she feels is a good sign.  She reports her pain is 3/10 in her L LE and lateral lower leg.    Pertinent History Pateint is an 86 year old female with primary complaint of left lower limb pain, left hip pain. Patient reports L lower limb pain down to her L foot with tingling down to her toes. She reports sometimes it feels like it starts in the hip and goes down - sometimes it seems to begin in her lower leg and moves up to hip. She reports one episode of numbness. Hx of Type II DM. Patient reports that she developed PMR in May-June 2021; she states that this is where her issues began. PMR improved with use of prednisone. She is still tapering from prednisone to date. She states that as PMR has improved and  then her L lower quarter pain started. She has children that live fairly close but are not readily available always. She states that her most recent condition began around beginning of August. She states that change in footwear didn't help. She denies night pain - sometimes she has difficulty getting to sleep initially. She reports pain with putting weight on LLE in AM. Hx of depression following her husband's death. No bowel/bladder changes. She states that sometimes she thinks bending may help her feel better.    Limitations Walking;Standing;Sitting;House hold activities    How long can you walk comfortably? Minimal distance when pain is increased    Diagnostic tests Radiograph, see below    Patient Stated Goals Goals: Able to take care of herself without pain    Pain Score 3     Pain Location  Hip    Pain Orientation Left               TREATMENT   NuStep x 5 minutes, Level 1; seat 5, arms 8 - unbilled time     Manual Therapy - for symptom modulation, soft tissue sensitivity and mobility, joint mobility, ROM    Sidelying R: STM/DTM and IASTM with Hypervolt along L gluteus medius and piriformis/gemelli complex   TPR along L piriformis (reproduces referred pain down LLE)   LLE long-leg distraction in supine, gr II   *not today* STM L distal gastroc     Neuromuscular Re-education - for nervous system downregulation, gluteal musculature activation and exercises to promote LE kinetic chain stability   Sidelying Clamshell (lying on R); x20 Glute Bridge, supine; 2x5 (fleeting L gluteal pain) Hip ABD and ADD isometrics; 2x10, 3 sec (with PT manual resistance in supine hooklying today)     Therapeutic Exercise - HEP establishment to improve carryover of in-clinic intervention, soft tissue mobility/extensibility, dural/neural mobility, repeated movement for symptom modulation/decreasing peripheral symptoms   Repeated lumbar flexion in sitting; x10     Lower trunk rotations,supine; x10 ea dir    Supine piriformis stretch; 2x30sec    Walking trial in clinic; ambulate 1 lap in gym      *not today* Seated sciatic glider; 2x10     Cold pack (unbilled) utilized at end of sesion for analgesic effect and anti-inflammatory effect along L gluteal region, x5 minutes in R sidelying- not today       ASSESSMENT See clinical impression statement          PT Short Term Goals - 03/21/21 1219       PT SHORT TERM GOAL #1   Title Pt will be independent and 100% compliant with established HEP and activity modification as needed to augment PT intervention and prevent flare-up of back pain as needed for best return to prior level of function.    Baseline 03/21/21: Baseline HEP initiated    Time 3    Period Weeks    Status New    Target Date 04/11/21                PT Long Term Goals - 03/21/21 1218       PT LONG TERM GOAL #1   Title Patient will demonstrate improved function as evidenced by a score of 68 on FOTO measure for full participation in activities at home and in the community.    Baseline 03/21/21: 55    Time 8    Period Weeks    Status New    Target Date 05/16/21  PT LONG TERM GOAL #2   Title Patient will have full thoracolumbar AROM without reproduction of pain as needed for functional reaching, self-care ADLs, bed mobility, household chores.    Baseline 03/21/21: Pain and motion loss with extension and R rotation, pain with L rotation.    Time 8    Period Weeks    Status New    Target Date 05/16/21      PT LONG TERM GOAL #3   Title Patient will have MMT for all tested musculature 4+/5 or greater without reproduction of pain indicative of improved capacity for loading paraspinal/pelvic and gluteal mm as needed for performance of transferring, household chores, prolonged weightbearing tasks    Baseline 03/21/21: Hip flexion 3+, Hip ABD 4, Quad 4-, Hamtrings 3+    Time 8    Period Weeks    Status New    Target Date 05/16/21      PT LONG TERM GOAL #4   Title Patient will be able to ambulate for home-mobility distance (150 ft or greater) with no increase in pain > 1-2/10 as needed for accessing her home    Baseline 03/21/21: Difficulty with walking home-mobility distance due to L lower limb pain    Time 8    Period Weeks    Status New    Target Date 05/16/21                   Plan - 04/11/21 1013     Clinical Impression Statement Pt had short term relief of distal L LE sx within session after manual L LE traction and seated lumbar flexion.  She demonstrated good control with her bridges and clam shell exercises. L LE mm contraction was weaker than R during manual hip abd/add isometrics in supine hooklying. Overall she tolerated session well and should continue to benefit from current PT POC.    Personal Factors and  Comorbidities Age;Comorbidity 3+    Comorbidities Osteoporosis, Type II DM, depression, celiac disease, GERD, Hx of skin cancer    Examination-Activity Limitations Locomotion Level;Sit;Stand;Stairs;Transfers    Examination-Participation Restrictions Church;Community Activity    Stability/Clinical Decision Making Evolving/Moderate complexity    Rehab Potential Good    PT Frequency 2x / week    PT Duration 8 weeks    PT Treatment/Interventions Cryotherapy;Electrical Stimulation;Moist Heat;Therapeutic activities;Therapeutic exercise;Neuromuscular re-education;Manual techniques;Dry needling    PT Next Visit Plan Manual therapy and neurodynamics to improve L buttock and LE referred symptoms, isometrics and gentle mobility with with gradual progression to gluteal strengthening.    PT Home Exercise Plan Access Code [email or web address]    Consulted and Agree with Plan of Care Patient             Patient will benefit from skilled therapeutic intervention in order to improve the following deficits and impairments:  Abnormal gait, Decreased strength, Impaired sensation, Pain, Decreased range of motion, Impaired flexibility, Postural dysfunction  Visit Diagnosis: Pain in left hip  Left-sided low back pain with left-sided sciatica, unspecified chronicity  Difficulty in walking, not elsewhere classified  Muscle weakness (generalized)     Problem List Patient Active Problem List   Diagnosis Date Noted   Left lumbar radiculopathy 03/13/2021   Piriformis syndrome, left 02/12/2021   Aortic atherosclerosis ([place]) 08/01/2020   Disorder of bursae of shoulder region 02/02/2020   PMR (polymyalgia rheumatica) ([place]) 02/02/2020   Primary osteoarthritis of left hip 01/24/2020   Recurrent major depression in partial remission ([place]) 12/28/2019   Diffuse photodamage of skin 02/13/2016  Basal cell carcinoma 01/11/2016   Gastroesophageal reflux disease without esophagitis 07/03/2015   Type II diabetes mellitus  with complication ([place]) 05/06/5101   Acquired hypothyroidism 10/31/2014   Allergic rhinitis 10/31/2014   Benign hypertension 10/31/2014   CD (celiac disease) 10/31/2014   Dyslipidemia 10/31/2014   OP (osteoporosis) 10/31/2014    Pincus Badder, PT 04/11/2021, 11:53 AM Merdis Delay, PT, DPT Physical Therapist - [place]    [facility] [address] [place], [place], [address] Phone: [phone]   Fax:  [phone]  Name: Kelsa Jaworowski MRN: [id number] Date of Birth: 05-02-1935

## 2021-04-15 ENCOUNTER — Ambulatory Visit (INDEPENDENT_AMBULATORY_CARE_PROVIDER_SITE_OTHER): Payer: Medicare PPO

## 2021-04-15 ENCOUNTER — Other Ambulatory Visit: Payer: Self-pay

## 2021-04-15 VITALS — BP 132/78 | HR 73 | Temp 98.4°F | Resp 16 | Ht 65.0 in | Wt 176.0 lb

## 2021-04-15 DIAGNOSIS — Z0000 Encounter for general adult medical examination without abnormal findings: Secondary | ICD-10-CM

## 2021-04-15 NOTE — Progress Notes (Signed)
Subjective:   Jill Dunlap is a 86 y.o. female who presents for Medicare Annual (Subsequent) preventive examination.  Review of Systems     Cardiac Risk Factors include: advanced age (>47mn, >>34women);dyslipidemia;hypertension;diabetes mellitus     Objective:    Today's Vitals   04/15/21 1536 04/15/21 1537  BP: 132/78   Pulse: 73   Resp: 16   Temp: 98.4 F (36.9 C)   TempSrc: Oral   SpO2: 97%   Weight: 176 lb (79.8 kg)   Height: 5' 5"  (1.651 m)   PainSc:  4    Body mass index is 29.29 kg/m.  Advanced Directives 04/15/2021 03/21/2021 04/11/2020 01/26/2019 04/20/2018 11/02/2017 02/04/2017  Does Patient Have a Medical Advance Directive? Yes Yes Yes Yes Yes Yes Yes  Type of AParamedicof ADoverLiving will - HLake CityLiving will HKeeneLiving will HMidwayLiving will HRainierLiving will [place]allahasseeLiving will  Does patient want to make changes to medical advance directive? - No - Patient declined - - - Yes (MAU/Ambulatory/Procedural Areas - Information given) -  Copy of [place] Chart? No - copy requested - No - copy requested No - copy requested - No - copy requested -    Current Medications (verified) Outpatient Encounter Medications as of 04/15/2021  Medication Sig   azelastine (ASTELIN) 0.1 % nasal spray INSTILL 2 SPRAYS IN EACH NOSTRIL TWICE DAILY AS DIRECTED   Biotin 2500 MCG CAPS Take 1 capsule by mouth daily.   calcium-vitamin D (OSCAL WITH D) 500-200 MG-UNIT tablet Take 1 tablet by mouth 2 (two) times daily.   Cranberry 500 MG CAPS Take 1 capsule by mouth daily.    escitalopram (LEXAPRO) 10 MG tablet TAKE ONE (1) TABLET BY MOUTH ONCE DAILY   fexofenadine (ALLEGRA) 180 MG tablet Take 1 tablet by mouth daily.   fluticasone (FLONASE) 50 MCG/ACT nasal spray PLACE 2 SPRAYS INTO BOTH NOSTRILS DAILY.    Glucosamine-Chondroitin (MOVE FREE PO) Take 1 capsule by mouth in the morning and at bedtime.   levothyroxine (SYNTHROID) 50 MCG tablet TAKE (1) TABLET BY MOUTH DAILY BEFORE BREAKFAST.   lisinopril-hydrochlorothiazide (PRINZIDE,ZESTORETIC) 20-12.5 MG tablet Take 1 tablet by mouth daily.   Multiple Vitamins-Minerals (CENTRUM SILVER PO) Take 1 tablet by mouth daily.   omeprazole (PRILOSEC) 20 MG capsule TAKE ONE (1) CAPSULE EACH DAY.   predniSONE (DELTASONE) 5 MG tablet Take 1 tablet (5 mg total) by mouth daily with breakfast.   simvastatin (ZOCOR) 20 MG tablet TAKE ONE TABLET BY MOUTH DAILY AT 6PM.   TURMERIC PO Take 1 tablet by mouth daily.    [DISCONTINUED] gabapentin (NEURONTIN) 100 MG capsule One tab PO qHS for a week, then BID for a week, then TID.   [DISCONTINUED] Omeprazole (PRILOSEC PO) Take 1 tablet by mouth.   No facility-administered encounter medications on file as of 04/15/2021.    Allergies (verified) Ciprofloxacin hcl   History: Past Medical History:  Diagnosis Date   Acid reflux    Allergy    Cancer ([place]    skin ca on leg head and back   Celiac disease    Current moderate episode of major depressive disorder without prior episode (HCC)    Gastroesophageal reflux disease without esophagitis    Hyperlipidemia    Hypertension    Hypothyroidism    OP (osteoporosis)    Piriformis syndrome, left 02/12/2021   Pre-diabetes    Primary osteoarthritis  of left hip    Type II diabetes mellitus with complication (HCC)    Vertigo    no episodes in several years   Wears dentures    partial lower   Past Surgical History:  Procedure Laterality Date   ABDOMINAL HYSTERECTOMY  1984   for bleeding   Basil cell     BILATERAL SALPINGOOPHORECTOMY  1984   CATARACT EXTRACTION W/PHACO Right 11/24/2016   Procedure: CATARACT EXTRACTION PHACO AND INTRAOCULAR LENS PLACEMENT ([place]) Right;  Surgeon: Leandrew Koyanagi, MD;  Location: [facility];  Service: Ophthalmology;   Laterality: Right;   CATARACT EXTRACTION W/PHACO Left 02/04/2017   Procedure: CATARACT EXTRACTION PHACO AND INTRAOCULAR LENS PLACEMENT ([place])  Left;  Surgeon: Leandrew Koyanagi, MD;  Location: [facility];  Service: Ophthalmology;  Laterality: Left;   ORIF WRIST FRACTURE Left 2015   TYMPANOPLASTY  1992   Family History  Problem Relation Age of Onset   Cancer Mother        oral   Hypertension Father    Heart disease Father    Breast cancer Neg Hx    Social History   Socioeconomic History   Marital status: Widowed    Spouse name: Not on file   Number of children: 2   Years of education: 16   Highest education level: Bachelor's degree (e.g., BA, AB, BS)  Occupational History   Occupation: Retired  Tobacco Use   Smoking status: Never   Smokeless tobacco: Never  Vaping Use   Vaping Use: Never used  Substance and Sexual Activity   Alcohol use: Never   Drug use: Never   Sexual activity: Not Currently    Partners: Male  Other Topics Concern   Not on file  Social History Narrative   Pt lives alone   Social Determinants of Health   Financial Resource Strain: Low Risk    Difficulty of Paying Living Expenses: Not hard at all  Food Insecurity: No Food Insecurity   Worried About Charity fundraiser in the Last Year: Never true   [place] in the Last Year: Never true  Transportation Needs: No Transportation Needs   Lack of Transportation (Medical): No   Lack of Transportation (Non-Medical): No  Physical Activity: Insufficiently Active   Days of Exercise per Week: 2 days   Minutes of Exercise per Session: 50 min  Stress: No Stress Concern Present   Feeling of Stress : Not at all  Social Connections: Moderately Integrated   Frequency of Communication with Friends and Family: More than three times a week   Frequency of Social Gatherings with Friends and Family: Three times a week   Attends Religious Services: More than 4 times per year   Active Member of Clubs  or Organizations: No   Attends Archivist Meetings: Never   Marital Status: Married    Tobacco Counseling Counseling given: Not Answered   Clinical Intake:  Pre-visit preparation completed: Yes  Pain : 0-10 Pain Score: 4  Pain Type: Chronic pain Pain Location: Hip Pain Orientation: Left Pain Descriptors / Indicators: Aching, Throbbing Pain Onset: More than a month ago Pain Frequency: Intermittent     BMI - recorded: 29.29 Nutritional Status: BMI 25 -29 Overweight Nutritional Risks: None Diabetes: Yes CBG done?: No Did pt. bring in CBG monitor from home?: No  How often do you need to have someone help you when you read instructions, pamphlets, or other written materials from your doctor or pharmacy?: 1 - Never  Nutrition Risk Assessment:  Has the patient had any N/V/D within the last 2 months?  No  Does the patient have any non-healing wounds?  No  Has the patient had any unintentional weight loss or weight gain?  No   Diabetes:  Is the patient diabetic?  Yes  If diabetic, was a CBG obtained today?  No  Did the patient bring in their glucometer from home?  No  How often do you monitor your CBG's? Pt does not actively check blood sugar.   Financial Strains and Diabetes Management:  Are you having any financial strains with the device, your supplies or your medication? No .  Does the patient want to be seen by Chronic Care Management for management of their diabetes?  No  Would the patient like to be referred to a Nutritionist or for Diabetic Management?  No   Diabetic Exams:  Diabetic Eye Exam: Completed 01/18/21 negative retinopathy.   Diabetic Foot Exam: Completed 08/01/20.   Interpreter Needed?: No  Information entered by :: Clemetine Marker LPN   Activities of Daily Living In your present state of health, do you have any difficulty performing the following activities: 04/15/2021 03/27/2021  Hearing? Tempie Donning  Vision? Y Y  Difficulty concentrating or  making decisions? N N  Walking or climbing stairs? Y Y  Dressing or bathing? N N  Doing errands, shopping? N N  Preparing Food and eating ? N -  Using the Toilet? N -  In the past six months, have you accidently leaked urine? N -  Do you have problems with loss of bowel control? N -  Managing your Medications? N -  Managing your Finances? N -  Housekeeping or managing your Housekeeping? N -  Some recent data might be hidden    Patient Care Team: Glean Hess, MD as PCP - General (Internal Medicine) Lucilla Lame, MD as Consulting Physician (Gastroenterology) Corey Skains, MD as Consulting Physician (Cardiology) Leandrew Koyanagi, MD as Referring Physician (Ophthalmology)  Indicate any recent Medical Services you may have received from other than [facility] providers in the past year (date may be approximate).     Assessment:   This is a routine wellness examination for Nakari.  Hearing/Vision screen Hearing Screening - Comments:: Hearing aids maintained by [employer] in [place] - Comments:: Annual vision screenings done [facility] Dr. Wallace Going  Dietary issues and exercise activities discussed: Current Exercise Habits: Home exercise routine, Type of exercise: Other - see comments (physical therapy), Time (Minutes): 45, Frequency (Times/Week): 2, Weekly Exercise (Minutes/Week): 90, Intensity: Mild, Exercise limited by: orthopedic condition(s);neurologic condition(s)   Goals Addressed             This Visit's Progress    DIET - INCREASE WATER INTAKE   On track    Recommend to drink at least 6-8 8oz glasses of water per day.       Depression Screen PHQ 2/9 Scores 04/15/2021 03/27/2021 03/13/2021 02/12/2021 01/04/2021 11/22/2020 10/01/2020  PHQ - 2 Score 0 0 0 0 0 1 0  PHQ- 9 Score 0 0 0 0 0 2 0    Fall Risk Fall Risk  04/15/2021 03/27/2021 03/13/2021 02/12/2021 01/04/2021  Falls in the past year? 1 1 1 1 1   Number falls in past yr: 1 1 0 0 0  Injury  with Fall? 0 0 1 1 0  Risk for fall due to : History of fall(s);Impaired mobility;Impaired balance/gait History of fall(s) History of fall(s);Orthopedic patient  History of fall(s);Orthopedic patient -  Risk for fall due to: Comment - - - - -  Follow up Falls prevention discussed Falls evaluation completed Falls evaluation completed;Falls prevention discussed Falls evaluation completed Falls evaluation completed    FALL RISK PREVENTION PERTAINING TO THE HOME:  Any stairs in or around the home? Yes  If so, are there any without handrails? No  Home free of loose throw rugs in walkways, pet beds, electrical cords, etc? Yes  Adequate lighting in your home to reduce risk of falls? Yes   ASSISTIVE DEVICES UTILIZED TO PREVENT FALLS:  Life alert? No  Use of a cane, walker or w/c? Yes  Grab bars in the bathroom? Yes  Shower chair or bench in shower? Yes  Elevated toilet seat or a handicapped toilet? Yes   TIMED UP AND GO:  Was the test performed? Yes .  Length of time to ambulate 10 feet: 7 sec.   Gait slow and steady with assistive device  Cognitive Function: Normal cognitive status assessed by direct observation by this Nurse Health Advisor. No abnormalities found.       6CIT Screen 01/26/2019 11/02/2017 10/29/2016  What Year? 0 points 0 points 0 points  What month? 0 points 0 points 0 points  What time? 0 points 0 points 0 points  Count back from 20 0 points 0 points 0 points  Months in reverse 0 points 0 points 0 points  Repeat phrase 0 points 2 points 0 points  Total Score 0 2 0    Immunizations Immunization History  Administered Date(s) Administered   Fluad Quad(high Dose 65+) 04/25/2020, 03/27/2021   Influenza, High Dose Seasonal PF 04/27/2017, 04/28/2019   Influenza-Unspecified 04/27/2015, 04/30/2018   PFIZER Comirnaty(Gray Top)Covid-19 Tri-Sucrose Vaccine 12/04/2020   PFIZER(Purple Top)SARS-COV-2 Vaccination 07/27/2019, 08/17/2019, 03/30/2020   Pneumococcal Conjugate-13  06/29/2014   Pneumococcal Polysaccharide-23 10/30/2008   Td 10/30/2008   Zoster Recombinat (Shingrix) 04/07/2018, 06/09/2018   Zoster, Live 10/30/2008    TDAP status: Due, Education has been provided regarding the importance of this vaccine. Advised may receive this vaccine at local pharmacy or Health Dept. Aware to provide a copy of the vaccination record if obtained from local pharmacy or Health Dept. Verbalized acceptance and understanding.  Flu Vaccine status: Up to date  Pneumococcal vaccine status: Up to date  Covid-19 vaccine status: Completed vaccines  Qualifies for Shingles Vaccine? Yes   Zostavax completed Yes   Shingrix Completed?: Yes  Screening Tests Health Maintenance  Topic Date Due   COVID-19 Vaccine (5 - Booster for Pfizer series) 04/06/2021   TETANUS/TDAP  11/22/2021 (Originally 10/31/2018)   FOOT EXAM  08/01/2021   HEMOGLOBIN A1C  09/24/2021   OPHTHALMOLOGY EXAM  01/18/2022   MAMMOGRAM  01/22/2022   INFLUENZA VACCINE  Completed   DEXA SCAN  Completed   Zoster Vaccines- Shingrix  Completed   HPV VACCINES  Aged Out    Health Maintenance  Health Maintenance Due  Topic Date Due   COVID-19 Vaccine (5 - Booster for [place] series) 04/06/2021    Colorectal cancer screening: No longer required.   Mammogram status: Completed 01/22/21. Repeat every year  Bone Density status: Completed 01/22/21. Results reflect: Bone density results: OSTEOPENIA. Repeat every 2 years.  Lung Cancer Screening: (Low Dose CT Chest recommended if Age 84-80 years, 30 pack-year currently smoking OR have quit w/in 15years.) does not qualify.   Additional Screening:  Hepatitis C Screening: does not qualify  Vision Screening: Recommended annual ophthalmology exams for early detection  of glaucoma and other disorders of the eye. Is the patient up to date with their annual eye exam?  Yes  Who is the provider or what is the name of the office in which the patient attends annual eye exams?  [facility]   Dental Screening: Recommended annual dental exams for proper oral hygiene  Community Resource Referral / Chronic Care Management: CRR required this visit?  No   CCM required this visit?  No      Plan:     I have personally reviewed and noted the following in the patient's chart:   Medical and social history Use of alcohol, tobacco or illicit drugs  Current medications and supplements including opioid prescriptions.  Functional ability and status Nutritional status Physical activity Advanced directives List of other physicians Hospitalizations, surgeries, and ER visits in previous 12 months Vitals Screenings to include cognitive, depression, and falls Referrals and appointments  In addition, I have reviewed and discussed with patient certain preventive protocols, quality metrics, and best practice recommendations. A written personalized care plan for preventive services as well as general preventive health recommendations were provided to patient.     Clemetine Marker, LPN   04/06/9149   Nurse Notes: none

## 2021-04-15 NOTE — Patient Instructions (Signed)
Jill Dunlap , Thank you for taking time to come for your Medicare Wellness Visit. I appreciate your ongoing commitment to your health goals. Please review the following plan we discussed and let me know if I can assist you in the future.   Screening recommendations/referrals: Colonoscopy: no longer required Mammogram: done 01/22/21 Bone Density: done 01/22/21 Recommended yearly ophthalmology/optometry visit for glaucoma screening and checkup Recommended yearly dental visit for hygiene and checkup  Vaccinations: Influenza vaccine: done 03/27/21 Pneumococcal vaccine: done 06/29/14 Tdap vaccine: due Shingles vaccine: done 04/07/18 & 06/09/18   Covid-19:done 07/27/19, 08/17/19 & 03/30/20  Advanced directives: Please bring a copy of your health care power of attorney and living will to the office at your convenience.   Conditions/risks identified: Keep up the great work!  Next appointment: Follow up in one year for your annual wellness visit    Preventive Care 84 Years and Older, Female Preventive care refers to lifestyle choices and visits with your health care provider that can promote health and wellness. What does preventive care include? A yearly physical exam. This is also called an annual well check. Dental exams once or twice a year. Routine eye exams. Ask your health care provider how often you should have your eyes checked. Personal lifestyle choices, including: Daily care of your teeth and gums. Regular physical activity. Eating a healthy diet. Avoiding tobacco and drug use. Limiting alcohol use. Practicing safe sex. Taking low-dose aspirin every day. Taking vitamin and mineral supplements as recommended by your health care provider. What happens during an annual well check? The services and screenings done by your health care provider during your annual well check will depend on your age, overall health, lifestyle risk factors, and family history of disease. Counseling  Your  health care provider may ask you questions about your: Alcohol use. Tobacco use. Drug use. Emotional well-being. Home and relationship well-being. Sexual activity. Eating habits. History of falls. Memory and ability to understand (cognition). Work and work Statistician. Reproductive health. Screening  You may have the following tests or measurements: Height, weight, and BMI. Blood pressure. Lipid and cholesterol levels. These may be checked every 5 years, or more frequently if you are over 84 years old. Skin check. Lung cancer screening. You may have this screening every year starting at age 84 if you have a 30-pack-year history of smoking and currently smoke or have quit within the past 15 years. Fecal occult blood test (FOBT) of the stool. You may have this test every year starting at age 84. Flexible sigmoidoscopy or colonoscopy. You may have a sigmoidoscopy every 5 years or a colonoscopy every 10 years starting at age 84. Hepatitis C blood test. Hepatitis B blood test. Sexually transmitted disease (STD) testing. Diabetes screening. This is done by checking your blood sugar (glucose) after you have not eaten for a while (fasting). You may have this done every 1-3 years. Bone density scan. This is done to screen for osteoporosis. You may have this done starting at age 84. Mammogram. This may be done every 84-2 years. Talk to your health care provider about how often you should have regular mammograms. Talk with your health care provider about your test results, treatment options, and if necessary, the need for more tests. Vaccines  Your health care provider may recommend certain vaccines, such as: Influenza vaccine. This is recommended every year. Tetanus, diphtheria, and acellular pertussis (Tdap, Td) vaccine. You may need a Td booster every 10 years. Zoster vaccine. You may need this after  age 84. Pneumococcal 13-valent conjugate (PCV13) vaccine. One dose is recommended after age  84. Pneumococcal polysaccharide (PPSV23) vaccine. One dose is recommended after age 84. Talk to your health care provider about which screenings and vaccines you need and how often you need them. This information is not intended to replace advice given to you by your health care provider. Make sure you discuss any questions you have with your health care provider. Document Released: 08/03/2015 Document Revised: 03/26/2016 Document Reviewed: 05/08/2015 Elsevier Interactive Patient Education  2017 [place] Prevention in the Home Falls can cause injuries. They can happen to people of all ages. There are many things you can do to make your home safe and to help prevent falls. What can I do on the outside of my home? Regularly fix the edges of walkways and driveways and fix any cracks. Remove anything that might make you trip as you walk through a door, such as a raised step or threshold. Trim any bushes or trees on the path to your home. Use bright outdoor lighting. Clear any walking paths of anything that might make someone trip, such as rocks or tools. Regularly check to see if handrails are loose or broken. Make sure that both sides of any steps have handrails. Any raised decks and porches should have guardrails on the edges. Have any leaves, snow, or ice cleared regularly. Use sand or salt on walking paths during winter. Clean up any spills in your garage right away. This includes oil or grease spills. What can I do in the bathroom? Use night lights. Install grab bars by the toilet and in the tub and shower. Do not use towel bars as grab bars. Use non-skid mats or decals in the tub or shower. If you need to sit down in the shower, use a plastic, non-slip stool. Keep the floor dry. Clean up any water that spills on the floor as soon as it happens. Remove soap buildup in the tub or shower regularly. Attach bath mats securely with double-sided non-slip rug tape. Do not have throw  rugs and other things on the floor that can make you trip. What can I do in the bedroom? Use night lights. Make sure that you have a light by your bed that is easy to reach. Do not use any sheets or blankets that are too big for your bed. They should not hang down onto the floor. Have a firm chair that has side arms. You can use this for support while you get dressed. Do not have throw rugs and other things on the floor that can make you trip. What can I do in the kitchen? Clean up any spills right away. Avoid walking on wet floors. Keep items that you use a lot in easy-to-reach places. If you need to reach something above you, use a strong step stool that has a grab bar. Keep electrical cords out of the way. Do not use floor polish or wax that makes floors slippery. If you must use wax, use non-skid floor wax. Do not have throw rugs and other things on the floor that can make you trip. What can I do with my stairs? Do not leave any items on the stairs. Make sure that there are handrails on both sides of the stairs and use them. Fix handrails that are broken or loose. Make sure that handrails are as long as the stairways. Check any carpeting to make sure that it is firmly attached to the stairs.  Fix any carpet that is loose or worn. Avoid having throw rugs at the top or bottom of the stairs. If you do have throw rugs, attach them to the floor with carpet tape. Make sure that you have a light switch at the top of the stairs and the bottom of the stairs. If you do not have them, ask someone to add them for you. What else can I do to help prevent falls? Wear shoes that: Do not have high heels. Have rubber bottoms. Are comfortable and fit you well. Are closed at the toe. Do not wear sandals. If you use a stepladder: Make sure that it is fully opened. Do not climb a closed stepladder. Make sure that both sides of the stepladder are locked into place. Ask someone to hold it for you, if  possible. Clearly mark and make sure that you can see: Any grab bars or handrails. First and last steps. Where the edge of each step is. Use tools that help you move around (mobility aids) if they are needed. These include: Canes. Walkers. Scooters. Crutches. Turn on the lights when you go into a dark area. Replace any light bulbs as soon as they burn out. Set up your furniture so you have a clear path. Avoid moving your furniture around. If any of your floors are uneven, fix them. If there are any pets around you, be aware of where they are. Review your medicines with your doctor. Some medicines can make you feel dizzy. This can increase your chance of falling. Ask your doctor what other things that you can do to help prevent falls. This information is not intended to replace advice given to you by your health care provider. Make sure you discuss any questions you have with your health care provider. Document Released: 05/03/2009 Document Revised: 12/13/2015 Document Reviewed: 08/11/2014 Elsevier Interactive Patient Education  2017 [employer].

## 2021-04-16 ENCOUNTER — Ambulatory Visit: Payer: Medicare PPO | Admitting: Physical Therapy

## 2021-04-16 DIAGNOSIS — M5442 Lumbago with sciatica, left side: Secondary | ICD-10-CM

## 2021-04-16 DIAGNOSIS — M6281 Muscle weakness (generalized): Secondary | ICD-10-CM

## 2021-04-16 DIAGNOSIS — R262 Difficulty in walking, not elsewhere classified: Secondary | ICD-10-CM

## 2021-04-16 DIAGNOSIS — M25552 Pain in left hip: Secondary | ICD-10-CM

## 2021-04-16 NOTE — Therapy (Signed)
[facility] [address] [place], [place], [address] Phone: [phone]   Fax:  [phone]  Physical Therapy Treatment  Patient Details  Name: Jill Dunlap MRN: [id number] Date of Birth: 1935/01/15 Referring Provider (PT): Rosette Reveal, MD   Encounter Date: 04/16/2021   PT End of Session - 04/17/21 2056     Visit Number 8    Number of Visits 17    Date for PT Re-Evaluation 05/16/21    Authorization Type VL: based on auth.    Authorization Time Period Auth 9/1-10/27    Authorization - Visit Number 7    Authorization - Number of Visits 17    Progress Note Due on Visit 10    PT Start Time 0945    PT Stop Time 1030    PT Time Calculation (min) 45 min    Activity Tolerance Patient tolerated treatment well    Behavior During Therapy WFL for tasks assessed/performed             Past Medical History:  Diagnosis Date   Acid reflux    Allergy    Cancer ([place])    skin ca on leg head and back   Celiac disease    Current moderate episode of major depressive disorder without prior episode (HCC)    Gastroesophageal reflux disease without esophagitis    Hyperlipidemia    Hypertension    Hypothyroidism    OP (osteoporosis)    Piriformis syndrome, left 02/12/2021   Pre-diabetes    Primary osteoarthritis of left hip    Type II diabetes mellitus with complication (HCC)    Vertigo    no episodes in several years   Wears dentures    partial lower    Past Surgical History:  Procedure Laterality Date   ABDOMINAL HYSTERECTOMY  1984   for bleeding   Basil cell     BILATERAL SALPINGOOPHORECTOMY  1984   CATARACT EXTRACTION W/PHACO Right 11/24/2016   Procedure: CATARACT EXTRACTION PHACO AND INTRAOCULAR LENS PLACEMENT ([place]) Right;  Surgeon: Leandrew Koyanagi, MD;  Location: [facility];  Service: Ophthalmology;  Laterality: Right;   CATARACT EXTRACTION W/PHACO Left 02/04/2017   Procedure: CATARACT EXTRACTION PHACO  AND INTRAOCULAR LENS PLACEMENT ([place])  Left;  Surgeon: Leandrew Koyanagi, MD;  Location: [place];  Service: Ophthalmology;  Laterality: Left;   ORIF WRIST FRACTURE Left 2015   TYMPANOPLASTY  1992    There were no vitals filed for this visit.   Subjective Assessment - 04/17/21 2141     Subjective Pt reports less than 1/10 pain at arrival. She states her symptoms come and go. Patient reports doing well following last PT sesion. Patient reports doing well with her home exercises. Patient reports having good days in regard to her pain today and yesterday.    Pertinent History Pateint is an 86 year old female with primary complaint of left lower limb pain, left hip pain. Patient reports L lower limb pain down to her L foot with tingling down to her toes. She reports sometimes it feels like it starts in the hip and goes down - sometimes it seems to begin in her lower leg and moves up to hip. She reports one episode of numbness. Hx of Type II DM. Patient reports that she developed PMR in May-June 2021; she states that this is where her issues began. PMR improved with use of prednisone. She is still tapering from prednisone to date. She states that as PMR  has improved and then her L lower quarter pain started. She has children that live fairly close but are not readily available always. She states that her most recent condition began around beginning of August. She states that change in footwear didn't help. She denies night pain - sometimes she has difficulty getting to sleep initially. She reports pain with putting weight on LLE in AM. Hx of depression following her husband's death. No bowel/bladder changes. She states that sometimes she thinks bending may help her feel better.    Limitations Walking;Standing;Sitting;House hold activities    How long can you walk comfortably? Minimal distance when pain is increased    Diagnostic tests Radiograph, see below    Patient Stated Goals Goals: Able to  take care of herself without pain              TREATMENT   *next visit/not today* NuStep x 5 minutes, Level 1; seat 5, arms 8 - unbilled time       Manual Therapy - for symptom modulation, soft tissue sensitivity and mobility, joint mobility, ROM    Sidelying R: STM/DTM and IASTM with Hypervolt along L gluteus medius and piriformis/gemelli complex   TPR along L piriformis (reproduces referred pain down LLE)   LLE long-leg distraction in supine, gr II   *not today* STM L distal gastroc     Neuromuscular Re-education - for nervous system downregulation, gluteal musculature activation and exercises to promote LE kinetic chain stability   Sidelying Clamshell (lying on R); x20 Supine Pelvic tilt; 2x10 [heavy verbal cueing, tactile cueing, and demonstration for technique]  *next visit* Hip ABD and ADD isometrics; 2x10, 3 sec (with PT manual resistance in supine hooklying today)    *not today* Glute Bridge, supine; 2x5 (fleeting L gluteal pain)     Therapeutic Exercise - HEP establishment to improve carryover of in-clinic intervention, soft tissue mobility/extensibility, dural/neural mobility, repeated movement for symptom modulation/decreasing peripheral symptoms   Repeated lumbar flexion in sitting; x10     Lower trunk rotations,supine; x10 ea dir    Supine piriformis stretch; 2x30sec    Walking trial in clinic; ambulate 2 laps in gym [no reproduction of symptoms]    *not today* Seated sciatic glider; 2x10       Cold pack (unbilled) utilized at end of sesion for analgesic effect and anti-inflammatory effect along L gluteal region, x5 minutes in R sidelying       ASSESSMENT Patient has experienced relatively mild symptoms over the previous few days and has lessening complaints of symptoms. She does have frequent radiating pain down to L lateral leg with attempted gluteal loading - this is alleviated with use of LLE distraction. She presents with  significantly improved gait pattern and has no pain with gait trials following repeated lumbar flexion in sittiing today. Overall, she tolerated session well and should continue to benefit from current PT POC.     PT Short Term Goals - 03/21/21 1219       PT SHORT TERM GOAL #1   Title Pt will be independent and 100% compliant with established HEP and activity modification as needed to augment PT intervention and prevent flare-up of back pain as needed for best return to prior level of function.    Baseline 03/21/21: Baseline HEP initiated    Time 3    Period Weeks    Status New    Target Date 04/11/21               PT  Long Term Goals - 03/21/21 1218       PT LONG TERM GOAL #1   Title Patient will demonstrate improved function as evidenced by a score of 68 on FOTO measure for full participation in activities at home and in the community.    Baseline 03/21/21: 55    Time 8    Period Weeks    Status New    Target Date 05/16/21      PT LONG TERM GOAL #2   Title Patient will have full thoracolumbar AROM without reproduction of pain as needed for functional reaching, self-care ADLs, bed mobility, household chores.    Baseline 03/21/21: Pain and motion loss with extension and R rotation, pain with L rotation.    Time 8    Period Weeks    Status New    Target Date 05/16/21      PT LONG TERM GOAL #3   Title Patient will have MMT for all tested musculature 4+/5 or greater without reproduction of pain indicative of improved capacity for loading paraspinal/pelvic and gluteal mm as needed for performance of transferring, household chores, prolonged weightbearing tasks    Baseline 03/21/21: Hip flexion 3+, Hip ABD 4, Quad 4-, Hamtrings 3+    Time 8    Period Weeks    Status New    Target Date 05/16/21      PT LONG TERM GOAL #4   Title Patient will be able to ambulate for home-mobility distance (150 ft or greater) with no increase in pain > 1-2/10 as needed for accessing her home     Baseline 03/21/21: Difficulty with walking home-mobility distance due to L lower limb pain    Time 8    Period Weeks    Status New    Target Date 05/16/21                   Plan - 04/17/21 2145     Clinical Impression Statement Patient has experienced relatively mild symptoms over the previous few days and has lessening complaints of symptoms. She does have frequent radiating pain down to L lateral leg with attempted gluteal loading - this is alleviated with use of LLE distraction. She presents with significantly improved gait pattern and has no pain with gait trials following repeated lumbar flexion in sittiing today. Overall, she tolerated session well and should continue to benefit from current PT POC.    Personal Factors and Comorbidities Age;Comorbidity 3+    Comorbidities Osteoporosis, Type II DM, depression, celiac disease, GERD, Hx of skin cancer    Examination-Activity Limitations Locomotion Level;Sit;Stand;Stairs;Transfers    Examination-Participation Restrictions Church;Community Activity    Stability/Clinical Decision Making Evolving/Moderate complexity    Rehab Potential Good    PT Frequency 2x / week    PT Duration 8 weeks    PT Treatment/Interventions Cryotherapy;Electrical Stimulation;Moist Heat;Therapeutic activities;Therapeutic exercise;Neuromuscular re-education;Manual techniques;Dry needling    PT Next Visit Plan Manual therapy and neurodynamics to improve L buttock and LE referred symptoms, isometrics and gentle mobility with gradual progression to gluteal strengthening.    PT Home Exercise Plan Access Code [email or web address]    Consulted and Agree with Plan of Care Patient             Patient will benefit from skilled therapeutic intervention in order to improve the following deficits and impairments:  Abnormal gait, Decreased strength, Impaired sensation, Pain, Decreased range of motion, Impaired flexibility, Postural dysfunction  Visit Diagnosis: Pain in left  hip  Left-sided low back  pain with left-sided sciatica, unspecified chronicity  Difficulty in walking, not elsewhere classified  Muscle weakness (generalized)     Problem List Patient Active Problem List   Diagnosis Date Noted   Left lumbar radiculopathy 03/13/2021   Piriformis syndrome, left 02/12/2021   Aortic atherosclerosis ([place]) 08/01/2020   Disorder of bursae of shoulder region 02/02/2020   PMR (polymyalgia rheumatica) ([place]) 02/02/2020   Primary osteoarthritis of left hip 01/24/2020   Recurrent major depression in partial remission ([place]) 12/28/2019   Diffuse photodamage of skin 02/13/2016   Basal cell carcinoma 01/11/2016   Gastroesophageal reflux disease without esophagitis 07/03/2015   Type II diabetes mellitus with complication ([place]) 68/06/7516   Acquired hypothyroidism 10/31/2014   Allergic rhinitis 10/31/2014   Benign hypertension 10/31/2014   CD (celiac disease) 10/31/2014   Dyslipidemia 10/31/2014   OP (osteoporosis) 10/31/2014   Valentina Gu, PT, DPT #[id number]  Eilleen Kempf, PT 04/17/2021, 9:49 PM  [facility] [address] [place], [place], [address] Phone: [phone]   Fax:  [phone]  Name: Jaeleen Inzunza MRN: [id number] Date of Birth: 12/10/1934

## 2021-04-17 ENCOUNTER — Other Ambulatory Visit: Payer: Self-pay | Admitting: Internal Medicine

## 2021-04-17 ENCOUNTER — Encounter: Payer: Self-pay | Admitting: Physical Therapy

## 2021-04-17 DIAGNOSIS — F321 Major depressive disorder, single episode, moderate: Secondary | ICD-10-CM

## 2021-04-18 ENCOUNTER — Ambulatory Visit: Payer: Medicare PPO | Admitting: Physical Therapy

## 2021-04-18 ENCOUNTER — Other Ambulatory Visit: Payer: Self-pay

## 2021-04-18 DIAGNOSIS — M6281 Muscle weakness (generalized): Secondary | ICD-10-CM | POA: Diagnosis not present

## 2021-04-18 DIAGNOSIS — R262 Difficulty in walking, not elsewhere classified: Secondary | ICD-10-CM

## 2021-04-18 DIAGNOSIS — M25552 Pain in left hip: Secondary | ICD-10-CM | POA: Diagnosis not present

## 2021-04-18 DIAGNOSIS — M5442 Lumbago with sciatica, left side: Secondary | ICD-10-CM

## 2021-04-18 NOTE — Therapy (Signed)
[facility] [address] [place], [place], [address] Phone: [phone]   Fax:  [phone]  Physical Therapy Treatment  Patient Details  Name: Jill Dunlap MRN: [id number] Date of Birth: 25-Nov-1934 Referring Provider (PT): Rosette Reveal, MD   Encounter Date: 04/18/2021   PT End of Session - 04/18/21 1041     Visit Number 9    Number of Visits 17    Date for PT Re-Evaluation 05/16/21    Authorization Type VL: based on auth.    Authorization Time Period Auth 9/1-10/27    Authorization - Visit Number 9   corrected error in authorization visit count; initial evaluation was not included in initial authorization visit count   Authorization - Number of Visits 17    Progress Note Due on Visit 10    PT Start Time 1035    PT Stop Time 1115    PT Time Calculation (min) 40 min    Activity Tolerance Patient tolerated treatment well    Behavior During Therapy WFL for tasks assessed/performed             Past Medical History:  Diagnosis Date   Acid reflux    Allergy    Cancer ([place])    skin ca on leg head and back   Celiac disease    Current moderate episode of major depressive disorder without prior episode (HCC)    Gastroesophageal reflux disease without esophagitis    Hyperlipidemia    Hypertension    Hypothyroidism    OP (osteoporosis)    Piriformis syndrome, left 02/12/2021   Pre-diabetes    Primary osteoarthritis of left hip    Type II diabetes mellitus with complication (HCC)    Vertigo    no episodes in several years   Wears dentures    partial lower    Past Surgical History:  Procedure Laterality Date   ABDOMINAL HYSTERECTOMY  1984   for bleeding   Basil cell     BILATERAL SALPINGOOPHORECTOMY  1984   CATARACT EXTRACTION W/PHACO Right 11/24/2016   Procedure: CATARACT EXTRACTION PHACO AND INTRAOCULAR LENS PLACEMENT ([place]) Right;  Surgeon: Leandrew Koyanagi, MD;  Location: [facility];  Service:  Ophthalmology;  Laterality: Right;   CATARACT EXTRACTION W/PHACO Left 02/04/2017   Procedure: CATARACT EXTRACTION PHACO AND INTRAOCULAR LENS PLACEMENT ([place])  Left;  Surgeon: Leandrew Koyanagi, MD;  Location: [place];  Service: Ophthalmology;  Laterality: Left;   ORIF WRIST FRACTURE Left 2015   TYMPANOPLASTY  1992    There were no vitals filed for this visit.   Subjective Assessment - 04/18/21 1037     Subjective Patient reports pain is more in her knee presently. She reports nothing in her lower limb this AM. She reports quality of pain is different now - cramping versus throbbing previously. Patient reports compliance with her HEP. Patient reports not using her cane yesterday or today. She feels that mornings are "a little bit better than they were." She states she is getting better overall.    Pertinent History Pateint is an 86 year old female with primary complaint of left lower limb pain, left hip pain. Patient reports L lower limb pain down to her L foot with tingling down to her toes. She reports sometimes it feels like it starts in the hip and goes down - sometimes it seems to begin in her lower leg and moves up to hip. She reports one episode of numbness. Hx of Type II  DM. Patient reports that she developed PMR in May-June 2021; she states that this is where her issues began. PMR improved with use of prednisone. She is still tapering from prednisone to date. She states that as PMR has improved and then her L lower quarter pain started. She has children that live fairly close but are not readily available always. She states that her most recent condition began around beginning of August. She states that change in footwear didn't help. She denies night pain - sometimes she has difficulty getting to sleep initially. She reports pain with putting weight on LLE in AM. Hx of depression following her husband's death. No bowel/bladder changes. She states that sometimes she thinks bending  may help her feel better.    Limitations Walking;Standing;Sitting;House hold activities    How long can you walk comfortably? Minimal distance when pain is increased    Diagnostic tests Radiograph, see below    Patient Stated Goals Goals: Able to take care of herself without pain               TREATMENT      Manual Therapy - for symptom modulation, soft tissue sensitivity and mobility as needed for hip complex ROM    Sidelying R: STM/DTM and IASTM with Hypervolt along L gluteus medius and piriformis/gemelli complex   TPR along L piriformis (reproduces referred pain down LLE)   LLE long-leg distraction in supine, gr II   *not today* STM L distal gastroc     Neuromuscular Re-education - for nervous system downregulation, gluteal musculature activation and exercises to promote LE kinetic chain stability   Sidelying Clamshell (lying on R); x20, with yellow resistance band Supine Pelvic tilt; 2x10 [emphasis on posterior tilt, moderate tactile and verbal cueing + demonstration]    *next visit* Hip ABD and ADD isometrics; 2x10, 3 sec (with PT manual resistance in supine hooklying today)      *not today* Glute Bridge, supine; 2x5 (fleeting L gluteal pain)     Therapeutic Exercise - HEP establishment to improve carryover of in-clinic intervention, soft tissue mobility/extensibility, dural/neural mobility, repeated movement for symptom modulation/decreasing peripheral symptoms   NuStep x 5 minutes, Level 1; seat 5, arms 8 - unbilled time  Repeated lumbar flexion in sitting; x10    Repeated flexion in lying (double knee to chest with towel); 2x10  Repeated flexion in lying, with clinician overpressure (double knee to chest with towel); 1x10, 1 sec hold at end-range   Lower trunk rotations,supine; x10 ea dir    Supine piriformis stretch; x 1 min   Walking trial in clinic; ambulate 1 lap in gym [no reproduction of symptoms]     *not today* Seated sciatic glider;  2x10        *not today* Cold pack (unbilled) utilized at end of sesion for analgesic effect and anti-inflammatory effect along L gluteal region, x5 minutes in R sidelying         ASSESSMENT Patient feels her condition overall has improved, but she does still have posterior gluteal pain and intermittent referred pain along L lateral leg that pt describes as "cramping." Symptoms are reproduced intermittently with deep gluteal palpation and TPR. Pt has responded well with repeated lumbar flexion - lumbar spine derangement contributing largely to LE referred symptoms is likely given her treatment and repeated movement response. Utilized force progression for repeated flexion in lying with pt reporting diminishing peripheral symptoms, though these did re-occur after rest period. Updated HEP today for repeated flexion in lying (  double knee to chest with towel/blanket). Patient has made notable progress in intensity of symptoms, tolerance of lumbopelvic mobility and ability to load hip complex, and gait quality; she should continue to benefit from current PT POC.          PT Short Term Goals - 03/21/21 1219       PT SHORT TERM GOAL #1   Title Pt will be independent and 100% compliant with established HEP and activity modification as needed to augment PT intervention and prevent flare-up of back pain as needed for best return to prior level of function.    Baseline 03/21/21: Baseline HEP initiated    Time 3    Period Weeks    Status New    Target Date 04/11/21               PT Long Term Goals - 03/21/21 1218       PT LONG TERM GOAL #1   Title Patient will demonstrate improved function as evidenced by a score of 68 on FOTO measure for full participation in activities at home and in the community.    Baseline 03/21/21: 55    Time 8    Period Weeks    Status New    Target Date 05/16/21      PT LONG TERM GOAL #2   Title Patient will have full thoracolumbar AROM without reproduction of  pain as needed for functional reaching, self-care ADLs, bed mobility, household chores.    Baseline 03/21/21: Pain and motion loss with extension and R rotation, pain with L rotation.    Time 8    Period Weeks    Status New    Target Date 05/16/21      PT LONG TERM GOAL #3   Title Patient will have MMT for all tested musculature 4+/5 or greater without reproduction of pain indicative of improved capacity for loading paraspinal/pelvic and gluteal mm as needed for performance of transferring, household chores, prolonged weightbearing tasks    Baseline 03/21/21: Hip flexion 3+, Hip ABD 4, Quad 4-, Hamtrings 3+    Time 8    Period Weeks    Status New    Target Date 05/16/21      PT LONG TERM GOAL #4   Title Patient will be able to ambulate for home-mobility distance (150 ft or greater) with no increase in pain > 1-2/10 as needed for accessing her home    Baseline 03/21/21: Difficulty with walking home-mobility distance due to L lower limb pain    Time 8    Period Weeks    Status New    Target Date 05/16/21                   Plan - 04/19/21 0732     Clinical Impression Statement Patient feels her condition overall has improved, but she does still have posterior gluteal pain and intermittent referred pain along L lateral leg that pt describes as "cramping." Symptoms are reproduced intermittently with deep gluteal palpation and TPR. Pt has responded well with repeated lumbar flexion - lumbar spine derangement contributing largely to LE referred symptoms is likely given her treatment and repeated movement response. Utilized force progression for repeated flexion in lying with pt reporting diminishing peripheral symptoms, though these did re-occur after rest period. Updated HEP today for repeated flexion in lying (double knee to chest with towel/blanket). Patient has made notable progress in intensity of symptoms, tolerance of lumbopelvic mobility and ability to load  hip complex, and gait  quality; she should continue to benefit from current PT POC.    Personal Factors and Comorbidities Age;Comorbidity 3+    Comorbidities Osteoporosis, Type II DM, depression, celiac disease, GERD, Hx of skin cancer    Examination-Activity Limitations Locomotion Level;Sit;Stand;Stairs;Transfers    Examination-Participation Restrictions Church;Community Activity    Stability/Clinical Decision Making Evolving/Moderate complexity    Rehab Potential Good    PT Frequency 2x / week    PT Duration 8 weeks    PT Treatment/Interventions Cryotherapy;Electrical Stimulation;Moist Heat;Therapeutic activities;Therapeutic exercise;Neuromuscular re-education;Manual techniques;Dry needling    PT Next Visit Plan Manual therapy and neurodynamics to improve L buttock and LE referred symptoms, isometrics and gentle mobility with gradual progression to gluteal strengthening.    PT Home Exercise Plan Access Code [email or web address]    Consulted and Agree with Plan of Care Patient             Patient will benefit from skilled therapeutic intervention in order to improve the following deficits and impairments:  Abnormal gait, Decreased strength, Impaired sensation, Pain, Decreased range of motion, Impaired flexibility, Postural dysfunction  Visit Diagnosis: Pain in left hip  Left-sided low back pain with left-sided sciatica, unspecified chronicity  Difficulty in walking, not elsewhere classified  Muscle weakness (generalized)     Problem List Patient Active Problem List   Diagnosis Date Noted   Left lumbar radiculopathy 03/13/2021   Piriformis syndrome, left 02/12/2021   Aortic atherosclerosis ([place]) 08/01/2020   Disorder of bursae of shoulder region 02/02/2020   PMR (polymyalgia rheumatica) ([place]) 02/02/2020   Primary osteoarthritis of left hip 01/24/2020   Recurrent major depression in partial remission ([place]) 12/28/2019   Diffuse photodamage of skin 02/13/2016   Basal cell carcinoma 01/11/2016    Gastroesophageal reflux disease without esophagitis 07/03/2015   Type II diabetes mellitus with complication ([place]) 98/33/8250   Acquired hypothyroidism 10/31/2014   Allergic rhinitis 10/31/2014   Benign hypertension 10/31/2014   CD (celiac disease) 10/31/2014   Dyslipidemia 10/31/2014   OP (osteoporosis) 10/31/2014   Valentina Gu, PT, DPT #[id number]  Eilleen Kempf, PT 04/19/2021, 7:33 AM  [facility] [address] [place], [place], [address] Phone: [phone]   Fax:  [phone]  Name: Jill Dunlap MRN: [id number] Date of Birth: 09-24-1934

## 2021-04-19 ENCOUNTER — Encounter: Payer: Self-pay | Admitting: Physical Therapy

## 2021-04-23 ENCOUNTER — Ambulatory Visit: Payer: Medicare PPO | Attending: Family Medicine | Admitting: Physical Therapy

## 2021-04-23 ENCOUNTER — Other Ambulatory Visit: Payer: Self-pay

## 2021-04-23 DIAGNOSIS — M5442 Lumbago with sciatica, left side: Secondary | ICD-10-CM | POA: Diagnosis not present

## 2021-04-23 DIAGNOSIS — M25552 Pain in left hip: Secondary | ICD-10-CM

## 2021-04-23 DIAGNOSIS — M6281 Muscle weakness (generalized): Secondary | ICD-10-CM | POA: Diagnosis not present

## 2021-04-23 DIAGNOSIS — R262 Difficulty in walking, not elsewhere classified: Secondary | ICD-10-CM

## 2021-04-23 NOTE — Therapy (Signed)
[facility] [address] [place], [place], [address] Phone: [phone]   Fax:  [phone]  Physical Therapy Treatment/ Physical Therapy Progress Note   Dates of reporting period  03/21/21   to   04/23/21   Patient Details  Name: Jill Dunlap MRN: [id number] Date of Birth: 08-23-1934 Referring Provider (PT): Rosette Reveal, MD   Encounter Date: 04/23/2021   PT End of Session - 04/24/21 0826     Visit Number 10    Number of Visits 17    Date for PT Re-Evaluation 05/16/21    Authorization Type VL: based on auth.    Authorization Time Period Auth 9/1-10/27; Cert 3/79-02/40    Authorization - Visit Number 10   corrected error in authorization visit count; initial evaluation was not included in initial authorization visit count   Authorization - Number of Visits 17    Progress Note Due on Visit 64    PT Start Time 1033    PT Stop Time 1125    PT Time Calculation (min) 52 min    Activity Tolerance Patient tolerated treatment well    Behavior During Therapy WFL for tasks assessed/performed             Past Medical History:  Diagnosis Date   Acid reflux    Allergy    Cancer ([place])    skin ca on leg head and back   Celiac disease    Current moderate episode of major depressive disorder without prior episode ([place])    Gastroesophageal reflux disease without esophagitis    Hyperlipidemia    Hypertension    Hypothyroidism    OP (osteoporosis)    Piriformis syndrome, left 02/12/2021   Pre-diabetes    Primary osteoarthritis of left hip    Type II diabetes mellitus with complication ([place])    Vertigo    no episodes in several years   Wears dentures    partial lower    Past Surgical History:  Procedure Laterality Date   ABDOMINAL HYSTERECTOMY  1984   for bleeding   Basil cell     BILATERAL SALPINGOOPHORECTOMY  1984   CATARACT EXTRACTION W/PHACO Right 11/24/2016   Procedure: CATARACT EXTRACTION PHACO AND  INTRAOCULAR LENS PLACEMENT ([employer]) Right;  Surgeon: Leandrew Koyanagi, MD;  Location: [place];  Service: Ophthalmology;  Laterality: Right;   CATARACT EXTRACTION W/PHACO Left 02/04/2017   Procedure: CATARACT EXTRACTION PHACO AND INTRAOCULAR LENS PLACEMENT ([place])  Left;  Surgeon: Leandrew Koyanagi, MD;  Location: [place];  Service: Ophthalmology;  Laterality: Left;   ORIF WRIST FRACTURE Left 2015   TYMPANOPLASTY  1992    There were no vitals filed for this visit.   Subjective Assessment - 04/23/21 1052     Subjective Patient feels that pain along L hip region has impoved significantly. Patient reports 3/10 pain at arrival to PT. Patient reports fleeting excruciating pain when walking from parking lot to lobby today. She states she can get up in the morning with notably less pain. Patient reports compliance with her HEP. Patient reports she walks slowly at this time and notes that she may have more pain with increased distance. 98% SANE score at this time. Patient reports getting good sleep usually. She reports one episode of having dificulty getting comfortable Saturday night, but she states that otherwise she has been able to get to sleep. Pt reports doing okay with sitting presently. Pt has some lack of confidence and concern with walking  for long period.    Pertinent History Pateint is an 86 year old female with primary complaint of left lower limb pain, left hip pain. Patient reports L lower limb pain down to her L foot with tingling down to her toes. She reports sometimes it feels like it starts in the hip and goes down - sometimes it seems to begin in her lower leg and moves up to hip. She reports one episode of numbness. Hx of Type II DM. Patient reports that she developed PMR in May-June 2021; she states that this is where her issues began. PMR improved with use of prednisone. She is still tapering from prednisone to date. She states that as PMR has improved and then her  L lower quarter pain started. She has children that live fairly close but are not readily available always. She states that her most recent condition began around beginning of August. She states that change in footwear didn't help. She denies night pain - sometimes she has difficulty getting to sleep initially. She reports pain with putting weight on LLE in AM. Hx of depression following her husband's death. No bowel/bladder changes. She states that sometimes she thinks bending may help her feel better.    Limitations Walking;Standing;Sitting;House hold activities    How long can you walk comfortably? Minimal distance when pain is increased    Diagnostic tests Radiograph, see below    Patient Stated Goals Goals: Able to take care of herself without pain    Pain Onset More than a month ago              OBJECTIVE FINDINGS    Posture Lumbar lordosis: WNL Increased thoracic kyphosis, rounded shoulders, mild lateral shift R in static standing   Gait Mild decreased weight shift to L during L stance phase   Palpation Tenderness to palpation along L piriformis/gemelli complex, L gluteus medius, L PSIS, L L4-S1 spinous processes     Strength (out of 5) R/L 4/4- Hip flexion 5/4+ Hip abduction 5/5 Hip adduction 4+/4+ Knee extension 5/4- Knee flexion 5/5 Ankle dorsiflexion 4+/4+ Great toe extension  *Indicates pain     AROM (degrees) R/L (all movements include overpressure unless otherwise stated) Lumbar forward flexion (65): 100% Lumbar extension (30): 75% (central L-spine pressure)  Lumbar lateral flexion (25): R: 100% L: 100% Thoracic and Lumbar rotation (30 degrees):  R: 75% L: 75* (p! Gluteal) *Indicates pain     Repeated Movements Repeated flexion in lying: no effect, centralized (to gluteal/iliolumbar region versus lateral leg inferior to knee)            TREATMENT    Therapeutic Activities  Re-assessment performed (see above)     Manual Therapy - for  symptom modulation, soft tissue sensitivity and mobility as needed for hip complex ROM    Sidelying R: STM/DTM and IASTM with [employer] along L gluteus medius and piriformis/gemelli complex   TPR along L piriformis (reproduces referred pain down LLE)   LLE long-leg distraction in supine, gr II   *not today* STM L distal gastroc     Neuromuscular Re-education - for nervous system downregulation, gluteal musculature activation and exercises to promote LE kinetic chain stability   *next visit* Hip ABD and ADD isometrics; 2x10, 3 sec (with PT manual resistance in supine hooklying today)  Sidelying Clamshell (lying on R); x20, with yellow resistance band Supine Pelvic tilt; 2x10 [emphasis on posterior tilt, moderate tactile and verbal cueing + demonstration]     *not today* Glute  Bridge, supine; 2x5 (fleeting L gluteal pain)     Therapeutic Exercise - HEP establishment to improve carryover of in-clinic intervention, soft tissue mobility/extensibility, dural/neural mobility, repeated movement for symptom modulation/decreasing peripheral symptoms   NuStep x 5 minutes, Level 1; seat 5, arms 8 - unbilled time   Repeated lumbar flexion in sitting; x10     Repeated flexion in lying (double knee to chest with towel); 1x10   Walking trial in clinic; ambulate 2.5 laps in 70-ft hallway in gym [reproduction of L gluteal pain]       *not today* Repeated flexion in lying, with clinician overpressure (double knee to chest with towel); 1x10, 1 sec hold at end-range Lower trunk rotations,supine; x10 ea dir  Supine piriformis stretch; x 1 min Seated sciatic glider; 2x10        *not today* Cold pack (unbilled) utilized at end of sesion for analgesic effect and anti-inflammatory effect along L gluteal region, x5 minutes in R sidelying         ASSESSMENT Patient has not been able to progress with FOTO score and has not been able to meet pain-free home-distance ambulation goal. She does  have clinically significant improvement in numeric pain rating scale and reports significant subjective improvement in pain experience. She has improved L lower limb strength as demonstrated by manual strength testing. She exhibits improved quality of gait relative to her IE with mild dec weight shift to affected LE remaining. She has responded well with lumbar flexion, though she does have intermittent peripheral symptoms at this time. She has done well with maintaining her home program and has at least partially met 50% of long-term goals Patient has made notable progress in intensity of symptoms, tolerance of lumbopelvic mobility and ability to load hip complex, and gait quality; she should continue to benefit from current PT POC.       PT Short Term Goals - 04/24/21 4270       PT SHORT TERM GOAL #1   Title Pt will be independent and 100% compliant with established HEP and activity modification as needed to augment PT intervention and prevent flare-up of back pain as needed for best return to prior level of function.    Baseline 03/21/21: Baseline HEP initiated.    04/23/21: Compliant and independent with HEP, pt modifying volume of walking and aggravating positions/postures    Time 3    Period Weeks    Status Achieved    Target Date 04/11/21               PT Long Term Goals - 04/24/21 0829       PT LONG TERM GOAL #1   Title Patient will demonstrate improved function as evidenced by a score of 68 on FOTO measure for full participation in activities at home and in the community.    Baseline 03/21/21: 55.   04/23/21: 55    Time 8    Period Weeks    Status Not Met    Target Date 05/16/21      PT LONG TERM GOAL #2   Title Patient will have full thoracolumbar AROM without reproduction of pain as needed for functional reaching, self-care ADLs, bed mobility, household chores.    Baseline 03/21/21: Pain and motion loss with extension and R rotation, pain with L rotation.  04/23/21: Mild motion  loss with extension and bilateral rotation, L gluteal pain with L rotation    Time 8    Period Weeks    Status  Partially Met    Target Date 05/16/21      PT LONG TERM GOAL #3   Title Patient will have MMT for all tested musculature 4+/5 or greater without reproduction of pain indicative of improved capacity for loading paraspinal/pelvic and gluteal mm as needed for performance of transferring, household chores, prolonged weightbearing tasks    Baseline 03/21/21: Hip flexion 3+, Hip ABD 4, Quad 4-, Hamtrings 3+.   04/23/21: L hip flexion 4-, hip ABD 4+, Quad 4+, [place] 4-    Time 8    Period Weeks    Status Partially Met    Target Date 05/16/21      PT LONG TERM GOAL #4   Title Patient will be able to ambulate for home-mobility distance (150 ft or greater) with no increase in pain > 1-2/10 as needed for accessing her home    Baseline 03/21/21: Difficulty with walking home-mobility distance due to L lower limb pain.   04/23/21: Improved gait pattern, but reproduction of gluteal pain with walking 150 ft in clinic at 3/10 intensity    Time 8    Period Weeks    Status Not Met    Target Date 05/16/21                   Plan - 04/24/21 1321     Clinical Impression Statement Patient has not been able to progress with FOTO score and has not been able to meet pain-free home-distance ambulation goal. She does have clinically significant improvement in numeric pain rating scale and reports significant subjective improvement in pain experience. She has improved L lower limb strength as demonstrated by manual strength testing. She exhibits improved quality of gait relative to her IE with mild dec weight shift to affected LE remaining. She has responded well with lumbar flexion, though she does have intermittent peripheral symptoms at this time. She has done well with maintaining her home program and has at least partially met 50% of long-term goals Patient has made notable progress in intensity of  symptoms, tolerance of lumbopelvic mobility and ability to load hip complex, and gait quality; she should continue to benefit from current PT POC.    Personal Factors and Comorbidities Age;Comorbidity 3+    Comorbidities Osteoporosis, Type II DM, depression, celiac disease, GERD, Hx of skin cancer    Examination-Activity Limitations Locomotion Level;Sit;Stand;Stairs;Transfers    Examination-Participation Restrictions Church;Community Activity    Stability/Clinical Decision Making Evolving/Moderate complexity    Rehab Potential Good    PT Frequency 2x / week    PT Duration 8 weeks    PT Treatment/Interventions Cryotherapy;Electrical Stimulation;Moist Heat;Therapeutic activities;Therapeutic exercise;Neuromuscular re-education;Manual techniques;Dry needling    PT Next Visit Plan Manual therapy and neurodynamics to improve L buttock and LE referred symptoms, repeated movement prn, and continued progression of gluteal strengthening. Recommend continued PT 2x/week for 4 weeks.    PT Home Exercise Plan Access Code [email or web address]    Consulted and Agree with Plan of Care Patient             Patient will benefit from skilled therapeutic intervention in order to improve the following deficits and impairments:  Abnormal gait, Decreased strength, Impaired sensation, Pain, Decreased range of motion, Impaired flexibility, Postural dysfunction  Visit Diagnosis: Pain in left hip  Left-sided low back pain with left-sided sciatica, unspecified chronicity  Difficulty in walking, not elsewhere classified  Muscle weakness (generalized)     Problem List Patient Active Problem List   Diagnosis Date Noted  Left lumbar radiculopathy 03/13/2021   Piriformis syndrome, left 02/12/2021   Aortic atherosclerosis ([place]) 08/01/2020   Disorder of bursae of shoulder region 02/02/2020   PMR (polymyalgia rheumatica) ([place]) 02/02/2020   Primary osteoarthritis of left hip 01/24/2020   Recurrent major depression in  partial remission ([place]) 12/28/2019   Diffuse photodamage of skin 02/13/2016   Basal cell carcinoma 01/11/2016   Gastroesophageal reflux disease without esophagitis 07/03/2015   Type II diabetes mellitus with complication ([place]) 79/90/9400   Acquired hypothyroidism 10/31/2014   Allergic rhinitis 10/31/2014   Benign hypertension 10/31/2014   CD (celiac disease) 10/31/2014   Dyslipidemia 10/31/2014   OP (osteoporosis) 10/31/2014   Valentina Gu, PT, DPT #[id number]  Eilleen Kempf, PT 04/24/2021, 1:24 PM  [facility] [address] [place], [place], [address] Phone: [phone]   Fax:  [phone]  Name: Jaslen Adcox MRN: [id number] Date of Birth: January 06, 1935

## 2021-04-24 ENCOUNTER — Encounter: Payer: Self-pay | Admitting: Physical Therapy

## 2021-04-25 ENCOUNTER — Other Ambulatory Visit: Payer: Self-pay

## 2021-04-25 ENCOUNTER — Ambulatory Visit: Payer: Medicare PPO | Admitting: Physical Therapy

## 2021-04-25 ENCOUNTER — Encounter: Payer: Self-pay | Admitting: Physical Therapy

## 2021-04-25 DIAGNOSIS — R262 Difficulty in walking, not elsewhere classified: Secondary | ICD-10-CM

## 2021-04-25 DIAGNOSIS — M25552 Pain in left hip: Secondary | ICD-10-CM | POA: Diagnosis not present

## 2021-04-25 DIAGNOSIS — M6281 Muscle weakness (generalized): Secondary | ICD-10-CM

## 2021-04-25 DIAGNOSIS — M5442 Lumbago with sciatica, left side: Secondary | ICD-10-CM | POA: Diagnosis not present

## 2021-04-25 NOTE — Therapy (Signed)
[facility] [address]. [place], [place], [address] Phone: [phone]   Fax:  [phone]  Physical Therapy Treatment  Patient Details  Name: Jill Dunlap MRN: [id number] Date of Birth: [birth year] Referring Provider (PT): Rosette Reveal, MD   Encounter Date: 04/25/2021   PT End of Session - 04/26/21 1326     Visit Number 11    Number of Visits 17    Date for PT Re-Evaluation 05/16/21    Authorization Type VL: based on auth.    Authorization Time Period Auth 9/1-10/27; Cert 0/17-79/39    Authorization - Visit Number 11   corrected error in authorization visit count; initial evaluation was not included in initial authorization visit count   Authorization - Number of Visits 17    Progress Note Due on Visit 74    PT Start Time 1032    PT Stop Time 1112    PT Time Calculation (min) 40 min    Activity Tolerance Patient tolerated treatment well    Behavior During Therapy WFL for tasks assessed/performed             Past Medical History:  Diagnosis Date   Acid reflux    Allergy    Cancer ([place])    skin ca on leg head and back   Celiac disease    Current moderate episode of major depressive disorder without prior episode ([place])    Gastroesophageal reflux disease without esophagitis    Hyperlipidemia    Hypertension    Hypothyroidism    OP (osteoporosis)    Piriformis syndrome, left 02/12/2021   Pre-diabetes    Primary osteoarthritis of left hip    Type II diabetes mellitus with complication (HCC)    Vertigo    no episodes in several years   Wears dentures    partial lower    Past Surgical History:  Procedure Laterality Date   ABDOMINAL HYSTERECTOMY  1984   for bleeding   Basil cell     BILATERAL SALPINGOOPHORECTOMY  1984   CATARACT EXTRACTION W/PHACO Right 11/24/2016   Procedure: CATARACT EXTRACTION PHACO AND INTRAOCULAR LENS PLACEMENT ([place]) Right;  Surgeon: Leandrew Koyanagi, MD;  Location: [place];  Service: Ophthalmology;  Laterality: Right;   CATARACT EXTRACTION W/PHACO Left 02/04/2017   Procedure: CATARACT EXTRACTION PHACO AND INTRAOCULAR LENS PLACEMENT ([place])  Left;  Surgeon: Leandrew Koyanagi, MD;  Location: [place];  Service: Ophthalmology;  Laterality: Left;   ORIF WRIST FRACTURE Left 2015   TYMPANOPLASTY  1992    There were no vitals filed for this visit.   Subjective Assessment - 04/26/21 1325     Subjective Patient reports 0-1/10 pain at arrival to PT. She reports tolerating last session well. Patient is compliant with her established home exercise program and feels she is tolerating ADLs relatively well at this time. She reports completing walk around [employer] without notable increase in pain.    Pertinent History Pateint is an 86 year old female with primary complaint of left lower limb pain, left hip pain. Patient reports L lower limb pain down to her L foot with tingling down to her toes. She reports sometimes it feels like it starts in the hip and goes down - sometimes it seems to begin in her lower leg and moves up to hip. She reports one episode of numbness. Hx of Type II DM. Patient reports that she developed PMR in May-June 2021; she states that this is where her  issues began. PMR improved with use of prednisone. She is still tapering from prednisone to date. She states that as PMR has improved and then her L lower quarter pain started. She has children that live fairly close but are not readily available always. She states that her most recent condition began around beginning of August. She states that change in footwear didn't help. She denies night pain - sometimes she has difficulty getting to sleep initially. She reports pain with putting weight on LLE in AM. Hx of depression following her husband's death. No bowel/bladder changes. She states that sometimes she thinks bending may help her feel better.    Limitations  Walking;Standing;Sitting;House hold activities    How long can you walk comfortably? Minimal distance when pain is increased    Diagnostic tests Radiograph, see below    Patient Stated Goals Goals: Able to take care of herself without pain    Pain Onset More than a month ago                 TREATMENT      NuStep x 5 minutes, Level 1; seat 5, arms 8 - unbilled time    Manual Therapy - for symptom modulation, soft tissue sensitivity and mobility as needed for hip complex ROM    Sidelying R: STM/DTM and IASTM with Hypervolt along L gluteus medius and piriformis/gemelli complex   TPR along L piriformis (reproduces referred pain down LLE)      *not today* LLE long-leg distraction in supine, gr II STM L distal gastroc     Neuromuscular Re-education - for nervous system downregulation, gluteal musculature activation and exercises to promote LE kinetic chain stability   Hip ABD and ADD isometrics; 2x10, 3 sec (with PT manual resistance in supine hooklying today)  Sidelying Clamshell (lying on R); x20, with Red resistance band Glute Bridge, supine; 2x10, partial range    *not today* Supine Pelvic tilt; 2x10 [emphasis on posterior tilt, moderate tactile and verbal cueing + demonstration]     Therapeutic Exercise - HEP establishment to improve carryover of in-clinic intervention, soft tissue mobility/extensibility, dural/neural mobility, repeated movement for symptom modulation/decreasing peripheral symptoms   Repeated lumbar flexion in sitting; x10     Sidestep in // bars; 3x D/B   Standing toe tap c 6-inch step; x10 alternating         *not today* Walking trial in clinic; ambulate 2.5 laps in 70-ft hallway in gym [reproduction of L gluteal pain] Repeated flexion in lying (double knee to chest with towel); 1x10 Repeated flexion in lying, with clinician overpressure (double knee to chest with towel); 1x10, 1 sec hold at end-range Lower trunk rotations,supine; x10  ea dir  Supine piriformis stretch; x 1 min Seated sciatic glider; 2x10        *not today* Cold pack (unbilled) utilized at end of sesion for analgesic effect and anti-inflammatory effect along L gluteal region, x5 minutes in R sidelying         ASSESSMENT Patient has made excellent progress over the previous week with activity tolerance and improved ability to load hip complex and gluteal mm without significant reproduction of referred LLE symptoms. Patient does have mild onset of L lateral leg pain at end of session that is delayed following completion of lateral stepping drill - this is mitigated with use of repeated flexion in sitting. Patient has made notable progress in intensity of symptoms, tolerance of lumbopelvic mobility and ability to load hip complex, and gait quality; she  is able to progress notably with gluteal graded loading. She should continue to benefit from current PT POC with gradual progression of strengthening/graded activity as tolerated.         PT Short Term Goals - 04/24/21 4332       PT SHORT TERM GOAL #1   Title Pt will be independent and 100% compliant with established HEP and activity modification as needed to augment PT intervention and prevent flare-up of back pain as needed for best return to prior level of function.    Baseline 03/21/21: Baseline HEP initiated.    04/23/21: Compliant and independent with HEP, pt modifying volume of walking and aggravating positions/postures    Time 3    Period Weeks    Status Achieved    Target Date 04/11/21               PT Long Term Goals - 04/24/21 0829       PT LONG TERM GOAL #1   Title Patient will demonstrate improved function as evidenced by a score of 68 on FOTO measure for full participation in activities at home and in the community.    Baseline 03/21/21: 55.   04/23/21: 55    Time 8    Period Weeks    Status Not Met    Target Date 05/16/21      PT LONG TERM GOAL #2   Title Patient will have full  thoracolumbar AROM without reproduction of pain as needed for functional reaching, self-care ADLs, bed mobility, household chores.    Baseline 03/21/21: Pain and motion loss with extension and R rotation, pain with L rotation.  04/23/21: Mild motion loss with extension and bilateral rotation, L gluteal pain with L rotation    Time 8    Period Weeks    Status Partially Met    Target Date 05/16/21      PT LONG TERM GOAL #3   Title Patient will have MMT for all tested musculature 4+/5 or greater without reproduction of pain indicative of improved capacity for loading paraspinal/pelvic and gluteal mm as needed for performance of transferring, household chores, prolonged weightbearing tasks    Baseline 03/21/21: Hip flexion 3+, Hip ABD 4, Quad 4-, Hamtrings 3+.   04/23/21: L hip flexion 4-, hip ABD 4+, Quad 4+, [place] 4-    Time 8    Period Weeks    Status Partially Met    Target Date 05/16/21      PT LONG TERM GOAL #4   Title Patient will be able to ambulate for home-mobility distance (150 ft or greater) with no increase in pain > 1-2/10 as needed for accessing her home    Baseline 03/21/21: Difficulty with walking home-mobility distance due to L lower limb pain.   04/23/21: Improved gait pattern, but reproduction of gluteal pain with walking 150 ft in clinic at 3/10 intensity    Time 8    Period Weeks    Status Not Met    Target Date 05/16/21                   Plan - 04/26/21 1334     Clinical Impression Statement Patient has made excellent progress over the previous week with activity tolerance and improved ability to load hip complex and gluteal mm without significant reproduction of referred LLE symptoms. Patient does have mild onset of L lateral leg pain at end of session that is delayed following completion of lateral stepping drill - this  is mitigated with use of repeated flexion in sitting. Patient has made notable progress in intensity of symptoms, tolerance of lumbopelvic mobility  and ability to load hip complex, and gait quality; she is able to progress notably with gluteal graded loading. She should continue to benefit from current PT POC with gradual progression of strengthening/graded activity as tolerated.    Personal Factors and Comorbidities Age;Comorbidity 3+    Comorbidities Osteoporosis, Type II DM, depression, celiac disease, GERD, Hx of skin cancer    Examination-Activity Limitations Locomotion Level;Sit;Stand;Stairs;Transfers    Examination-Participation Restrictions Church;Community Activity    Stability/Clinical Decision Making Evolving/Moderate complexity    Rehab Potential Good    PT Frequency 2x / week    PT Duration 8 weeks    PT Treatment/Interventions Cryotherapy;Electrical Stimulation;Moist Heat;Therapeutic activities;Therapeutic exercise;Neuromuscular re-education;Manual techniques;Dry needling    PT Next Visit Plan Manual therapy and neurodynamics to improve L buttock and LE referred symptoms, repeated movement prn, and continued progression of gluteal strengthening. Flexion bias repeated movement prn for symptom modulation. Recommend continued PT 2x/week for 4 weeks.    PT Home Exercise Plan Access Code [email or web address]    Consulted and Agree with Plan of Care Patient             Patient will benefit from skilled therapeutic intervention in order to improve the following deficits and impairments:  Abnormal gait, Decreased strength, Impaired sensation, Pain, Decreased range of motion, Impaired flexibility, Postural dysfunction  Visit Diagnosis: Pain in left hip  Left-sided low back pain with left-sided sciatica, unspecified chronicity  Difficulty in walking, not elsewhere classified  Muscle weakness (generalized)     Problem List Patient Active Problem List   Diagnosis Date Noted   Left lumbar radiculopathy 03/13/2021   Piriformis syndrome, left 02/12/2021   Aortic atherosclerosis ([place]) 08/01/2020   Disorder of bursae of shoulder region  02/02/2020   PMR (polymyalgia rheumatica) ([place]) 02/02/2020   Primary osteoarthritis of left hip 01/24/2020   Recurrent major depression in partial remission ([place]) 12/28/2019   Diffuse photodamage of skin 02/13/2016   Basal cell carcinoma 01/11/2016   Gastroesophageal reflux disease without esophagitis 07/03/2015   Type II diabetes mellitus with complication ([place]) 94/49/6759   Acquired hypothyroidism 10/31/2014   Allergic rhinitis 10/31/2014   Benign hypertension 10/31/2014   CD (celiac disease) 10/31/2014   Dyslipidemia 10/31/2014   OP (osteoporosis) 10/31/2014   Valentina Gu, PT, DPT #[id number]  Eilleen Kempf, PT 04/26/2021, 1:34 PM  [facility] [address]. [place], [place], [address] Phone: [phone]   Fax:  [phone]  Name: Jill Dunlap MRN: [id number] Date of Birth: 1934/08/04

## 2021-04-30 ENCOUNTER — Ambulatory Visit: Payer: Medicare PPO | Admitting: Physical Therapy

## 2021-04-30 ENCOUNTER — Encounter: Payer: Self-pay | Admitting: Physical Therapy

## 2021-04-30 ENCOUNTER — Other Ambulatory Visit: Payer: Self-pay

## 2021-04-30 DIAGNOSIS — M25552 Pain in left hip: Secondary | ICD-10-CM

## 2021-04-30 DIAGNOSIS — M6281 Muscle weakness (generalized): Secondary | ICD-10-CM | POA: Diagnosis not present

## 2021-04-30 DIAGNOSIS — R262 Difficulty in walking, not elsewhere classified: Secondary | ICD-10-CM

## 2021-04-30 DIAGNOSIS — M5442 Lumbago with sciatica, left side: Secondary | ICD-10-CM

## 2021-04-30 NOTE — Therapy (Signed)
[facility] [address] [place], [place], [address] Phone: [phone]   Fax:  [phone]  Physical Therapy Treatment  Patient Details  Name: Berdine Rasmusson MRN: [id number] Date of Birth: 10/07/1934 Referring Provider (PT): Rosette Reveal, MD   Encounter Date: 04/30/2021   PT End of Session - 04/30/21 1040     Visit Number 12    Number of Visits 17    Date for PT Re-Evaluation 05/16/21    Authorization Type VL: based on auth.    Authorization Time Period Auth 9/1-10/27; Cert 1/77-11/65    Authorization - Visit Number 12   corrected error in authorization visit count; initial evaluation was not included in initial authorization visit count   Authorization - Number of Visits 17    Progress Note Due on Visit 52    PT Start Time 1033    PT Stop Time 1113    PT Time Calculation (min) 40 min    Activity Tolerance Patient tolerated treatment well    Behavior During Therapy WFL for tasks assessed/performed             Past Medical History:  Diagnosis Date   Acid reflux    Allergy    Cancer ([place])    skin ca on leg head and back   Celiac disease    Current moderate episode of major depressive disorder without prior episode ([place])    Gastroesophageal reflux disease without esophagitis    Hyperlipidemia    Hypertension    Hypothyroidism    OP (osteoporosis)    Piriformis syndrome, left 02/12/2021   Pre-diabetes    Primary osteoarthritis of left hip    Type II diabetes mellitus with complication ([place])    Vertigo    no episodes in several years   Wears dentures    partial lower    Past Surgical History:  Procedure Laterality Date   ABDOMINAL HYSTERECTOMY  1984   for bleeding   Basil cell     BILATERAL SALPINGOOPHORECTOMY  1984   CATARACT EXTRACTION W/PHACO Right 11/24/2016   Procedure: CATARACT EXTRACTION PHACO AND INTRAOCULAR LENS PLACEMENT ([place]) Right;  Surgeon: Leandrew Koyanagi, MD;  Location: [facility];  Service: Ophthalmology;  Laterality: Right;   CATARACT EXTRACTION W/PHACO Left 02/04/2017   Procedure: CATARACT EXTRACTION PHACO AND INTRAOCULAR LENS PLACEMENT ([place])  Left;  Surgeon: Leandrew Koyanagi, MD;  Location: [facility];  Service: Ophthalmology;  Laterality: Left;   ORIF WRIST FRACTURE Left 2015   TYMPANOPLASTY  1992    There were no vitals filed for this visit.   Subjective Assessment - 04/30/21 1038     Subjective Patient reports doing well with recent grocery shopping recently. Patient reports no pain at arrival to PT. She feels she is doing well at this time. Patient reports tolerating her last visit well. Patient is compliant with her home program.    Pertinent History Pateint is an 86 year old female with primary complaint of left lower limb pain, left hip pain. Patient reports L lower limb pain down to her L foot with tingling down to her toes. She reports sometimes it feels like it starts in the hip and goes down - sometimes it seems to begin in her lower leg and moves up to hip. She reports one episode of numbness. Hx of Type II DM. Patient reports that she developed PMR in May-June 2021; she states that this is where her issues began. PMR improved with use  of prednisone. She is still tapering from prednisone to date. She states that as PMR has improved and then her L lower quarter pain started. She has children that live fairly close but are not readily available always. She states that her most recent condition began around beginning of August. She states that change in footwear didn't help. She denies night pain - sometimes she has difficulty getting to sleep initially. She reports pain with putting weight on LLE in AM. Hx of depression following her husband's death. No bowel/bladder changes. She states that sometimes she thinks bending may help her feel better.    Limitations Walking;Standing;Sitting;House hold activities    How long can you walk  comfortably? Minimal distance when pain is increased    Diagnostic tests Radiograph, see below    Patient Stated Goals Goals: Able to take care of herself without pain    Currently in Pain? No/denies    Pain Onset More than a month ago               TREATMENT      NuStep x 5 minutes, Level 2; seat 5, arms 8 - unbilled time     Manual Therapy - for symptom modulation, soft tissue sensitivity and mobility as needed for hip complex ROM   LLE long-leg distraction in supine, gr II   Sidelying R: STM/DTM along L gluteus medius and piriformis/gemelli complex      *not today* STM L distal gastroc TPR along L piriformis (reproduces referred pain down LLE)      Neuromuscular Re-education - for nervous system downregulation, gluteal musculature activation and exercises to promote LE kinetic chain stability      Sidelying Clamshell (lying on R); x20, with Green resistance band Glute Bridge, supine; 2x10, partial range Standing 3-way hip; x10 ea dir (standing in // bars)   *not today* Hip ABD and ADD isometrics; 2x10, 3 sec (with PT manual resistance in supine hooklying today)  Supine Pelvic tilt; 2x10 [emphasis on posterior tilt, moderate tactile and verbal cueing + demonstration]     Therapeutic Exercise - HEP establishment to improve carryover of in-clinic intervention, soft tissue mobility/extensibility, dural/neural mobility, repeated movement for symptom modulation/decreasing peripheral symptoms   Sit to stand; 2x8, edge of table   Sidestep in // bars; 3x D/B with Grn Tband above knees   Standing toe tap c 6-inch step; 2x10 alternating  Sit to stand; standard chair height; 2x8         *not today* Repeated lumbar flexion in sitting; x10   Walking trial in clinic; ambulate 2.5 laps in 70-ft hallway in gym [reproduction of L gluteal pain] Repeated flexion in lying (double knee to chest with towel); 1x10 Repeated flexion in lying, with clinician overpressure  (double knee to chest with towel); 1x10, 1 sec hold at end-range Lower trunk rotations,supine; x10 ea dir  Supine piriformis stretch; x 1 min Seated sciatic glider; 2x10        *not today* Cold pack (unbilled) utilized at end of sesion for analgesic effect and anti-inflammatory effect along L gluteal region, x5 minutes in R sidelying         ASSESSMENT Patient is able to continue progression of progressive gluteal strengthening without reproduction of symptoms. She exhibits improving gait pattern with sound weight shift onto each lower limb (demonstrated during toe tapping drill and gait in clinic today). Patient has maintained home program well to date and has minimal complaints of gluteal or lower limb pain. She should continue  to benefit from current PT POC with gradual progression of strengthening/graded activity as tolerated.          PT Short Term Goals - 04/24/21 1610       PT SHORT TERM GOAL #1   Title Pt will be independent and 100% compliant with established HEP and activity modification as needed to augment PT intervention and prevent flare-up of back pain as needed for best return to prior level of function.    Baseline 03/21/21: Baseline HEP initiated.    04/23/21: Compliant and independent with HEP, pt modifying volume of walking and aggravating positions/postures    Time 3    Period Weeks    Status Achieved    Target Date 04/11/21               PT Long Term Goals - 04/24/21 0829       PT LONG TERM GOAL #1   Title Patient will demonstrate improved function as evidenced by a score of 68 on FOTO measure for full participation in activities at home and in the community.    Baseline 03/21/21: 55.   04/23/21: 55    Time 8    Period Weeks    Status Not Met    Target Date 05/16/21      PT LONG TERM GOAL #2   Title Patient will have full thoracolumbar AROM without reproduction of pain as needed for functional reaching, self-care ADLs, bed mobility, household chores.     Baseline 03/21/21: Pain and motion loss with extension and R rotation, pain with L rotation.  04/23/21: Mild motion loss with extension and bilateral rotation, L gluteal pain with L rotation    Time 8    Period Weeks    Status Partially Met    Target Date 05/16/21      PT LONG TERM GOAL #3   Title Patient will have MMT for all tested musculature 4+/5 or greater without reproduction of pain indicative of improved capacity for loading paraspinal/pelvic and gluteal mm as needed for performance of transferring, household chores, prolonged weightbearing tasks    Baseline 03/21/21: Hip flexion 3+, Hip ABD 4, Quad 4-, Hamtrings 3+.   04/23/21: L hip flexion 4-, hip ABD 4+, Quad 4+, [place] 4-    Time 8    Period Weeks    Status Partially Met    Target Date 05/16/21      PT LONG TERM GOAL #4   Title Patient will be able to ambulate for home-mobility distance (150 ft or greater) with no increase in pain > 1-2/10 as needed for accessing her home    Baseline 03/21/21: Difficulty with walking home-mobility distance due to L lower limb pain.   04/23/21: Improved gait pattern, but reproduction of gluteal pain with walking 150 ft in clinic at 3/10 intensity    Time 8    Period Weeks    Status Not Met    Target Date 05/16/21                   Plan - 04/30/21 1252     Clinical Impression Statement Patient is able to continue progression of progressive gluteal strengthening without reproduction of symptoms. She exhibits improving gait pattern with sound weight shift onto each lower limb (demonstrated during toe tapping drill and gait in clinic today). Patient has maintained home program well to date and has minimal complaints of gluteal or lower limb pain. She should continue to benefit from current PT POC with  gradual progression of strengthening/graded activity as tolerated.    Personal Factors and Comorbidities Age;Comorbidity 3+    Comorbidities Osteoporosis, Type II DM, depression, celiac  disease, GERD, Hx of skin cancer    Examination-Activity Limitations Locomotion Level;Sit;Stand;Stairs;Transfers    Examination-Participation Restrictions Church;Community Activity    Stability/Clinical Decision Making Evolving/Moderate complexity    Rehab Potential Good    PT Frequency 2x / week    PT Duration 8 weeks    PT Treatment/Interventions Cryotherapy;Electrical Stimulation;Moist Heat;Therapeutic activities;Therapeutic exercise;Neuromuscular re-education;Manual techniques;Dry needling    PT Next Visit Plan Manual therapy and neurodynamics to improve L buttock and LE referred symptoms, repeated movement prn, and continued progression of gluteal strengthening. Flexion bias repeated movement prn for symptom modulation as needed.    PT Home Exercise Plan Access Code [email or web address]    Consulted and Agree with Plan of Care Patient             Patient will benefit from skilled therapeutic intervention in order to improve the following deficits and impairments:  Abnormal gait, Decreased strength, Impaired sensation, Pain, Decreased range of motion, Impaired flexibility, Postural dysfunction  Visit Diagnosis: Pain in left hip  Left-sided low back pain with left-sided sciatica, unspecified chronicity  Difficulty in walking, not elsewhere classified  Muscle weakness (generalized)     Problem List Patient Active Problem List   Diagnosis Date Noted   Left lumbar radiculopathy 03/13/2021   Piriformis syndrome, left 02/12/2021   Aortic atherosclerosis ([place]) 08/01/2020   Disorder of bursae of shoulder region 02/02/2020   PMR (polymyalgia rheumatica) ([place]) 02/02/2020   Primary osteoarthritis of left hip 01/24/2020   Recurrent major depression in partial remission ([place]) 12/28/2019   Diffuse photodamage of skin 02/13/2016   Basal cell carcinoma 01/11/2016   Gastroesophageal reflux disease without esophagitis 07/03/2015   Type II diabetes mellitus with complication ([place]) 94/50/3888    Acquired hypothyroidism 10/31/2014   Allergic rhinitis 10/31/2014   Benign hypertension 10/31/2014   CD (celiac disease) 10/31/2014   Dyslipidemia 10/31/2014   OP (osteoporosis) 10/31/2014   Valentina Gu, PT, DPT #[id number]  Eilleen Kempf, PT 04/30/2021, 12:52 PM  [facility] [address] [place], [place], [address] Phone: [phone]   Fax:  [phone]  Name: Mylan Lengyel MRN: [id number] Date of Birth: May 18, 1935

## 2021-05-02 ENCOUNTER — Other Ambulatory Visit: Payer: Self-pay

## 2021-05-02 ENCOUNTER — Ambulatory Visit: Payer: Medicare PPO | Admitting: Physical Therapy

## 2021-05-02 ENCOUNTER — Encounter: Payer: Self-pay | Admitting: Physical Therapy

## 2021-05-02 DIAGNOSIS — M5442 Lumbago with sciatica, left side: Secondary | ICD-10-CM

## 2021-05-02 DIAGNOSIS — M25552 Pain in left hip: Secondary | ICD-10-CM | POA: Diagnosis not present

## 2021-05-02 DIAGNOSIS — R262 Difficulty in walking, not elsewhere classified: Secondary | ICD-10-CM

## 2021-05-02 DIAGNOSIS — M6281 Muscle weakness (generalized): Secondary | ICD-10-CM | POA: Diagnosis not present

## 2021-05-02 NOTE — Therapy (Signed)
[facility] [address] [place], [place], [address] Phone: [phone]   Fax:  [phone]  Physical Therapy Treatment  Patient Details  Name: Jill Dunlap MRN: [id number] Date of Birth: 24-Aug-1934 Referring Provider (PT): Rosette Reveal, MD   Encounter Date: 05/02/2021   PT End of Session - 05/02/21 1038     Visit Number 13    Number of Visits 17    Date for PT Re-Evaluation 05/16/21    Authorization Type VL: based on auth.    Authorization Time Period Auth 9/1-10/27; Cert 0/25-42/70; prog note 04/23/21    Authorization - Visit Number 12   corrected error in authorization visit count; initial evaluation was not included in initial authorization visit count   Authorization - Number of Visits 17    Progress Note Due on Visit 87    PT Start Time 1032    PT Stop Time 1110    PT Time Calculation (min) 38 min    Activity Tolerance Patient tolerated treatment well    Behavior During Therapy WFL for tasks assessed/performed             Past Medical History:  Diagnosis Date   Acid reflux    Allergy    Cancer ([place])    skin ca on leg head and back   Celiac disease    Current moderate episode of major depressive disorder without prior episode ([place])    Gastroesophageal reflux disease without esophagitis    Hyperlipidemia    Hypertension    Hypothyroidism    OP (osteoporosis)    Piriformis syndrome, left 02/12/2021   Pre-diabetes    Primary osteoarthritis of left hip    Type II diabetes mellitus with complication ([place])    Vertigo    no episodes in several years   Wears dentures    partial lower    Past Surgical History:  Procedure Laterality Date   ABDOMINAL HYSTERECTOMY  1984   for bleeding   Basil cell     BILATERAL SALPINGOOPHORECTOMY  1984   CATARACT EXTRACTION W/PHACO Right 11/24/2016   Procedure: CATARACT EXTRACTION PHACO AND INTRAOCULAR LENS PLACEMENT ([place]) Right;  Surgeon: Leandrew Koyanagi, MD;   Location: [facility];  Service: Ophthalmology;  Laterality: Right;   CATARACT EXTRACTION W/PHACO Left 02/04/2017   Procedure: CATARACT EXTRACTION PHACO AND INTRAOCULAR LENS PLACEMENT ([place])  Left;  Surgeon: Leandrew Koyanagi, MD;  Location: [facility];  Service: Ophthalmology;  Laterality: Left;   ORIF WRIST FRACTURE Left 2015   TYMPANOPLASTY  1992    There were no vitals filed for this visit.   Subjective Assessment - 05/02/21 1036     Subjective Patient denies pain at arrival to PT. She reports feeling "not limber" at arrival. Patient reports compliance with her HEP. Patient reports no long-distance walks since her last PT visit.    Pertinent History Pateint is an 86 year old female with primary complaint of left lower limb pain, left hip pain. Patient reports L lower limb pain down to her L foot with tingling down to her toes. She reports sometimes it feels like it starts in the hip and goes down - sometimes it seems to begin in her lower leg and moves up to hip. She reports one episode of numbness. Hx of Type II DM. Patient reports that she developed PMR in May-June 2021; she states that this is where her issues began. PMR improved with use of prednisone. She is still tapering from  prednisone to date. She states that as PMR has improved and then her L lower quarter pain started. She has children that live fairly close but are not readily available always. She states that her most recent condition began around beginning of August. She states that change in footwear didn't help. She denies night pain - sometimes she has difficulty getting to sleep initially. She reports pain with putting weight on LLE in AM. Hx of depression following her husband's death. No bowel/bladder changes. She states that sometimes she thinks bending may help her feel better.    Limitations Walking;Standing;Sitting;House hold activities    How long can you walk comfortably? Minimal distance when pain is  increased    Diagnostic tests Radiograph, see below    Patient Stated Goals Goals: Able to take care of herself without pain    Currently in Pain? No/denies    Pain Onset More than a month ago               TREATMENT      NuStep x 5 minutes, Level 3; seat 5, arms 7 - unbilled time     Manual Therapy - for symptom modulation, soft tissue sensitivity and mobility as needed for hip complex ROM    LLE long-leg distraction in supine, gr II    Sidelying R: STM/DTM along L gluteus medius and piriformis/gemelli complex [no tenderness to palpation today]       *not today* STM L distal gastroc TPR along L piriformis (reproduces referred pain down LLE)       Neuromuscular Re-education - for nervous system downregulation, gluteal musculature activation and exercises to promote LE kinetic chain stability       Sidelying Hip Abduction; 2x8 [moderate tactile and verbal cueing to attain neutral hip position versus flexed hip in sidelying] Glute Bridge, supine; 2x10, partial range, with Red Tband above knees  Standing 3-way hip; x8 ea dir (standing in // bars)   *not today* Sidelying Clamshell (lying on R); x20, with Green resistance band Hip ABD and ADD isometrics; 2x10, 3 sec (with PT manual resistance in supine hooklying today)  Supine Pelvic tilt; 2x10 [emphasis on posterior tilt, moderate tactile and verbal cueing + demonstration]     Therapeutic Exercise - HEP establishment to improve carryover of in-clinic intervention, soft tissue mobility/extensibility, dural/neural mobility, repeated movement for symptom modulation/decreasing peripheral symptoms   Sit to stand; 2x8, edge of table   Sidestep in // bars; 4x D/B with Red Tband at ankles   Standing toe tap c 6-inch step; 2x10 alternating  Minisquat with light touch bilat UE on // bars; 20x         *not today* Repeated lumbar flexion in sitting; x10   Walking trial in clinic; ambulate 2.5 laps in 70-ft hallway in  gym [reproduction of L gluteal pain] Repeated flexion in lying (double knee to chest with towel); 1x10 Repeated flexion in lying, with clinician overpressure (double knee to chest with towel); 1x10, 1 sec hold at end-range Lower trunk rotations,supine; x10 ea dir  Supine piriformis stretch; x 1 min Seated sciatic glider; 2x10        *not today* Cold pack (unbilled) utilized at end of sesion for analgesic effect and anti-inflammatory effect along L gluteal region, x5 minutes in R sidelying         ASSESSMENT Patient has minimal pain at this time. She reports fleeting paresthesias following bridge exercise today. Patient otherwise feels activation and fatigue in appropriate regions with each exercise  and has no exacerbation of primary symptoms. Patient is optimistic about her current progress and improved tolerance of gait and weightbearing activity. Pt is able to notably progress graded loading/gluteal strengthening in clinic. She should continue to benefit from current PT POC with gradual progression of strengthening/graded activity as tolerated.  Anticipate discharge within patient's currently established POC.         PT Short Term Goals - 04/24/21 1696       PT SHORT TERM GOAL #1   Title Pt will be independent and 100% compliant with established HEP and activity modification as needed to augment PT intervention and prevent flare-up of back pain as needed for best return to prior level of function.    Baseline 03/21/21: Baseline HEP initiated.    04/23/21: Compliant and independent with HEP, pt modifying volume of walking and aggravating positions/postures    Time 3    Period Weeks    Status Achieved    Target Date 04/11/21               PT Long Term Goals - 04/24/21 0829       PT LONG TERM GOAL #1   Title Patient will demonstrate improved function as evidenced by a score of 68 on FOTO measure for full participation in activities at home and in the community.    Baseline  03/21/21: 55.   04/23/21: 55    Time 8    Period Weeks    Status Not Met    Target Date 05/16/21      PT LONG TERM GOAL #2   Title Patient will have full thoracolumbar AROM without reproduction of pain as needed for functional reaching, self-care ADLs, bed mobility, household chores.    Baseline 03/21/21: Pain and motion loss with extension and R rotation, pain with L rotation.  04/23/21: Mild motion loss with extension and bilateral rotation, L gluteal pain with L rotation    Time 8    Period Weeks    Status Partially Met    Target Date 05/16/21      PT LONG TERM GOAL #3   Title Patient will have MMT for all tested musculature 4+/5 or greater without reproduction of pain indicative of improved capacity for loading paraspinal/pelvic and gluteal mm as needed for performance of transferring, household chores, prolonged weightbearing tasks    Baseline 03/21/21: Hip flexion 3+, Hip ABD 4, Quad 4-, Hamtrings 3+.   04/23/21: L hip flexion 4-, hip ABD 4+, Quad 4+, [place] 4-    Time 8    Period Weeks    Status Partially Met    Target Date 05/16/21      PT LONG TERM GOAL #4   Title Patient will be able to ambulate for home-mobility distance (150 ft or greater) with no increase in pain > 1-2/10 as needed for accessing her home    Baseline 03/21/21: Difficulty with walking home-mobility distance due to L lower limb pain.   04/23/21: Improved gait pattern, but reproduction of gluteal pain with walking 150 ft in clinic at 3/10 intensity    Time 8    Period Weeks    Status Not Met    Target Date 05/16/21                   Plan - 05/02/21 1529     Clinical Impression Statement Patient has minimal pain at this time. She reports fleeting paresthesias following bridge exercise today. Patient otherwise feels activation and fatigue in  appropriate regions with each exercise and has no exacerbation of primary symptoms. Patient is optimistic about her current progress and improved tolerance of gait and  weightbearing activity. Pt is able to notably progress graded loading/gluteal strengthening in clinic. She should continue to benefit from current PT POC with gradual progression of strengthening/graded activity as tolerated.  Anticipate discharge within patient's currently established POC.    Personal Factors and Comorbidities Age;Comorbidity 3+    Comorbidities Osteoporosis, Type II DM, depression, celiac disease, GERD, Hx of skin cancer    Examination-Activity Limitations Locomotion Level;Sit;Stand;Stairs;Transfers    Examination-Participation Restrictions Church;Community Activity    Stability/Clinical Decision Making Evolving/Moderate complexity    Rehab Potential Good    PT Frequency 2x / week    PT Duration 8 weeks    PT Treatment/Interventions Cryotherapy;Electrical Stimulation;Moist Heat;Therapeutic activities;Therapeutic exercise;Neuromuscular re-education;Manual techniques;Dry needling    PT Next Visit Plan Manual therapy and neurodynamics to improve L buttock and LE referred symptoms, repeated movement prn, and continued progression of gluteal strengthening. Flexion bias repeated movement prn for symptom modulation as needed.    PT Home Exercise Plan Access Code [email or web address]    Consulted and Agree with Plan of Care Patient             Patient will benefit from skilled therapeutic intervention in order to improve the following deficits and impairments:  Abnormal gait, Decreased strength, Impaired sensation, Pain, Decreased range of motion, Impaired flexibility, Postural dysfunction  Visit Diagnosis: Pain in left hip  Left-sided low back pain with left-sided sciatica, unspecified chronicity  Difficulty in walking, not elsewhere classified  Muscle weakness (generalized)     Problem List Patient Active Problem List   Diagnosis Date Noted   Left lumbar radiculopathy 03/13/2021   Piriformis syndrome, left 02/12/2021   Aortic atherosclerosis ([place]) 08/01/2020   Disorder of  bursae of shoulder region 02/02/2020   PMR (polymyalgia rheumatica) ([place]) 02/02/2020   Primary osteoarthritis of left hip 01/24/2020   Recurrent major depression in partial remission ([place]) 12/28/2019   Diffuse photodamage of skin 02/13/2016   Basal cell carcinoma 01/11/2016   Gastroesophageal reflux disease without esophagitis 07/03/2015   Type II diabetes mellitus with complication ([place]) 57/33/4483   Acquired hypothyroidism 10/31/2014   Allergic rhinitis 10/31/2014   Benign hypertension 10/31/2014   CD (celiac disease) 10/31/2014   Dyslipidemia 10/31/2014   OP (osteoporosis) 10/31/2014   Valentina Gu, PT, DPT #[id number]  Eilleen Kempf, PT 05/02/2021, 3:29 PM  [facility] [address] [place], [place], [address] Phone: [phone]   Fax:  [phone]  Name: Jill Dunlap MRN: [id number] Date of Birth: 12-06-1934

## 2021-05-07 ENCOUNTER — Other Ambulatory Visit: Payer: Self-pay

## 2021-05-07 ENCOUNTER — Ambulatory Visit: Payer: Medicare PPO | Admitting: Physical Therapy

## 2021-05-07 ENCOUNTER — Encounter: Payer: Self-pay | Admitting: Physical Therapy

## 2021-05-07 DIAGNOSIS — M6281 Muscle weakness (generalized): Secondary | ICD-10-CM | POA: Diagnosis not present

## 2021-05-07 DIAGNOSIS — M25552 Pain in left hip: Secondary | ICD-10-CM

## 2021-05-07 DIAGNOSIS — M5442 Lumbago with sciatica, left side: Secondary | ICD-10-CM

## 2021-05-07 DIAGNOSIS — R262 Difficulty in walking, not elsewhere classified: Secondary | ICD-10-CM | POA: Diagnosis not present

## 2021-05-07 NOTE — Therapy (Signed)
[place] [facility] [address] [place], [place], [address] Phone: [phone]   Fax:  [phone]  Physical Therapy Treatment  Patient Details  Name: Jill Dunlap MRN: [id number] Date of Birth: 04/05/1935 Referring Provider (PT): Rosette Reveal, MD   Encounter Date: 05/07/2021   PT End of Session - 05/07/21 1040     Visit Number 14    Number of Visits 17    Date for PT Re-Evaluation 05/16/21    Authorization Type VL: based on auth.    Authorization Time Period Auth 9/1-10/27; Cert 2/90-21/11; prog note 04/23/21    Authorization - Visit Number 14   corrected error in authorization visit count; initial evaluation was not included in initial authorization visit count   Authorization - Number of Visits 17    Progress Note Due on Visit 78    PT Start Time 1032    PT Stop Time 1115    PT Time Calculation (min) 43 min    Activity Tolerance Patient tolerated treatment well    Behavior During Therapy WFL for tasks assessed/performed             Past Medical History:  Diagnosis Date   Acid reflux    Allergy    Cancer ([place])    skin ca on leg head and back   Celiac disease    Current moderate episode of major depressive disorder without prior episode ([place])    Gastroesophageal reflux disease without esophagitis    Hyperlipidemia    Hypertension    Hypothyroidism    OP (osteoporosis)    Piriformis syndrome, left 02/12/2021   Pre-diabetes    Primary osteoarthritis of left hip    Type II diabetes mellitus with complication (HCC)    Vertigo    no episodes in several years   Wears dentures    partial lower    Past Surgical History:  Procedure Laterality Date   ABDOMINAL HYSTERECTOMY  1984   for bleeding   Basil cell     BILATERAL SALPINGOOPHORECTOMY  1984   CATARACT EXTRACTION W/PHACO Right 11/24/2016   Procedure: CATARACT EXTRACTION PHACO AND INTRAOCULAR LENS PLACEMENT ([place]) Right;  Surgeon: Leandrew Koyanagi, MD;   Location: [place];  Service: Ophthalmology;  Laterality: Right;   CATARACT EXTRACTION W/PHACO Left 02/04/2017   Procedure: CATARACT EXTRACTION PHACO AND INTRAOCULAR LENS PLACEMENT ([place])  Left;  Surgeon: Leandrew Koyanagi, MD;  Location: [facility];  Service: Ophthalmology;  Laterality: Left;   ORIF WRIST FRACTURE Left 2015   TYMPANOPLASTY  1992    There were no vitals filed for this visit.   Subjective Assessment - 05/07/21 1039     Subjective Patient reports feeling generally well at arrival to PT today. She reports no pain at arrival to PT. She had to attend funeral service this past weekend and reports tolerating prolonged weightbearing through the day well. She reports feeling sore until yesterday with some minor soreness remaining today, but she did not have notable onset of pain through the weekend.    Pertinent History Pateint is an 86 year old female with primary complaint of left lower limb pain, left hip pain. Patient reports L lower limb pain down to her L foot with tingling down to her toes. She reports sometimes it feels like it starts in the hip and goes down - sometimes it seems to begin in her lower leg and moves up to hip. She reports one episode of numbness. Hx of Type II  DM. Patient reports that she developed PMR in May-June 2021; she states that this is where her issues began. PMR improved with use of prednisone. She is still tapering from prednisone to date. She states that as PMR has improved and then her L lower quarter pain started. She has children that live fairly close but are not readily available always. She states that her most recent condition began around beginning of August. She states that change in footwear didn't help. She denies night pain - sometimes she has difficulty getting to sleep initially. She reports pain with putting weight on LLE in AM. Hx of depression following her husband's death. No bowel/bladder changes. She states that sometimes  she thinks bending may help her feel better.    Limitations Walking;Standing;Sitting;House hold activities    How long can you walk comfortably? Minimal distance when pain is increased    Diagnostic tests Radiograph, see below    Patient Stated Goals Goals: Able to take care of herself without pain    Currently in Pain? No/denies    Pain Onset More than a month ago                TREATMENT      NuStep x 5 minutes, Level 3; seat 6, arms 7 - unbilled time     Manual Therapy - for symptom modulation, soft tissue sensitivity and mobility as needed for hip complex ROM    LLE long-leg distraction in supine, gr II    Sidelying R: STM/DTM and IASTM with Hypervolt along L gluteus medius and piriformis/gemelli complex [no tenderness to palpation today]       *not today* STM L distal gastroc TPR along L piriformis (reproduces referred pain down LLE)       Neuromuscular Re-education - for nervous system downregulation, gluteal musculature activation and exercises to promote LE kinetic chain stability       Sidelying Hip Abduction; 2x8 [moderate tactile and verbal cueing to attain neutral hip position versus flexed hip in sidelying] Glute Bridge, supine; 2x10, partial range, with Green Tband above knees   *next visit* Standing 3-way hip; x8 ea dir (standing in // bars)   *not today* Sidelying Clamshell (lying on R); x20, with Green resistance band Hip ABD and ADD isometrics; 2x10, 3 sec (with PT manual resistance in supine hooklying today)  Supine Pelvic tilt; 2x10 [emphasis on posterior tilt, moderate tactile and verbal cueing + demonstration]     Therapeutic Exercise - HEP establishment to improve carryover of in-clinic intervention, soft tissue mobility/extensibility, dural/neural mobility, repeated movement for symptom modulation/decreasing peripheral symptoms   Sit to stand; 2x8, edge of table   Sidestep in // bars; 4x D/B with Red Tband at ankles   Standing toe tap  c 6-inch step; 2x10 alternating, standing on Airex pad            *not today* Minisquat with light touch bilat UE on // bars; 20x Repeated lumbar flexion in sitting; x10   Walking trial in clinic; ambulate 2.5 laps in 70-ft hallway in gym [reproduction of L gluteal pain] Repeated flexion in lying (double knee to chest with towel); 1x10 Repeated flexion in lying, with clinician overpressure (double knee to chest with towel); 1x10, 1 sec hold at end-range Lower trunk rotations,supine; x10 ea dir  Supine piriformis stretch; x 1 min Seated sciatic glider; 2x10        *not today* Cold pack (unbilled) utilized at end of sesion for analgesic effect and anti-inflammatory effect along  L gluteal region, x5 minutes in R sidelying         ASSESSMENT Patient has tolerated prolonged standing/walking well with recent funeral service she attended over the weekend. She has mild generalized gluteal soreness, but no remarkable radiating symptoms or paresthesias. She reports minimal pain at this time and is making excellent progress with PT, repeated movement program, and specific stretching. Pt is able to continue with gluteal strengthening/graded loading without reproduction of symptoms. She should continue to benefit from current PT POC with gradual progression of strengthening/graded activity as tolerated.  Anticipate discharge within patient's currently established POC.           PT Short Term Goals - 04/24/21 0071       PT SHORT TERM GOAL #1   Title Pt will be independent and 100% compliant with established HEP and activity modification as needed to augment PT intervention and prevent flare-up of back pain as needed for best return to prior level of function.    Baseline 03/21/21: Baseline HEP initiated.    04/23/21: Compliant and independent with HEP, pt modifying volume of walking and aggravating positions/postures    Time 3    Period Weeks    Status Achieved    Target Date 04/11/21                PT Long Term Goals - 04/24/21 0829       PT LONG TERM GOAL #1   Title Patient will demonstrate improved function as evidenced by a score of 68 on FOTO measure for full participation in activities at home and in the community.    Baseline 03/21/21: 55.   04/23/21: 55    Time 8    Period Weeks    Status Not Met    Target Date 05/16/21      PT LONG TERM GOAL #2   Title Patient will have full thoracolumbar AROM without reproduction of pain as needed for functional reaching, self-care ADLs, bed mobility, household chores.    Baseline 03/21/21: Pain and motion loss with extension and R rotation, pain with L rotation.  04/23/21: Mild motion loss with extension and bilateral rotation, L gluteal pain with L rotation    Time 8    Period Weeks    Status Partially Met    Target Date 05/16/21      PT LONG TERM GOAL #3   Title Patient will have MMT for all tested musculature 4+/5 or greater without reproduction of pain indicative of improved capacity for loading paraspinal/pelvic and gluteal mm as needed for performance of transferring, household chores, prolonged weightbearing tasks    Baseline 03/21/21: Hip flexion 3+, Hip ABD 4, Quad 4-, Hamtrings 3+.   04/23/21: L hip flexion 4-, hip ABD 4+, Quad 4+, [place] 4-    Time 8    Period Weeks    Status Partially Met    Target Date 05/16/21      PT LONG TERM GOAL #4   Title Patient will be able to ambulate for home-mobility distance (150 ft or greater) with no increase in pain > 1-2/10 as needed for accessing her home    Baseline 03/21/21: Difficulty with walking home-mobility distance due to L lower limb pain.   04/23/21: Improved gait pattern, but reproduction of gluteal pain with walking 150 ft in clinic at 3/10 intensity    Time 8    Period Weeks    Status Not Met    Target Date 05/16/21  Plan - 05/08/21 0753     Clinical Impression Statement Patient has tolerated prolonged standing/walking well with recent  funeral service she attended over the weekend. She has mild generalized gluteal soreness, but no remarkable radiating symptoms or paresthesias. She reports minimal pain at this time and is making excellent progress with PT, repeated movement program, and specific stretching. Pt is able to continue with gluteal strengthening/graded loading without reproduction of symptoms. She should continue to benefit from current PT POC with gradual progression of strengthening/graded activity as tolerated.  Anticipate discharge within patient's currently established POC.    Personal Factors and Comorbidities Age;Comorbidity 3+    Comorbidities Osteoporosis, Type II DM, depression, celiac disease, GERD, Hx of skin cancer    Examination-Activity Limitations Locomotion Level;Sit;Stand;Stairs;Transfers    Examination-Participation Restrictions Church;Community Activity    Stability/Clinical Decision Making Evolving/Moderate complexity    Rehab Potential Good    PT Frequency 2x / week    PT Duration 8 weeks    PT Treatment/Interventions Cryotherapy;Electrical Stimulation;Moist Heat;Therapeutic activities;Therapeutic exercise;Neuromuscular re-education;Manual techniques;Dry needling    PT Next Visit Plan Manual therapy and neurodynamics to improve L buttock and LE referred symptoms, repeated movement prn, and continued progression of gluteal strengthening. Flexion bias repeated movement prn for symptom modulation as needed.    PT Home Exercise Plan Access Code [email or web address]    Consulted and Agree with Plan of Care Patient             Patient will benefit from skilled therapeutic intervention in order to improve the following deficits and impairments:  Abnormal gait, Decreased strength, Impaired sensation, Pain, Decreased range of motion, Impaired flexibility, Postural dysfunction  Visit Diagnosis: Pain in left hip  Left-sided low back pain with left-sided sciatica, unspecified chronicity  Difficulty in walking,  not elsewhere classified  Muscle weakness (generalized)     Problem List Patient Active Problem List   Diagnosis Date Noted   Left lumbar radiculopathy 03/13/2021   Piriformis syndrome, left 02/12/2021   Aortic atherosclerosis ([place]) 08/01/2020   Disorder of bursae of shoulder region 02/02/2020   PMR (polymyalgia rheumatica) ([place]) 02/02/2020   Primary osteoarthritis of left hip 01/24/2020   Recurrent major depression in partial remission ([place]) 12/28/2019   Diffuse photodamage of skin 02/13/2016   Basal cell carcinoma 01/11/2016   Gastroesophageal reflux disease without esophagitis 07/03/2015   Type II diabetes mellitus with complication ([place]) 25/52/5894   Acquired hypothyroidism 10/31/2014   Allergic rhinitis 10/31/2014   Benign hypertension 10/31/2014   CD (celiac disease) 10/31/2014   Dyslipidemia 10/31/2014   OP (osteoporosis) 10/31/2014   Valentina Gu, PT, DPT #[id number]  Eilleen Kempf, PT 05/08/2021, 7:53 AM  [facility] [address] [place], [place], [address] Phone: [phone]   Fax:  [phone]  Name: Virgen Belland MRN: [id number] Date of Birth: 01/12/1935

## 2021-05-09 ENCOUNTER — Ambulatory Visit: Payer: Medicare PPO | Admitting: Physical Therapy

## 2021-05-09 ENCOUNTER — Other Ambulatory Visit: Payer: Self-pay

## 2021-05-09 DIAGNOSIS — M25552 Pain in left hip: Secondary | ICD-10-CM

## 2021-05-09 DIAGNOSIS — M5442 Lumbago with sciatica, left side: Secondary | ICD-10-CM | POA: Diagnosis not present

## 2021-05-09 DIAGNOSIS — R262 Difficulty in walking, not elsewhere classified: Secondary | ICD-10-CM | POA: Diagnosis not present

## 2021-05-09 DIAGNOSIS — M6281 Muscle weakness (generalized): Secondary | ICD-10-CM

## 2021-05-09 NOTE — Therapy (Signed)
[facility] [address] [place], [place], [address] Phone: [phone]   Fax:  [phone]  Physical Therapy Treatment/Goal Update  Patient Details  Name: Jill Dunlap MRN: [id number] Date of Birth: 1934-09-30 Referring Provider (PT): Rosette Reveal, MD   Encounter Date: 05/09/2021   PT End of Session - 05/09/21 1039     Visit Number 15    Number of Visits 17    Date for PT Re-Evaluation 05/16/21    Authorization Type VL: based on auth.    Authorization Time Period Auth 9/1-10/27; Cert 0/38-88/28; prog note 04/23/21    Authorization - Visit Number 15   corrected error in authorization visit count; initial evaluation was not included in initial authorization visit count   Authorization - Number of Visits 17    Progress Note Due on Visit 45    PT Start Time 1031    PT Stop Time 1114    PT Time Calculation (min) 43 min    Activity Tolerance Patient tolerated treatment well    Behavior During Therapy WFL for tasks assessed/performed             Past Medical History:  Diagnosis Date   Acid reflux    Allergy    Cancer ([place])    skin ca on leg head and back   Celiac disease    Current moderate episode of major depressive disorder without prior episode ([place])    Gastroesophageal reflux disease without esophagitis    Hyperlipidemia    Hypertension    Hypothyroidism    OP (osteoporosis)    Piriformis syndrome, left 02/12/2021   Pre-diabetes    Primary osteoarthritis of left hip    Type II diabetes mellitus with complication ([place])    Vertigo    no episodes in several years   Wears dentures    partial lower    Past Surgical History:  Procedure Laterality Date   ABDOMINAL HYSTERECTOMY  1984   for bleeding   Basil cell     BILATERAL SALPINGOOPHORECTOMY  1984   CATARACT EXTRACTION W/PHACO Right 11/24/2016   Procedure: CATARACT EXTRACTION PHACO AND INTRAOCULAR LENS PLACEMENT ([place]) Right;  Surgeon: Leandrew Koyanagi, MD;  Location: [place];  Service: Ophthalmology;  Laterality: Right;   CATARACT EXTRACTION W/PHACO Left 02/04/2017   Procedure: CATARACT EXTRACTION PHACO AND INTRAOCULAR LENS PLACEMENT ([place])  Left;  Surgeon: Leandrew Koyanagi, MD;  Location: [place];  Service: Ophthalmology;  Laterality: Left;   ORIF WRIST FRACTURE Left 2015   TYMPANOPLASTY  1992    There were no vitals filed for this visit.   Subjective Assessment - 05/09/21 1036     Subjective Patient reports that she has no pain at this time. She reports tolerating chores and household tasks well at this time. She reports doing well with transferring/sit to stand. Patient reports her main limitation at this time is stair negotiation - she states "it hurts," but does not feel like her initial concordant pain.    Pertinent History Pateint is an 86 year old female with primary complaint of left lower limb pain, left hip pain. Patient reports L lower limb pain down to her L foot with tingling down to her toes. She reports sometimes it feels like it starts in the hip and goes down - sometimes it seems to begin in her lower leg and moves up to hip. She reports one episode of numbness. Hx of Type II DM. Patient reports that she  developed PMR in May-June 2021; she states that this is where her issues began. PMR improved with use of prednisone. She is still tapering from prednisone to date. She states that as PMR has improved and then her L lower quarter pain started. She has children that live fairly close but are not readily available always. She states that her most recent condition began around beginning of August. She states that change in footwear didn't help. She denies night pain - sometimes she has difficulty getting to sleep initially. She reports pain with putting weight on LLE in AM. Hx of depression following her husband's death. No bowel/bladder changes. She states that sometimes she thinks bending may help her  feel better.    Limitations Walking;Standing;Sitting;House hold activities    How long can you walk comfortably? Minimal distance when pain is increased    Diagnostic tests Radiograph, see below    Patient Stated Goals Goals: Able to take care of herself without pain    Pain Onset More than a month ago               OBJECTIVE FINDINGS  AROM Lumbar flexion 75% Lumbar extension 100% Lateral flexion: R 100%, L 100% Thoracolumbar rotation: R 100%, L 100%   Strength R/L 4+/4 Hip flexion 4+/4+ Hip external rotation 4/5 Hip internal rotation 5/5 Hip abduction 5/5 Hip adduction 5/5 Knee extension 5/5 Knee flexion 5/5 Ankle Dorsiflexion *indicates pain    FUNCTIONAL TASKS  Sit to stand: WNL with no pain reproduced  Stair negotiation: Reciprocal pattern with unilateral R upper limb support, mild ipsilateral trunk lean and instability with loading response during descent        TREATMENT      NuStep x 5 minutes, Level 3; seat 6, arms 7 - unbilled time   Therapeutic Activities  Goal update performed (see objective findings above and updated goals below)       Neuromuscular Re-education - for nervous system downregulation, gluteal musculature activation and exercises to promote LE kinetic chain stability       Sidelying Hip Abduction; 1x10 [moderate tactile and verbal cueing to attain neutral hip position versus flexed hip in sidelying] Glute Bridge, supine; 2x10, partial range, with Green Tband above knees  Standing 3-way hip; x5 ea dir (standing in // bars)     *not today* Sidelying Clamshell (lying on R); x20, with Green resistance band Hip ABD and ADD isometrics; 2x10, 3 sec (with PT manual resistance in supine hooklying today)  Supine Pelvic tilt; 2x10 [emphasis on posterior tilt, moderate tactile and verbal cueing + demonstration]      Therapeutic Exercise - HEP establishment to improve carryover of in-clinic intervention, soft tissue  mobility/extensibility, dural/neural mobility, repeated movement for symptom modulation/decreasing peripheral symptoms    Walking trial in clinic; ambulate 2.5 laps in 70-ft hallway in clinic [no reproduction of pain]  Standing toe tap c 6-inch step; 2x10 alternating, standing on Airex pad         *not today* Sidestep in // bars; 4x D/B with Red Tband at ankles Sit to stand; 2x8, edge of table Minisquat with light touch bilat UE on // bars; 20x Repeated lumbar flexion in sitting; x10   Repeated flexion in lying (double knee to chest with towel); 1x10 Repeated flexion in lying, with clinician overpressure (double knee to chest with towel); 1x10, 1 sec hold at end-range Lower trunk rotations,supine; x10 ea dir  Supine piriformis stretch; x 1 min Seated sciatic glider; 2x10        *  not today* Cold pack (unbilled) utilized at end of sesion for analgesic effect and anti-inflammatory effect along L gluteal region, x5 minutes in R sidelying         ASSESSMENT Patient has met established physical therapy goals and subjectively feels she is approaching readiness for discharge. Patient voices primary concern with discomfort and unsteadiness with stair negotiation at this time as needed for accessing her home. She reports significant improvement in ability to perform weightbearing/prolonged standing activity in home and community with minimal pain presently. She has met performance-based goal for ambulation without reproduction of symptoms. She has mild instability with stair negotiation and imbalance; PT can focus on these deficits with remaining visits and we will update her HEP to address related impairments in stability in unipedal stance and motor control. Anticipate discharge within patient's currently established POC (next week).       PT Short Term Goals - 05/09/21 1044       PT SHORT TERM GOAL #1   Title Pt will be independent and 100% compliant with established HEP and activity  modification as needed to augment PT intervention and prevent flare-up of back pain as needed for best return to prior level of function.    Baseline 03/21/21: Baseline HEP initiated.    04/23/21: Compliant and independent with HEP, pt modifying volume of walking and aggravating positions/postures.  05/09/21: Compliant and IND    Time 3    Period Weeks    Status Achieved    Target Date 04/11/21               PT Long Term Goals - 05/09/21 1037       PT LONG TERM GOAL #1   Title Patient will demonstrate improved function as evidenced by a score of 68 on FOTO measure for full participation in activities at home and in the community.    Baseline 03/21/21: 55.   04/23/21: 55  05/09/21: 83    Time 8    Period Weeks    Status Achieved    Target Date 05/16/21      PT LONG TERM GOAL #2   Title Patient will have full thoracolumbar AROM without reproduction of pain as needed for functional reaching, self-care ADLs, bed mobility, household chores.    Baseline 03/21/21: Pain and motion loss with extension and R rotation, pain with L rotation.  04/23/21: Mild motion loss with extension and bilateral rotation, L gluteal pain with L rotation.  05/09/21: Good ROM without reproduction of pain, only mild lumbar forward flexion motion loss.    Time 8    Period Weeks    Status Achieved    Target Date 05/09/21      PT LONG TERM GOAL #3   Title Patient will have MMT for all tested musculature 4+/5 or greater without reproduction of pain indicative of improved capacity for loading paraspinal/pelvic and gluteal mm as needed for performance of transferring, household chores, prolonged weightbearing tasks    Baseline 03/21/21: Hip flexion 3+, Hip ABD 4, Quad 4-, Hamtrings 3+.   04/23/21: L hip flexion 4-, hip ABD 4+, Quad 4+, Hamsrings 4-.   05/09/21: Met for all but L hip flexion    Time 8    Period Weeks    Status Achieved    Target Date 05/16/21      PT LONG TERM GOAL #4   Title Patient will be able to  ambulate for home-mobility distance (150 ft or greater) with no increase in pain >  1-2/10 as needed for accessing her home    Baseline 03/21/21: Difficulty with walking home-mobility distance due to L lower limb pain.   04/23/21: Improved gait pattern, but reproduction of gluteal pain with walking 150 ft in clinic at 3/10 intensity.  05/09/21: Able to walk 160 feet without onset of pain    Time 8    Period Weeks    Status Achieved    Target Date 05/16/21                   Plan - 05/10/21 1257     Clinical Impression Statement Patient has met established physical therapy goals and subjectively feels she is approaching readiness for discharge. Patient voices primary concern with discomfort and unsteadiness with stair negotiation at this time as needed for accessing her home. She reports significant improvement in ability to perform weightbearing/prolonged standing activity in home and community with minimal pain presently. She has met performance-based goal for ambulation without reproduction of symptoms. She has mild instability with stair negotiation and imbalance; PT can focus on these deficits with remaining visits and we will update her HEP to address related impairments in stability in unipedal stance and motor control. Anticipate discharge within patient's currently established POC (next week).    Personal Factors and Comorbidities Age;Comorbidity 3+    Comorbidities Osteoporosis, Type II DM, depression, celiac disease, GERD, Hx of skin cancer    Examination-Activity Limitations Locomotion Level;Sit;Stand;Stairs;Transfers    Examination-Participation Restrictions Church;Community Activity    Stability/Clinical Decision Making Evolving/Moderate complexity    Rehab Potential Good    PT Frequency 2x / week    PT Duration 8 weeks    PT Treatment/Interventions Cryotherapy;Electrical Stimulation;Moist Heat;Therapeutic activities;Therapeutic exercise;Neuromuscular re-education;Manual  techniques;Dry needling    PT Next Visit Plan Manual therapy and neurodynamics to improve L buttock and LE referred symptoms, repeated movement prn, and continued progression of gluteal strengthening. Flexion bias repeated movement prn for symptom modulation as needed.    PT Home Exercise Plan Access Code [email or web address]    Consulted and Agree with Plan of Care Patient             Patient will benefit from skilled therapeutic intervention in order to improve the following deficits and impairments:  Abnormal gait, Decreased strength, Impaired sensation, Pain, Decreased range of motion, Impaired flexibility, Postural dysfunction  Visit Diagnosis: Pain in left hip  Left-sided low back pain with left-sided sciatica, unspecified chronicity  Difficulty in walking, not elsewhere classified  Muscle weakness (generalized)     Problem List Patient Active Problem List   Diagnosis Date Noted   Left lumbar radiculopathy 03/13/2021   Piriformis syndrome, left 02/12/2021   Aortic atherosclerosis ([place]) 08/01/2020   Disorder of bursae of shoulder region 02/02/2020   PMR (polymyalgia rheumatica) ([place]) 02/02/2020   Primary osteoarthritis of left hip 01/24/2020   Recurrent major depression in partial remission ([place]) 12/28/2019   Diffuse photodamage of skin 02/13/2016   Basal cell carcinoma 01/11/2016   Gastroesophageal reflux disease without esophagitis 07/03/2015   Type II diabetes mellitus with complication ([place]) 08/65/7846   Acquired hypothyroidism 10/31/2014   Allergic rhinitis 10/31/2014   Benign hypertension 10/31/2014   CD (celiac disease) 10/31/2014   Dyslipidemia 10/31/2014   OP (osteoporosis) 10/31/2014   Valentina Gu, PT, DPT #[id number]  Eilleen Kempf, PT 05/10/2021, 12:59 PM  [place] [address] [place], [place], [address] Phone: [phone]   Fax:  [phone]  Name: Claudette Laws MRN:  [id number] Date of  Birth: 1934/12/31

## 2021-05-10 ENCOUNTER — Encounter: Payer: Self-pay | Admitting: Physical Therapy

## 2021-05-14 ENCOUNTER — Other Ambulatory Visit: Payer: Self-pay

## 2021-05-14 ENCOUNTER — Ambulatory Visit: Payer: Medicare PPO | Admitting: Physical Therapy

## 2021-05-14 DIAGNOSIS — M5442 Lumbago with sciatica, left side: Secondary | ICD-10-CM

## 2021-05-14 DIAGNOSIS — M25552 Pain in left hip: Secondary | ICD-10-CM

## 2021-05-14 DIAGNOSIS — R262 Difficulty in walking, not elsewhere classified: Secondary | ICD-10-CM | POA: Diagnosis not present

## 2021-05-14 DIAGNOSIS — M6281 Muscle weakness (generalized): Secondary | ICD-10-CM

## 2021-05-14 NOTE — Therapy (Signed)
[facility] [address] [place], [place], [address] Phone: [phone]   Fax:  [phone]  Physical Therapy Treatment  Patient Details  Name: Jill Dunlap MRN: [id number] Date of Birth: [birth year] Referring Provider (PT): Rosette Reveal, MD   Encounter Date: 05/14/2021   PT End of Session - 05/14/21 1049     Visit Number 16    Number of Visits 17    Date for PT Re-Evaluation 05/16/21    Authorization Type VL: based on auth.    Authorization Time Period Auth 9/1-10/27; Cert 6/01-09/32; prog note 04/23/21    Authorization - Visit Number 16   corrected error in authorization visit count; initial evaluation was not included in initial authorization visit count   Authorization - Number of Visits 17    Progress Note Due on Visit 63    PT Start Time 1033    PT Stop Time 1115    PT Time Calculation (min) 42 min    Activity Tolerance Patient tolerated treatment well    Behavior During Therapy WFL for tasks assessed/performed             Past Medical History:  Diagnosis Date   Acid reflux    Allergy    Cancer ([place])    skin ca on leg head and back   Celiac disease    Current moderate episode of major depressive disorder without prior episode ([place])    Gastroesophageal reflux disease without esophagitis    Hyperlipidemia    Hypertension    Hypothyroidism    OP (osteoporosis)    Piriformis syndrome, left 02/12/2021   Pre-diabetes    Primary osteoarthritis of left hip    Type II diabetes mellitus with complication ([place])    Vertigo    no episodes in several years   Wears dentures    partial lower    Past Surgical History:  Procedure Laterality Date   ABDOMINAL HYSTERECTOMY  1984   for bleeding   Basil cell     BILATERAL SALPINGOOPHORECTOMY  1984   CATARACT EXTRACTION W/PHACO Right 11/24/2016   Procedure: CATARACT EXTRACTION PHACO AND INTRAOCULAR LENS PLACEMENT ([place]) Right;  Surgeon: Leandrew Koyanagi, MD;   Location: [place];  Service: Ophthalmology;  Laterality: Right;   CATARACT EXTRACTION W/PHACO Left 02/04/2017   Procedure: CATARACT EXTRACTION PHACO AND INTRAOCULAR LENS PLACEMENT ([place])  Left;  Surgeon: Leandrew Koyanagi, MD;  Location: [facility];  Service: Ophthalmology;  Laterality: Left;   ORIF WRIST FRACTURE Left 2015   TYMPANOPLASTY  1992    There were no vitals filed for this visit.   Subjective Assessment - 05/15/21 0908     Subjective Patient denies pain at arrival today. She reports having another funeral to attend over the weekend. She reports generalized "soreness" following prolonged standing/walking at this event and high volume of housework performed over the weekend, but no increase in pain.    Pertinent History Pateint is an 86 year old female with primary complaint of left lower limb pain, left hip pain. Patient reports L lower limb pain down to her L foot with tingling down to her toes. She reports sometimes it feels like it starts in the hip and goes down - sometimes it seems to begin in her lower leg and moves up to hip. She reports one episode of numbness. Hx of Type II DM. Patient reports that she developed PMR in May-June 2021; she states that this is where her issues began. PMR  improved with use of prednisone. She is still tapering from prednisone to date. She states that as PMR has improved and then her L lower quarter pain started. She has children that live fairly close but are not readily available always. She states that her most recent condition began around beginning of August. She states that change in footwear didn't help. She denies night pain - sometimes she has difficulty getting to sleep initially. She reports pain with putting weight on LLE in AM. Hx of depression following her husband's death. No bowel/bladder changes. She states that sometimes she thinks bending may help her feel better.    Limitations Walking;Standing;Sitting;House hold  activities    How long can you walk comfortably? Minimal distance when pain is increased    Diagnostic tests Radiograph, see below    Patient Stated Goals Goals: Able to take care of herself without pain    Currently in Pain? No/denies    Pain Onset More than a month ago               No tenderness to palpation along L gluteal/hip complex  Patient performs reciprocal pattern for stair ascent with bilateral upper limb support with no LOB or instability, step-to pattern for descent with bilateral upper limb support and no LOB or major movement deviation     TREATMENT     NuStep x 5 minutes, Level 3; seat 6, arms 7 - unbilled time      Neuromuscular Re-education - for nervous system downregulation, gluteal musculature activation and exercises to promote LE kinetic chain stability     Sidelying Hip Abduction; 2x10 Glute Bridge, supine; 2x10, partial range, with Green Tband above knees  Forward step up to toe tap on second step; x10 bilateral LE [heavy verbal cueing, demonstration for correct technique]  In // bars:  Standing 3-way hip; x8 ea dir (standing in // bars), with 2-lb ankle weights Single limb stance, 5 x5 sec seconds, bilaterally; alternating Forward step on and over 2 Airex pads, R foot leading one way, L foot leading on return trip; 5x D/B    *not today* Sidelying Clamshell (lying on R); x20, with Green resistance band Hip ABD and ADD isometrics; 2x10, 3 sec (with PT manual resistance in supine hooklying today)  Supine Pelvic tilt; 2x10 [emphasis on posterior tilt, moderate tactile and verbal cueing + demonstration]       Therapeutic Exercise - HEP establishment to improve carryover of in-clinic intervention, soft tissue mobility/extensibility, dural/neural mobility, repeated movement for symptom modulation/decreasing peripheral symptoms     Sidestep in // bars; 4x D/B with Red Tband at ankles  Sit to stand; 2x10, edge of table   Standing toe tap c  6-inch step; 2x10 alternating, standing on Airex pad        *not today* Walking trial in clinic; ambulate 2.5 laps in 70-ft hallway in clinic [no reproduction of pain] Minisquat with light touch bilat UE on // bars; 20x Repeated lumbar flexion in sitting; x10   Repeated flexion in lying (double knee to chest with towel); 1x10 Repeated flexion in lying, with clinician overpressure (double knee to chest with towel); 1x10, 1 sec hold at end-range Lower trunk rotations,supine; x10 ea dir  Supine piriformis stretch; x 1 min Seated sciatic glider; 2x10        *not today* Cold pack (unbilled) utilized at end of sesion for analgesic effect and anti-inflammatory effect along L gluteal region, x5 minutes in R sidelying  ASSESSMENT Patient is making excellent progress at this time and demonstrates safe stair negotiation, albeit modified self-selected technique for descent with step-to pattern. Patient has no significant gluteal or LE referred symptoms at this time and is progressing well with advanced strengthening and graded activity. Pt demonstrates safe stepping down over Airex pads in clinic today with intermittent UE support as needed on parallel bars. Anticipate discharge following patient's next PT visit.       PT Short Term Goals - 05/09/21 1044       PT SHORT TERM GOAL #1   Title Pt will be independent and 100% compliant with established HEP and activity modification as needed to augment PT intervention and prevent flare-up of back pain as needed for best return to prior level of function.    Baseline 03/21/21: Baseline HEP initiated.    04/23/21: Compliant and independent with HEP, pt modifying volume of walking and aggravating positions/postures.  05/09/21: Compliant and IND    Time 3    Period Weeks    Status Achieved    Target Date 04/11/21               PT Long Term Goals - 05/09/21 1037       PT LONG TERM GOAL #1   Title Patient will demonstrate improved  function as evidenced by a score of 68 on FOTO measure for full participation in activities at home and in the community.    Baseline 03/21/21: 55.   04/23/21: 55  05/09/21: 83    Time 8    Period Weeks    Status Achieved    Target Date 05/16/21      PT LONG TERM GOAL #2   Title Patient will have full thoracolumbar AROM without reproduction of pain as needed for functional reaching, self-care ADLs, bed mobility, household chores.    Baseline 03/21/21: Pain and motion loss with extension and R rotation, pain with L rotation.  04/23/21: Mild motion loss with extension and bilateral rotation, L gluteal pain with L rotation.  05/09/21: Good ROM without reproduction of pain, only mild lumbar forward flexion motion loss.    Time 8    Period Weeks    Status Achieved    Target Date 05/09/21      PT LONG TERM GOAL #3   Title Patient will have MMT for all tested musculature 4+/5 or greater without reproduction of pain indicative of improved capacity for loading paraspinal/pelvic and gluteal mm as needed for performance of transferring, household chores, prolonged weightbearing tasks    Baseline 03/21/21: Hip flexion 3+, Hip ABD 4, Quad 4-, Hamtrings 3+.   04/23/21: L hip flexion 4-, hip ABD 4+, Quad 4+, Hamsrings 4-.   05/09/21: Met for all but L hip flexion    Time 8    Period Weeks    Status Achieved    Target Date 05/16/21      PT LONG TERM GOAL #4   Title Patient will be able to ambulate for home-mobility distance (150 ft or greater) with no increase in pain > 1-2/10 as needed for accessing her home    Baseline 03/21/21: Difficulty with walking home-mobility distance due to L lower limb pain.   04/23/21: Improved gait pattern, but reproduction of gluteal pain with walking 150 ft in clinic at 3/10 intensity.  05/09/21: Able to walk 160 feet without onset of pain    Time 8    Period Weeks    Status Achieved    Target Date  05/16/21                   Plan - 05/15/21 0921     Clinical Impression  Statement Patient is making excellent progress at this time and demonstrates safe stair negotiation, albeit modified self-selected technique for descent with step-to pattern. Patient has no significant gluteal or LE referred symptoms at this time and is progressing well with advanced strengthening and graded activity. Pt demonstrates safe stepping down over Airex pads in clinic today with intermittent UE support as needed on parallel bars. Anticipate discharge following patient's next PT visit.    Personal Factors and Comorbidities Age;Comorbidity 3+    Comorbidities Osteoporosis, Type II DM, depression, celiac disease, GERD, Hx of skin cancer    Examination-Activity Limitations Locomotion Level;Sit;Stand;Stairs;Transfers    Examination-Participation Restrictions Church;Community Activity    Stability/Clinical Decision Making Evolving/Moderate complexity    Rehab Potential Good    PT Frequency 2x / week    PT Duration 8 weeks    PT Treatment/Interventions Cryotherapy;Electrical Stimulation;Moist Heat;Therapeutic activities;Therapeutic exercise;Neuromuscular re-education;Manual techniques;Dry needling    PT Next Visit Plan Manual therapy and neurodynamics to improve L buttock and LE referred symptoms, repeated movement prn, and continued progression of gluteal strengthening. Flexion bias repeated movement prn for symptom modulation as needed.    PT Home Exercise Plan Access Code [email or web address]    Consulted and Agree with Plan of Care Patient             Patient will benefit from skilled therapeutic intervention in order to improve the following deficits and impairments:  Abnormal gait, Decreased strength, Impaired sensation, Pain, Decreased range of motion, Impaired flexibility, Postural dysfunction  Visit Diagnosis: Pain in left hip  Left-sided low back pain with left-sided sciatica, unspecified chronicity  Difficulty in walking, not elsewhere classified  Muscle weakness  (generalized)     Problem List Patient Active Problem List   Diagnosis Date Noted   Left lumbar radiculopathy 03/13/2021   Piriformis syndrome, left 02/12/2021   Aortic atherosclerosis ([place]) 08/01/2020   Disorder of bursae of shoulder region 02/02/2020   PMR (polymyalgia rheumatica) ([place]) 02/02/2020   Primary osteoarthritis of left hip 01/24/2020   Recurrent major depression in partial remission ([place]) 12/28/2019   Diffuse photodamage of skin 02/13/2016   Basal cell carcinoma 01/11/2016   Gastroesophageal reflux disease without esophagitis 07/03/2015   Type II diabetes mellitus with complication ([place]) 68/09/2120   Acquired hypothyroidism 10/31/2014   Allergic rhinitis 10/31/2014   Benign hypertension 10/31/2014   CD (celiac disease) 10/31/2014   Dyslipidemia 10/31/2014   OP (osteoporosis) 10/31/2014   Valentina Gu, PT, DPT #[id number]  Eilleen Kempf, PT 05/15/2021, 9:22 AM  [facility] [address] [place], [place], [address] Phone: [phone]   Fax:  [phone]  Name: Arleen Bar MRN: [id number] Date of Birth: [birth year]

## 2021-05-15 ENCOUNTER — Encounter: Payer: Self-pay | Admitting: Physical Therapy

## 2021-05-16 ENCOUNTER — Other Ambulatory Visit: Payer: Self-pay

## 2021-05-16 ENCOUNTER — Ambulatory Visit: Payer: Medicare PPO | Attending: Internal Medicine

## 2021-05-16 ENCOUNTER — Ambulatory Visit: Payer: Medicare PPO | Admitting: Physical Therapy

## 2021-05-16 ENCOUNTER — Encounter: Payer: Self-pay | Admitting: Physical Therapy

## 2021-05-16 DIAGNOSIS — M5442 Lumbago with sciatica, left side: Secondary | ICD-10-CM | POA: Diagnosis not present

## 2021-05-16 DIAGNOSIS — M6281 Muscle weakness (generalized): Secondary | ICD-10-CM | POA: Diagnosis not present

## 2021-05-16 DIAGNOSIS — Z23 Encounter for immunization: Secondary | ICD-10-CM

## 2021-05-16 DIAGNOSIS — M25552 Pain in left hip: Secondary | ICD-10-CM

## 2021-05-16 DIAGNOSIS — R262 Difficulty in walking, not elsewhere classified: Secondary | ICD-10-CM | POA: Diagnosis not present

## 2021-05-16 MED ORDER — PFIZER COVID-19 VAC BIVALENT 30 MCG/0.3ML IM SUSP
INTRAMUSCULAR | 0 refills | Status: DC
  Filled 2021-05-16: qty 0.3, 1d supply, fill #0

## 2021-05-16 NOTE — Therapy (Signed)
[facility] [address]. [place], [place], [address] Phone: [phone]   Fax:  [phone]  Physical Therapy Treatment/Discharge Summary   Patient Details  Name: Jill Dunlap MRN: [id number] Date of Birth: 08-20-1934 Referring Provider (PT): Rosette Reveal, MD   Encounter Date: 05/16/2021   PT End of Session - 05/19/21 0708     Visit Number 17    Number of Visits 17    Date for PT Re-Evaluation 05/16/21    Authorization Type VL: based on auth.    Authorization Time Period Auth 9/1-10/27; Cert 3/55-73/22; prog note 04/23/21    Authorization - Visit Number 17   corrected error in authorization visit count; initial evaluation was not included in initial authorization visit count   Authorization - Number of Visits 17    Progress Note Due on Visit 23    PT Start Time 1035    PT Stop Time 1115    PT Time Calculation (min) 40 min    Activity Tolerance Patient tolerated treatment well    Behavior During Therapy WFL for tasks assessed/performed             Past Medical History:  Diagnosis Date   Acid reflux    Allergy    Cancer ([place])    skin ca on leg head and back   Celiac disease    Current moderate episode of major depressive disorder without prior episode ([place])    Gastroesophageal reflux disease without esophagitis    Hyperlipidemia    Hypertension    Hypothyroidism    OP (osteoporosis)    Piriformis syndrome, left 02/12/2021   Pre-diabetes    Primary osteoarthritis of left hip    Type II diabetes mellitus with complication ([place])    Vertigo    no episodes in several years   Wears dentures    partial lower    Past Surgical History:  Procedure Laterality Date   ABDOMINAL HYSTERECTOMY  1984   for bleeding   Basil cell     BILATERAL SALPINGOOPHORECTOMY  1984   CATARACT EXTRACTION W/PHACO Right 11/24/2016   Procedure: CATARACT EXTRACTION PHACO AND INTRAOCULAR LENS PLACEMENT ([place]) Right;  Surgeon:  Leandrew Koyanagi, MD;  Location: [place];  Service: Ophthalmology;  Laterality: Right;   CATARACT EXTRACTION W/PHACO Left 02/04/2017   Procedure: CATARACT EXTRACTION PHACO AND INTRAOCULAR LENS PLACEMENT ([place])  Left;  Surgeon: Leandrew Koyanagi, MD;  Location: [facility];  Service: Ophthalmology;  Laterality: Left;   ORIF WRIST FRACTURE Left 2015   TYMPANOPLASTY  1992    There were no vitals filed for this visit.   Subjective Assessment - 05/19/21 0708     Subjective Patient reports some comorbid L knee pain this AM. She reports performing a lot of walking yesterday. Patient denies significant mechanism of injury for L knee. She reports difficulty with getting comfortable the previous night. Patient reports no reproduction of recurrence of her primary issue/gluteal and LLE referred pain. Patient reports  she is grossly back to 100% SANE score at this time. Patient reports her main concern is negotiating steps at home that are deeper than most steps. She reports no issues with functional mobility or completing household chores.    Pertinent History Pateint is an 86 year old female with primary complaint of left lower limb pain, left hip pain. Patient reports L lower limb pain down to her L foot with tingling down to her toes. She reports sometimes it feels like it  starts in the hip and goes down - sometimes it seems to begin in her lower leg and moves up to hip. She reports one episode of numbness. Hx of Type II DM. Patient reports that she developed PMR in May-June 2021; she states that this is where her issues began. PMR improved with use of prednisone. She is still tapering from prednisone to date. She states that as PMR has improved and then her L lower quarter pain started. She has children that live fairly close but are not readily available always. She states that her most recent condition began around beginning of August. She states that change in footwear didn't help.  She denies night pain - sometimes she has difficulty getting to sleep initially. She reports pain with putting weight on LLE in AM. Hx of depression following her husband's death. No bowel/bladder changes. She states that sometimes she thinks bending may help her feel better.    Limitations Walking;Standing;Sitting;House hold activities    How long can you walk comfortably? Minimal distance when pain is increased    Diagnostic tests Radiograph, see below    Patient Stated Goals Goals: Able to take care of herself without pain    Pain Onset More than a month ago               OBJECTIVE FINDINGS    Observation No trophic changes, ecchymosis, erythema, or gross deformities around knee complex.   ROM Knee PROM WNL with no pain reproduced with overpressure  Strength R/L 4/4 Hip flexion 4/4 Hip external rotation  4/5 Hip abduction 5/5 Hip adduction 5/5 Knee extension 4/4+ Knee flexion 5/4+ Ankle Dorsiflexion *indicates pain  Palpation No tenderness to palpation along hip complex, gluteal musculature, or knee complex.        TREATMENT   Therapeutic Activities  Quick screen for L knee. Update for strength goal.   Patient education: current PT progress, continued HEP and HEP update and review, discharge education and discussion of MDT maintenance program    Neuromuscular Re-education - for nervous system downregulation, gluteal musculature activation and exercises to promote LE kinetic chain stability     Sidelying Hip Abduction; 2x10 Glute Bridge, supine; 2x10, partial range, with Green Tband above knees   In // bars:  Standing 3-way hip; x8 ea dir (standing in // bars), with 2-lb ankle weights Forward step on and over 2 Airex pads, R foot leading one way, L foot leading on return trip; 3x DB, repeated 4x D/B with unilateral carry RUE with 7-lb Dbell (simulating carrying grocery bag or package in one arm)       *not today* Forward step up to toe tap on second  step; x10 bilateral LE [heavy verbal cueing, demonstration for correct technique] Sidelying Clamshell (lying on R); x20, with Green resistance band Hip ABD and ADD isometrics; 2x10, 3 sec (with PT manual resistance in supine hooklying today)  Supine Pelvic tilt; 2x10 [emphasis on posterior tilt, moderate tactile and verbal cueing + demonstration]       Therapeutic Exercise -  soft tissue mobility/extensibility, dural/neural mobility, repeated movement for symptom modulation/decreasing peripheral symptoms; graded activity to reduce threat to loading of gluteal and lumbopelvic complex      Sidestep in // bars; 4x D/B with Red Tband at ankles   Sit to stand; 2x10, edge of table   Standing toe tap c 6-inch step; 2x10 alternating, standing on Airex pad        *not today* Walking trial in clinic; ambulate  2.5 laps in 70-ft hallway in clinic [no reproduction of pain] Minisquat with light touch bilat UE on // bars; 20x Repeated lumbar flexion in sitting; x10   Repeated flexion in lying (double knee to chest with towel); 1x10 Repeated flexion in lying, with clinician overpressure (double knee to chest with towel); 1x10, 1 sec hold at end-range Lower trunk rotations,supine; x10 ea dir  Supine piriformis stretch; x 1 min Seated sciatic glider; 2x10        *not today* Cold pack (unbilled) utilized at end of sesion for analgesic effect and anti-inflammatory effect along L gluteal region, x5 minutes in R sidelying         ASSESSMENT Patient's primary complaint is resolved at this time with patient tolerating community-level gait and completion of household tasks well. Patient had fleeting knee pain in the AM with atraumatic onset - no gross findings with quick screen today and this pain is not present during our time in the clinic. Full goal update was completed one week ago; completed LE strength testing to assess for remaining strength deficits, and she still exhibits remaining LE weakness  short of established long-term goal. She has met all other long-term goals at this time and has well surpassed established FOTO goal. She denies notable pain at this time. She exhibits safe negotiation of steps in clinic and is able to perform stepping down from uneven surface with and without unilateral briefcase carry (used 7-lb dumbbell today). Updated HEP to improve stability with brief unipedal stance and closed-chain LE strength as needed for improved stair negotiation. We also discussed safe technique for negotiating her steps to get in and out of her home. Given her current progress, patient is appropriate for continued home exercise program and discharge for formal PT.       PT Short Term Goals - 05/09/21 1044       PT SHORT TERM GOAL #1   Title Pt will be independent and 100% compliant with established HEP and activity modification as needed to augment PT intervention and prevent flare-up of back pain as needed for best return to prior level of function.    Baseline 03/21/21: Baseline HEP initiated.    04/23/21: Compliant and independent with HEP, pt modifying volume of walking and aggravating positions/postures.  05/09/21: Compliant and IND    Time 3    Period Weeks    Status Achieved    Target Date 04/11/21               PT Long Term Goals - 05/19/21 0718       PT LONG TERM GOAL #1   Title Patient will demonstrate improved function as evidenced by a score of 68 on FOTO measure for full participation in activities at home and in the community.    Baseline 03/21/21: 55.   04/23/21: 55  05/09/21: 83    Time 8    Period Weeks    Status Achieved    Target Date 05/16/21      PT LONG TERM GOAL #2   Title Patient will have full thoracolumbar AROM without reproduction of pain as needed for functional reaching, self-care ADLs, bed mobility, household chores.    Baseline 03/21/21: Pain and motion loss with extension and R rotation, pain with L rotation.  04/23/21: Mild motion loss with  extension and bilateral rotation, L gluteal pain with L rotation.  05/09/21: Good ROM without reproduction of pain, only mild lumbar forward flexion motion loss.    Time 8  Period Weeks    Status Achieved    Target Date 05/09/21      PT LONG TERM GOAL #3   Title Patient will have MMT for all tested musculature 4+/5 or greater without reproduction of pain indicative of improved capacity for loading paraspinal/pelvic and gluteal mm as needed for performance of transferring, household chores, prolonged weightbearing tasks    Baseline 03/21/21: Hip flexion 3+, Hip ABD 4, Quad 4-, Hamtrings 3+.   04/23/21: L hip flexion 4-, hip ABD 4+, Quad 4+, Hamsrings 4-.   05/09/21: Met for all but L hip flexion.  05/16/21: 4/5 for hip flexion bilat, hip ER bilat, hip ABD, and R hamstrings.    Time 8    Period Weeks    Status Partially Met    Target Date 05/16/21      PT LONG TERM GOAL #4   Title Patient will be able to ambulate for home-mobility distance (150 ft or greater) with no increase in pain > 1-2/10 as needed for accessing her home    Baseline 03/21/21: Difficulty with walking home-mobility distance due to L lower limb pain.   04/23/21: Improved gait pattern, but reproduction of gluteal pain with walking 150 ft in clinic at 3/10 intensity.  05/09/21: Able to walk 160 feet without onset of pain    Time 8    Period Weeks    Status Achieved    Target Date 05/16/21                   Plan - 05/19/21 0725     Clinical Impression Statement Patient's primary complaint is resolved at this time with patient tolerating community-level gait and completion of household tasks well. Patient had fleeting knee pain in the AM with atraumatic onset - no gross findings with quick screen today and this pain is not present during our time in the clinic. Full goal update was completed one week ago; completed LE strength testing to assess for remaining strength deficits, and she still exhibits remaining LE weakness  short of established long-term goal. She has met all other long-term goals at this time and has well surpassed established FOTO goal. She denies notable pain at this time. She exhibits safe negotiation of steps in clinic and is able to perform stepping down from uneven surface with and without unilateral briefcase carry (used 7-lb dumbbell today). Updated HEP to improve stability with brief unipedal stance and closed-chain LE strength as needed for improved stair negotiation. We also discussed safe technique for negotiating her steps to get in and out of her home. Given her current progress, patient is appropriate for continued home exercise program and discharge for formal PT.    Personal Factors and Comorbidities Age;Comorbidity 3+    Comorbidities Osteoporosis, Type II DM, depression, celiac disease, GERD, Hx of skin cancer    Examination-Activity Limitations Locomotion Level;Sit;Stand;Stairs;Transfers    Examination-Participation Restrictions Church;Community Activity    Stability/Clinical Decision Making Evolving/Moderate complexity    Rehab Potential Good    PT Frequency 2x / week    PT Duration 8 weeks    PT Treatment/Interventions Cryotherapy;Electrical Stimulation;Moist Heat;Therapeutic activities;Therapeutic exercise;Neuromuscular re-education;Manual techniques;Dry needling    PT Next Visit Plan Discharge, continued HEP, continued MDT program with maintenance phase parameters    PT Home Exercise Plan Access Code [email or web address]    Consulted and Agree with Plan of Care Patient             Patient will benefit from skilled therapeutic intervention in order  to improve the following deficits and impairments:  Abnormal gait, Decreased strength, Impaired sensation, Pain, Decreased range of motion, Impaired flexibility, Postural dysfunction  Visit Diagnosis: Pain in left hip  Left-sided low back pain with left-sided sciatica, unspecified chronicity  Difficulty in walking, not elsewhere  classified  Muscle weakness (generalized)     Problem List Patient Active Problem List   Diagnosis Date Noted   Left lumbar radiculopathy 03/13/2021   Piriformis syndrome, left 02/12/2021   Aortic atherosclerosis ([place]) 08/01/2020   Disorder of bursae of shoulder region 02/02/2020   PMR (polymyalgia rheumatica) ([place]) 02/02/2020   Primary osteoarthritis of left hip 01/24/2020   Recurrent major depression in partial remission ([place]) 12/28/2019   Diffuse photodamage of skin 02/13/2016   Basal cell carcinoma 01/11/2016   Gastroesophageal reflux disease without esophagitis 07/03/2015   Type II diabetes mellitus with complication ([place]) 79/98/7215   Acquired hypothyroidism 10/31/2014   Allergic rhinitis 10/31/2014   Benign hypertension 10/31/2014   CD (celiac disease) 10/31/2014   Dyslipidemia 10/31/2014   OP (osteoporosis) 10/31/2014   Valentina Gu, PT, DPT #[id number]  Eilleen Kempf, PT 05/19/2021, 7:25 AM  [facility] [address] [place], [place], [address] Phone: [phone]   Fax:  [phone]  Name: Jill Dunlap MRN: [id number] Date of Birth: [birth year]

## 2021-05-16 NOTE — Progress Notes (Signed)
   Covid-19 Vaccination Clinic  Name:  Jill Dunlap    MRN: [id number] DOB: April 30, 1935  05/16/2021  [name] was observed post Covid-19 immunization for 15 minutes without incident. She was provided with Vaccine Information Sheet and instruction to access the V-Safe system.   [name] was instructed to call 911 with any severe reactions post vaccine: Difficulty breathing  Swelling of face and throat  A fast heartbeat  A bad rash all over body  Dizziness and weakness   Immunizations Administered     Name Date Dose VIS Date Route   Pfizer Covid-19 Vaccine Bivalent Booster 05/16/2021  2:32 PM 0.3 mL 03/20/2021 Intramuscular   Manufacturer: [place]   Lot: [id number]   [place]: [phone]

## 2021-05-30 ENCOUNTER — Other Ambulatory Visit: Payer: Self-pay | Admitting: Internal Medicine

## 2021-05-30 NOTE — Telephone Encounter (Signed)
Refill if appropriate.  Please advise.  

## 2021-05-30 NOTE — Telephone Encounter (Signed)
Requested medications are due for refill today yes  Requested medications are on the active medication list yes  Last refill 05/13/21  Last visit 03/27/21  Future visit scheduled 07/29/20  Notes to clinic Historical Provider, please assess.  Requested Prescriptions  Pending Prescriptions Disp Refills   lisinopril-hydrochlorothiazide (ZESTORETIC) 20-12.5 MG tablet [Pharmacy Med Name: LISINOPRIL-HCTZ 20-12.5 MG TAB] 30 tablet     Sig: TAKE (1) TABLET BY MOUTH EVERY DAY     Cardiovascular:  ACEI + Diuretic Combos Failed - 05/30/2021  2:54 PM      Failed - Na in normal range and within 180 days    Sodium  Date Value Ref Range Status  11/22/2020 138 134 - 144 mmol/L Final          Failed - K in normal range and within 180 days    Potassium  Date Value Ref Range Status  11/22/2020 4.0 3.5 - 5.2 mmol/L Final          Failed - Cr in normal range and within 180 days    Creatinine, Ser  Date Value Ref Range Status  11/22/2020 0.95 0.57 - 1.00 mg/dL Final          Failed - Ca in normal range and within 180 days    Calcium  Date Value Ref Range Status  11/22/2020 9.7 8.7 - 10.3 mg/dL Final          Passed - Patient is not pregnant      Passed - Last BP in normal range    BP Readings from Last 1 Encounters:  04/15/21 132/78          Passed - Valid encounter within last 6 months    Recent Outpatient Visits           2 months ago Benign hypertension   [facility] Glean Hess, MD   2 months ago Left lumbar radiculopathy   [facility] [name], MD   3 months ago Piriformis syndrome, left   [facility] [name], MD   4 months ago Acute non-recurrent sinusitis, unspecified location   [facility] Glean Hess, MD   6 months ago Annual physical exam   [facility] Glean Hess, MD       Future Appointments             In 2 months Army Melia Jesse Sans, MD [facility], [facility]

## 2021-05-30 NOTE — Telephone Encounter (Signed)
Requested Prescriptions  Pending Prescriptions Disp Refills  . levothyroxine (SYNTHROID) 50 MCG tablet [Pharmacy Med Name: LEVOTHYROXINE SODIUM 50 MCG TAB] 90 tablet 0    Sig: TAKE (1) TABLET BY MOUTH DAILY BEFORE BREAKFAST.     Endocrinology:  Hypothyroid Agents Failed - 05/30/2021 10:04 AM      Failed - TSH needs to be rechecked within 3 months after an abnormal result. Refill until TSH is due.      Passed - TSH in normal range and within 360 days    TSH  Date Value Ref Range Status  11/22/2020 2.180 0.450 - 4.500 uIU/mL Final         Passed - Valid encounter within last 12 months    Recent Outpatient Visits          2 months ago Benign hypertension   [place], MD   2 months ago Left lumbar radiculopathy   [facility] [name], MD   3 months ago Piriformis syndrome, left   [facility] [name], MD   4 months ago Acute non-recurrent sinusitis, unspecified location   [facility] Glean Hess, MD   6 months ago Annual physical exam   [facility] Glean Hess, MD      Future Appointments            In 2 months Army Melia Jesse Sans, MD [facility], [place]           . predniSONE (DELTASONE) 1 MG tablet [Pharmacy Med Name: PREDNISONE 1 MG TAB] 360 tablet     Sig: TAKE 4 TABLETS BY MOUTH DAILY WITH BREAKFAST     Not Delegated - Endocrinology:  Oral Corticosteroids Failed - 05/30/2021 10:04 AM      Failed - This refill cannot be delegated      Passed - Last BP in normal range    BP Readings from Last 1 Encounters:  04/15/21 132/78         Passed - Valid encounter within last 6 months    Recent Outpatient Visits          2 months ago Benign hypertension   [place], MD   2 months ago Left lumbar radiculopathy   [facility] [name], MD   3 months ago Piriformis syndrome, left   [facility] [facility] [name],  MD   4 months ago Acute non-recurrent sinusitis, unspecified location   [facility] Glean Hess, MD   6 months ago Annual physical exam   [facility] Glean Hess, MD      Future Appointments            In 2 months Army Melia Jesse Sans, MD [facility], [facility]

## 2021-05-30 NOTE — Telephone Encounter (Signed)
Requested medication (s) are due for refill today: Yes  Requested medication (s) are on the active medication list: Yes  Last refill:  11/22/20  Future visit scheduled: Yes  Notes to clinic:  See request.    Requested Prescriptions  Pending Prescriptions Disp Refills   predniSONE (DELTASONE) 1 MG tablet [Pharmacy Med Name: PREDNISONE 1 MG TAB] 360 tablet     Sig: TAKE 4 TABLETS BY [place]     Not Delegated - Endocrinology:  Oral Corticosteroids Failed - 05/30/2021 10:04 AM      Failed - This refill cannot be delegated      Passed - Last BP in normal range    BP Readings from Last 1 Encounters:  04/15/21 132/78          Passed - Valid encounter within last 6 months    Recent Outpatient Visits           2 months ago Benign hypertension   [facility] Glean Hess, MD   2 months ago Left lumbar radiculopathy   [facility] [name], MD   3 months ago Piriformis syndrome, left   [facility] [name], MD   4 months ago Acute non-recurrent sinusitis, unspecified location   [facility] Glean Hess, MD   6 months ago Annual physical exam   [facility] Glean Hess, MD       Future Appointments             In 2 months Army Melia Jesse Sans, MD [facility], [facility]            Signed Prescriptions Disp Refills   levothyroxine (SYNTHROID) 50 MCG tablet 90 tablet 0    Sig: TAKE (1) TABLET BY MOUTH DAILY BEFORE BREAKFAST.     Endocrinology:  Hypothyroid Agents Failed - 05/30/2021 10:04 AM      Failed - TSH needs to be rechecked within 3 months after an abnormal result. Refill until TSH is due.      Passed - TSH in normal range and within 360 days    TSH  Date Value Ref Range Status  11/22/2020 2.180 0.450 - 4.500 uIU/mL Final          Passed - Valid encounter within last 12 months    Recent Outpatient Visits           2 months ago Benign hypertension    [place], MD   2 months ago Left lumbar radiculopathy   [facility] [name], MD   3 months ago Piriformis syndrome, left   [facility] [name], MD   4 months ago Acute non-recurrent sinusitis, unspecified location   [facility] Glean Hess, MD   6 months ago Annual physical exam   [facility] Glean Hess, MD       Future Appointments             In 2 months Army Melia Jesse Sans, MD [facility], [facility]

## 2021-05-31 MED ORDER — LISINOPRIL-HYDROCHLOROTHIAZIDE 20-12.5 MG PO TABS: 1.0000 | ORAL_TABLET | Freq: Every day | ORAL | 1 refills | Status: DC

## 2021-06-04 ENCOUNTER — Ambulatory Visit: Payer: Medicare PPO | Admitting: Internal Medicine

## 2021-06-04 ENCOUNTER — Encounter: Payer: Self-pay | Admitting: Internal Medicine

## 2021-06-04 VITALS — BP 130/70 | HR 66 | Ht 65.0 in | Wt 174.4 lb

## 2021-06-04 DIAGNOSIS — M353 Polymyalgia rheumatica: Secondary | ICD-10-CM

## 2021-06-04 DIAGNOSIS — D1801 Hemangioma of skin and subcutaneous tissue: Secondary | ICD-10-CM | POA: Diagnosis not present

## 2021-06-04 DIAGNOSIS — M25511 Pain in right shoulder: Secondary | ICD-10-CM | POA: Diagnosis not present

## 2021-06-04 DIAGNOSIS — Z85828 Personal history of other malignant neoplasm of skin: Secondary | ICD-10-CM | POA: Diagnosis not present

## 2021-06-04 DIAGNOSIS — L720 Epidermal cyst: Secondary | ICD-10-CM | POA: Diagnosis not present

## 2021-06-04 DIAGNOSIS — M25512 Pain in left shoulder: Secondary | ICD-10-CM | POA: Diagnosis not present

## 2021-06-04 NOTE — Patient Instructions (Signed)
Go back to 5 mg prednisone daily until we decide about the next treatment.

## 2021-06-04 NOTE — Progress Notes (Signed)
Date:  06/04/2021   Name:  Jill Dunlap   DOB:  [birth year]   MRN:  [id number]   Chief Complaint: Shoulder Pain and Leg Pain  Shoulder Pain  The pain is present in the left shoulder and right shoulder. This is a new problem. The current episode started 1 to 4 weeks ago. There has been no history of extremity trauma. The quality of the pain is described as aching and dull. Associated symptoms include a limited range of motion (worst when reaching behind her back or reaching above her head). Pertinent negatives include no fever.  Leg Pain  The incident occurred more than 1 week ago. There was no injury mechanism. The pain is present in the right thigh and left thigh. The quality of the pain is described as aching. The pain is mild. The pain has been Intermittent since onset. Associated symptoms include muscle weakness.   Lab Results  Component Value Date   CREATININE 0.95 11/22/2020   BUN 14 11/22/2020   NA 138 11/22/2020   K 4.0 11/22/2020   CL 100 11/22/2020   CO2 22 11/22/2020   Lab Results  Component Value Date   CHOL 175 11/22/2020   HDL 75 11/22/2020   LDLCALC 84 11/22/2020   TRIG 89 11/22/2020   CHOLHDL 2.3 11/22/2020   Lab Results  Component Value Date   TSH 2.180 11/22/2020   Lab Results  Component Value Date   HGBA1C 7.3 (A) 03/27/2021   Lab Results  Component Value Date   WBC 8.3 11/22/2020   HGB 13.8 11/22/2020   HCT 41.3 11/22/2020   MCV 89 11/22/2020   PLT 217 11/22/2020   Lab Results  Component Value Date   ALT 15 11/22/2020   AST 17 11/22/2020   ALKPHOS 32 (L) 11/22/2020   BILITOT 0.5 11/22/2020     Review of Systems  Constitutional:  Negative for chills, fatigue and fever.  Respiratory:  Negative for chest tightness and shortness of breath.   Cardiovascular:  Negative for chest pain.  Musculoskeletal:  Positive for arthralgias (both shoulders and thighs).  Neurological:  Negative for dizziness, light-headedness and headaches.    Patient Active Problem List   Diagnosis Date Noted   Left lumbar radiculopathy 03/13/2021   Piriformis syndrome, left 02/12/2021   Aortic atherosclerosis ([place]) 08/01/2020   Disorder of bursae of shoulder region 02/02/2020   PMR (polymyalgia rheumatica) ([place]) 02/02/2020   Primary osteoarthritis of left hip 01/24/2020   Recurrent major depression in partial remission ([place]) 12/28/2019   Diffuse photodamage of skin 02/13/2016   Basal cell carcinoma 01/11/2016   Gastroesophageal reflux disease without esophagitis 07/03/2015   Type II diabetes mellitus with complication ([place]) 63/87/5643   Acquired hypothyroidism 10/31/2014   Allergic rhinitis 10/31/2014   Benign hypertension 10/31/2014   CD (celiac disease) 10/31/2014   Dyslipidemia 10/31/2014   OP (osteoporosis) 10/31/2014    Allergies  Allergen Reactions   Ciprofloxacin Hcl Rash    Past Surgical History:  Procedure Laterality Date   ABDOMINAL HYSTERECTOMY  1984   for bleeding   Basil cell     BILATERAL SALPINGOOPHORECTOMY  1984   CATARACT EXTRACTION W/PHACO Right 11/24/2016   Procedure: CATARACT EXTRACTION PHACO AND INTRAOCULAR LENS PLACEMENT ([place]) Right;  Surgeon: Leandrew Koyanagi, MD;  Location: [place];  Service: Ophthalmology;  Laterality: Right;   CATARACT EXTRACTION W/PHACO Left 02/04/2017   Procedure: CATARACT EXTRACTION PHACO AND INTRAOCULAR LENS PLACEMENT ([place])  Left;  Surgeon: Leandrew Koyanagi, MD;  Location: [place];  Service: Ophthalmology;  Laterality: Left;   ORIF WRIST FRACTURE Left 2015   TYMPANOPLASTY  1992    Social History   Tobacco Use   Smoking status: Never   Smokeless tobacco: Never  Vaping Use   Vaping Use: Never used  Substance Use Topics   Alcohol use: Never   Drug use: Never     Medication list has been reviewed and updated.  Current Meds  Medication Sig   azelastine (ASTELIN) 0.1 % nasal spray INSTILL 2 SPRAYS IN EACH NOSTRIL TWICE DAILY AS DIRECTED    Biotin 2500 MCG CAPS Take 1 capsule by mouth daily.   calcium-vitamin D (OSCAL WITH D) 500-200 MG-UNIT tablet Take 1 tablet by mouth 2 (two) times daily.   Cranberry 500 MG CAPS Take 1 capsule by mouth daily.    escitalopram (LEXAPRO) 10 MG tablet TAKE ONE (1) TABLET BY MOUTH ONCE DAILY   fexofenadine (ALLEGRA) 180 MG tablet Take 1 tablet by mouth daily.   fluticasone (FLONASE) 50 MCG/ACT nasal spray PLACE 2 SPRAYS INTO BOTH NOSTRILS DAILY.   Glucosamine-Chondroitin (MOVE FREE PO) Take 1 capsule by mouth in the morning and at bedtime.   levothyroxine (SYNTHROID) 50 MCG tablet TAKE (1) TABLET BY MOUTH DAILY BEFORE BREAKFAST.   Multiple Vitamins-Minerals (CENTRUM SILVER PO) Take 1 tablet by mouth daily.   omeprazole (PRILOSEC) 20 MG capsule TAKE ONE (1) CAPSULE EACH DAY.   simvastatin (ZOCOR) 20 MG tablet TAKE ONE TABLET BY MOUTH DAILY AT 6PM.   TURMERIC PO Take 1 tablet by mouth daily.     PHQ 2/9 Scores 06/04/2021 04/15/2021 03/27/2021 03/13/2021  PHQ - 2 Score 0 0 0 0  PHQ- 9 Score 0 0 0 0    GAD 7 : Generalized Anxiety Score 06/04/2021 03/27/2021 03/13/2021 02/12/2021  Nervous, Anxious, on Edge 0 0 0 0  Control/stop worrying 0 0 0 0  Worry too much - different things 0 0 0 0  Trouble relaxing 0 0 0 0  Restless 0 0 0 0  Easily annoyed or irritable 0 0 0 0  Afraid - awful might happen 0 0 0 0  Total GAD 7 Score 0 0 0 0  Anxiety Difficulty Not difficult at all - Not difficult at all Not difficult at all    BP Readings from Last 3 Encounters:  06/04/21 130/70  04/15/21 132/78  03/27/21 138/74    Physical Exam Constitutional:      Appearance: Normal appearance.  Cardiovascular:     Rate and Rhythm: Normal rate and regular rhythm.  Pulmonary:     Effort: Pulmonary effort is normal.     Breath sounds: No wheezing or rhonchi.  Musculoskeletal:     Right shoulder: Tenderness present. No swelling or crepitus. Decreased range of motion.     Left shoulder: Tenderness present. No swelling  or crepitus. Decreased range of motion.     Right upper leg: Tenderness present.     Left upper leg: Tenderness present.  Neurological:     Mental Status: She is alert.     Motor: Motor function is intact.     Gait: Gait is intact.     Comments: Able to stand from sitting without using her arms    Wt Readings from Last 3 Encounters:  06/04/21 174 lb 6.4 oz (79.1 kg)  04/15/21 176 lb (79.8 kg)  03/27/21 174 lb (78.9 kg)    BP 130/70   Pulse 66   Ht 5' 5" (  1.651 m)   Wt 174 lb 6.4 oz (79.1 kg)   SpO2 98%   BMI 29.02 kg/m   Assessment and Plan: 1. PMR (polymyalgia rheumatica) (HCC) Increase prednisone to 5 mg per day (up from 3 mg) for suspected mild relapse Obtain ESR then advise on dosing - Sedimentation rate  2. Bilateral shoulder pain, unspecified chronicity Hx of suspected rotator cuff tendonitis injected by [facility] last year If no response to increased dose of prednisone, will refer again   Partially dictated using Editor, commissioning. Any errors are unintentional.  Halina Maidens, MD [facility]  06/04/2021

## 2021-06-05 LAB — SEDIMENTATION RATE: Sed Rate: 20 mm/hr (ref 0–40)

## 2021-06-25 ENCOUNTER — Encounter: Payer: Self-pay | Admitting: Internal Medicine

## 2021-07-29 ENCOUNTER — Other Ambulatory Visit
Admission: RE | Admit: 2021-07-29 | Discharge: 2021-07-29 | Disposition: A | Discharge status: Home / Self Care | Payer: Medicare PPO | Source: Home / Self Care | Attending: Internal Medicine | Admitting: Internal Medicine

## 2021-07-29 ENCOUNTER — Other Ambulatory Visit: Payer: Self-pay

## 2021-07-29 ENCOUNTER — Ambulatory Visit
Admission: RE | Admit: 2021-07-29 | Discharge: 2021-07-29 | Disposition: A | Discharge status: Home / Self Care | Payer: Medicare PPO | Attending: Internal Medicine | Admitting: Internal Medicine

## 2021-07-29 ENCOUNTER — Ambulatory Visit: Payer: Medicare PPO | Admitting: Internal Medicine

## 2021-07-29 ENCOUNTER — Ambulatory Visit
Admission: RE | Admit: 2021-07-29 | Discharge: 2021-07-29 | Disposition: A | Discharge status: Home / Self Care | Payer: Medicare PPO | Source: Ambulatory Visit | Attending: Internal Medicine | Admitting: Internal Medicine

## 2021-07-29 ENCOUNTER — Encounter: Payer: Self-pay | Admitting: Internal Medicine

## 2021-07-29 VITALS — BP 128/64 | HR 58 | Ht 65.0 in | Wt 167.0 lb

## 2021-07-29 DIAGNOSIS — M25512 Pain in left shoulder: Secondary | ICD-10-CM

## 2021-07-29 DIAGNOSIS — E039 Hypothyroidism, unspecified: Secondary | ICD-10-CM | POA: Insufficient documentation

## 2021-07-29 DIAGNOSIS — E118 Type 2 diabetes mellitus with unspecified complications: Secondary | ICD-10-CM | POA: Diagnosis not present

## 2021-07-29 DIAGNOSIS — K219 Gastro-esophageal reflux disease without esophagitis: Secondary | ICD-10-CM

## 2021-07-29 DIAGNOSIS — I10 Essential (primary) hypertension: Secondary | ICD-10-CM | POA: Diagnosis not present

## 2021-07-29 DIAGNOSIS — J8410 Pulmonary fibrosis, unspecified: Secondary | ICD-10-CM | POA: Diagnosis not present

## 2021-07-29 DIAGNOSIS — M353 Polymyalgia rheumatica: Secondary | ICD-10-CM | POA: Diagnosis not present

## 2021-07-29 DIAGNOSIS — F321 Major depressive disorder, single episode, moderate: Secondary | ICD-10-CM

## 2021-07-29 LAB — HEMOGLOBIN A1C
Hgb A1c MFr Bld: 6.5 % — ABNORMAL HIGH (ref 4.8–5.6)
Mean Plasma Glucose: 139.85 mg/dL

## 2021-07-29 LAB — SEDIMENTATION RATE: Sed Rate: 47 mm/h — ABNORMAL HIGH (ref 0–30)

## 2021-07-29 MED ORDER — ESCITALOPRAM OXALATE 10 MG PO TABS: ORAL_TABLET | ORAL | 1 refills | Status: DC

## 2021-07-29 MED ORDER — LISINOPRIL-HYDROCHLOROTHIAZIDE 20-12.5 MG PO TABS: ORAL_TABLET | ORAL | 1 refills | Status: DC

## 2021-07-29 MED ORDER — OMEPRAZOLE 20 MG PO CPDR: DELAYED_RELEASE_CAPSULE | ORAL | 1 refills | Status: DC

## 2021-07-29 MED ORDER — PREDNISONE 5 MG PO TABS: 5.0000 mg | ORAL_TABLET | Freq: Every day | ORAL | 1 refills | Status: DC

## 2021-07-29 MED ORDER — LEVOTHYROXINE SODIUM 50 MCG PO TABS: ORAL_TABLET | ORAL | 1 refills | Status: DC

## 2021-07-29 NOTE — Progress Notes (Signed)
Date:  07/29/2021   Name:  Jill Dunlap   DOB:  05-24-1935   MRN:  [id number]   Chief Complaint: Diabetes and Hypertension  Diabetes She presents for her follow-up diabetic visit. She has type 2 diabetes mellitus. Her disease course has been stable. Pertinent negatives for hypoglycemia include no dizziness, headaches or nervousness/anxiousness. Pertinent negatives for diabetes include no chest pain, no fatigue and no weakness. Current diabetic treatment includes diet. She is compliant with treatment most of the time. Her weight is stable. An ACE inhibitor/angiotensin II receptor blocker is not being taken.  Thyroid Problem Presents for follow-up visit. Patient reports no anxiety, constipation, diarrhea, fatigue or palpitations. The symptoms have been stable.  Hypertension This is a chronic problem. The problem is controlled. Pertinent negatives include no chest pain, headaches, palpitations or shortness of breath. Past treatments include ACE inhibitors and diuretics. The current treatment provides significant improvement. Identifiable causes of hypertension include a thyroid problem.  Shoulder Pain  The pain is present in the left shoulder. This is a recurrent problem. The problem has been gradually worsening. The quality of the pain is described as aching, burning and sharp. The pain is at a severity of 7/10. The pain is moderate. Associated symptoms include a limited range of motion. The symptoms are aggravated by activity. She has tried OTC ointments and acetaminophen for the symptoms. The treatment provided mild relief.  PMR - has stayed on 5 mg due to some ongoing muscle soreness.  She also has the shoulder pain on the left that has recurred.  However, she is not as hungry as before on the higher doses and has lost a few pounds.  Lab Results  Component Value Date   NA 138 11/22/2020   K 4.0 11/22/2020   CO2 22 11/22/2020   GLUCOSE 118 (H) 11/22/2020   BUN 14 11/22/2020    CREATININE 0.95 11/22/2020   CALCIUM 9.7 11/22/2020   EGFR 59 (L) 11/22/2020   GFRNONAA 52 (L) 05/09/2020   Lab Results  Component Value Date   CHOL 175 11/22/2020   HDL 75 11/22/2020   LDLCALC 84 11/22/2020   TRIG 89 11/22/2020   CHOLHDL 2.3 11/22/2020   Lab Results  Component Value Date   TSH 2.180 11/22/2020   Lab Results  Component Value Date   HGBA1C 7.3 (A) 03/27/2021   Lab Results  Component Value Date   WBC 8.3 11/22/2020   HGB 13.8 11/22/2020   HCT 41.3 11/22/2020   MCV 89 11/22/2020   PLT 217 11/22/2020   Lab Results  Component Value Date   ALT 15 11/22/2020   AST 17 11/22/2020   ALKPHOS 32 (L) 11/22/2020   BILITOT 0.5 11/22/2020   No results found for: 25OHVITD2, 25OHVITD3, VD25OH   Review of Systems  Constitutional:  Negative for chills, fatigue and unexpected weight change.  HENT:  Negative for nosebleeds.   Eyes:  Negative for visual disturbance.  Respiratory:  Negative for cough, chest tightness, shortness of breath and wheezing.   Cardiovascular:  Negative for chest pain, palpitations and leg swelling.  Gastrointestinal:  Negative for abdominal pain, constipation and diarrhea.  Musculoskeletal:  Positive for arthralgias and myalgias. Negative for gait problem and joint swelling.  Neurological:  Negative for dizziness, weakness, light-headedness and headaches.  Psychiatric/Behavioral:  Negative for dysphoric mood and sleep disturbance. The patient is not nervous/anxious.    Patient Active Problem List   Diagnosis Date Noted   Left lumbar radiculopathy 03/13/2021  Piriformis syndrome, left 02/12/2021   Aortic atherosclerosis ([place]) 08/01/2020   Disorder of bursae of shoulder region 02/02/2020   PMR (polymyalgia rheumatica) ([place]) 02/02/2020   Primary osteoarthritis of left hip 01/24/2020   Recurrent major depression in partial remission ([place]) 12/28/2019   Diffuse photodamage of skin 02/13/2016   Basal cell carcinoma 01/11/2016    Gastroesophageal reflux disease without esophagitis 07/03/2015   Type II diabetes mellitus with complication ([place]) 85/63/1497   Acquired hypothyroidism 10/31/2014   Allergic rhinitis 10/31/2014   Benign hypertension 10/31/2014   CD (celiac disease) 10/31/2014   Dyslipidemia 10/31/2014   OP (osteoporosis) 10/31/2014    Allergies  Allergen Reactions   Ciprofloxacin Hcl Rash    Past Surgical History:  Procedure Laterality Date   ABDOMINAL HYSTERECTOMY  1984   for bleeding   Basil cell     BILATERAL SALPINGOOPHORECTOMY  1984   CATARACT EXTRACTION W/PHACO Right 11/24/2016   Procedure: CATARACT EXTRACTION PHACO AND INTRAOCULAR LENS PLACEMENT ([place]) Right;  Surgeon: Leandrew Koyanagi, MD;  Location: [place];  Service: Ophthalmology;  Laterality: Right;   CATARACT EXTRACTION W/PHACO Left 02/04/2017   Procedure: CATARACT EXTRACTION PHACO AND INTRAOCULAR LENS PLACEMENT ([place])  Left;  Surgeon: Leandrew Koyanagi, MD;  Location: [place];  Service: Ophthalmology;  Laterality: Left;   ORIF WRIST FRACTURE Left 2015   TYMPANOPLASTY  1992    Social History   Tobacco Use   Smoking status: Never   Smokeless tobacco: Never  Vaping Use   Vaping Use: Never used  Substance Use Topics   Alcohol use: Never   Drug use: Never     Medication list has been reviewed and updated.  Current Meds  Medication Sig   azelastine (ASTELIN) 0.1 % nasal spray INSTILL 2 SPRAYS IN EACH NOSTRIL TWICE DAILY AS DIRECTED   Biotin 2500 MCG CAPS Take 1 capsule by mouth daily.   calcium-vitamin D (OSCAL WITH D) 500-200 MG-UNIT tablet Take 1 tablet by mouth 2 (two) times daily.   Cranberry 500 MG CAPS Take 1 capsule by mouth daily.    fexofenadine (ALLEGRA) 180 MG tablet Take 1 tablet by mouth daily.   fluticasone (FLONASE) 50 MCG/ACT nasal spray PLACE 2 SPRAYS INTO BOTH NOSTRILS DAILY.   Glucosamine-Chondroitin (MOVE FREE PO) Take 1 capsule by mouth in the morning and at bedtime.    Multiple Vitamins-Minerals (CENTRUM SILVER PO) Take 1 tablet by mouth daily.   predniSONE (DELTASONE) 5 MG tablet Take 1 tablet (5 mg total) by mouth daily with breakfast.   simvastatin (ZOCOR) 20 MG tablet TAKE ONE TABLET BY MOUTH DAILY AT 6PM.   TURMERIC PO Take 1 tablet by mouth daily.    [DISCONTINUED] escitalopram (LEXAPRO) 10 MG tablet TAKE ONE (1) TABLET BY MOUTH ONCE DAILY   [DISCONTINUED] levothyroxine (SYNTHROID) 50 MCG tablet TAKE (1) TABLET BY MOUTH DAILY BEFORE BREAKFAST.   [DISCONTINUED] omeprazole (PRILOSEC) 20 MG capsule TAKE ONE (1) CAPSULE EACH DAY.    PHQ 2/9 Scores 07/29/2021 06/04/2021 04/15/2021 03/27/2021  PHQ - 2 Score 0 0 0 0  PHQ- 9 Score 0 0 0 0    GAD 7 : Generalized Anxiety Score 07/29/2021 06/04/2021 03/27/2021 03/13/2021  Nervous, Anxious, on Edge 0 0 0 0  Control/stop worrying 0 0 0 0  Worry too much - different things 0 0 0 0  Trouble relaxing 0 0 0 0  Restless 0 0 0 0  Easily annoyed or irritable 0 0 0 0  Afraid - awful might happen 0 0  0 0  Total GAD 7 Score 0 0 0 0  Anxiety Difficulty Not difficult at all Not difficult at all - Not difficult at all    BP Readings from Last 3 Encounters:  07/29/21 128/64  06/04/21 130/70  04/15/21 132/78    Physical Exam Vitals and nursing note reviewed.  Constitutional:      General: She is not in acute distress.    Appearance: She is well-developed.  HENT:     Head: Normocephalic and atraumatic.  Cardiovascular:     Rate and Rhythm: Normal rate and regular rhythm.     Pulses: Normal pulses.  Pulmonary:     Effort: Pulmonary effort is normal. No respiratory distress.     Breath sounds: No wheezing or rhonchi.  Musculoskeletal:     Left shoulder: Bony tenderness present. Decreased range of motion.     Cervical back: Normal range of motion.     Right lower leg: No edema.     Left lower leg: No edema.  Lymphadenopathy:     Cervical: No cervical adenopathy.  Skin:    General: Skin is warm and dry.      Capillary Refill: Capillary refill takes less than 2 seconds.     Findings: No rash.  Neurological:     Mental Status: She is alert and oriented to person, place, and time.  Psychiatric:        Mood and Affect: Mood normal.        Behavior: Behavior normal.    Wt Readings from Last 3 Encounters:  07/29/21 167 lb (75.8 kg)  06/04/21 174 lb 6.4 oz (79.1 kg)  04/15/21 176 lb (79.8 kg)    BP 128/64 (BP Location: Right Arm, Cuff Size: Normal)    Pulse (!) 58    Ht 5' 5"  (1.651 m)    Wt 167 lb (75.8 kg)    SpO2 96%    BMI 27.79 kg/m   Assessment and Plan: 1. Benign hypertension Clinically stable exam with well controlled BP. Tolerating medications without side effects at this time. Pt to continue current regimen and low sodium diet; benefits of regular exercise as able discussed. - lisinopril-hydrochlorothiazide (ZESTORETIC) 20-12.5 MG tablet; TAKE (1) TABLET BY MOUTH EVERY DAY  Dispense: 90 tablet; Refill: 1  2. Type II diabetes mellitus with complication (HCC) Stable on diet alone. Hopefully this will continue to improve with prednisone dose reduction. - Hemoglobin A1c - Microalbumin / creatinine urine ratio  3. PMR (polymyalgia rheumatica) (HCC) Continue current dose of 5 mg.  Can try to reduce to 4 mg if Sed Rate is stable. - Sedimentation rate - predniSONE (DELTASONE) 5 MG tablet; Take 1 tablet (5 mg total) by mouth daily with breakfast.  Dispense: 90 tablet; Refill: 1  4. Acquired hypothyroidism supplemented - levothyroxine (SYNTHROID) 50 MCG tablet; TAKE (1) TABLET BY MOUTH DAILY BEFORE BREAKFAST.  Dispense: 90 tablet; Refill: 1  5. Pain of left shoulder joint on movement Recurrent OA pain/bursitis pain - has been injected by [facility] about 2 yrs ago. She would like to see Dr. Zigmund Daniel. Will get plain films for review. - DG Shoulder Left; Future  6. Gastroesophageal reflux disease without esophagitis Symptoms well controlled on daily PPI No red flag signs such as weight  loss, n/v, melena Will continue omeprazole. - omeprazole (PRILOSEC) 20 MG capsule; TAKE ONE (1) CAPSULE EACH DAY.  Dispense: 90 capsule; Refill: 1  7. Current moderate episode of major depressive disorder without prior episode ([place]) Clinically stable on  current regimen with good control of symptoms, No SI or HI. Will continue current therapy. - escitalopram (LEXAPRO) 10 MG tablet; TAKE ONE (1) TABLET BY MOUTH ONCE DAILY  Dispense: 90 tablet; Refill: 1   Partially dictated using Editor, commissioning. Any errors are unintentional.  Halina Maidens, MD [facility]  07/29/2021

## 2021-07-30 ENCOUNTER — Encounter: Payer: Self-pay | Admitting: Internal Medicine

## 2021-07-30 LAB — MICROALBUMIN / CREATININE URINE RATIO
Creatinine, Urine: 81.3 mg/dL
Microalb/Creat Ratio: <4 mg/g creat (ref 0–29)
Microalbumin, Urine: <3 ug/mL

## 2021-07-31 ENCOUNTER — Telehealth: Payer: Self-pay | Admitting: Internal Medicine

## 2021-07-31 ENCOUNTER — Encounter: Payer: Medicare PPO | Admitting: Family Medicine

## 2021-07-31 NOTE — Telephone Encounter (Signed)
Please let patient know that I am happy that her shoulder is improving, if her shoulder symptoms ever recur (hopefully not) we can evaluate her for additional treatments.

## 2021-07-31 NOTE — Telephone Encounter (Signed)
Copied from [place] [id number]. Topic: General - Other >> Jul 31, 2021  8:17 AM Alanda Slim E wrote: Reason for CRM:Pt called to cancel her appt today with Dr. Zigmund Daniel pt stated she is feeling a little better due to increase prednisone dose and wants to give the prednisone a chance before coming in for an appt / pt asked to give this message to DR. Zigmund Daniel nurse

## 2021-07-31 NOTE — Telephone Encounter (Signed)
For your information  

## 2021-07-31 NOTE — Telephone Encounter (Signed)
Called pt informed her to call us if she starts to have more shoulder pain. She verbalized understanding.  KP

## 2021-08-13 ENCOUNTER — Encounter: Payer: Self-pay | Admitting: Internal Medicine

## 2021-10-11 ENCOUNTER — Encounter: Payer: Self-pay | Admitting: Internal Medicine

## 2021-10-29 ENCOUNTER — Ambulatory Visit: Payer: Medicare PPO | Admitting: Internal Medicine

## 2021-10-29 ENCOUNTER — Encounter: Payer: Self-pay | Admitting: Internal Medicine

## 2021-10-29 VITALS — BP 138/74 | HR 74 | Ht 65.0 in | Wt 170.8 lb

## 2021-10-29 DIAGNOSIS — I700 Atherosclerosis of aorta: Secondary | ICD-10-CM | POA: Diagnosis not present

## 2021-10-29 DIAGNOSIS — I10 Essential (primary) hypertension: Secondary | ICD-10-CM

## 2021-10-29 DIAGNOSIS — M353 Polymyalgia rheumatica: Secondary | ICD-10-CM | POA: Diagnosis not present

## 2021-10-29 DIAGNOSIS — E118 Type 2 diabetes mellitus with unspecified complications: Secondary | ICD-10-CM | POA: Diagnosis not present

## 2021-10-29 MED ORDER — PREDNISONE 1 MG PO TABS: ORAL_TABLET | ORAL | 5 refills | Status: DC

## 2021-10-29 MED ORDER — PREDNISONE 5 MG PO TABS: ORAL_TABLET | ORAL | 1 refills | Status: DC

## 2021-10-29 NOTE — Progress Notes (Signed)
? ? ?Date:  10/29/2021  ? ?Name:  Jill Dunlap   DOB:  05-06-1935   MRN:  [id number] ? ? ?Chief Complaint: Hypertension ? ?Hypertension ?This is a chronic problem. The problem is controlled. Pertinent negatives include no chest pain, headaches, palpitations or shortness of breath. Past treatments include ACE inhibitors and diuretics.  ?Diabetes ?She presents for her follow-up diabetic visit. She has type 2 diabetes mellitus. Pertinent negatives for hypoglycemia include no headaches, nervousness/anxiousness or tremors. Pertinent negatives for diabetes include no chest pain, no fatigue, no polydipsia and no polyuria. Current diabetic treatment includes diet.  ?PMR - on very slow steroid taper.  Dose pulsed up after last visit with active symptoms.  Now taking 10 mg per day. ? ?Lab Results  ?Component Value Date  ? ESRSEDRATE 47 (H) 07/29/2021  ? ? ?Lab Results  ?Component Value Date  ? NA 138 11/22/2020  ? K 4.0 11/22/2020  ? CO2 22 11/22/2020  ? GLUCOSE 118 (H) 11/22/2020  ? BUN 14 11/22/2020  ? CREATININE 0.95 11/22/2020  ? CALCIUM 9.7 11/22/2020  ? EGFR 59 (L) 11/22/2020  ? GFRNONAA 52 (L) 05/09/2020  ? ?Lab Results  ?Component Value Date  ? CHOL 175 11/22/2020  ? HDL 75 11/22/2020  ? [place] 84 11/22/2020  ? TRIG 89 11/22/2020  ? CHOLHDL 2.3 11/22/2020  ? ?Lab Results  ?Component Value Date  ? TSH 2.180 11/22/2020  ? ?Lab Results  ?Component Value Date  ? HGBA1C 6.5 (H) 07/29/2021  ? ?Lab Results  ?Component Value Date  ? WBC 8.3 11/22/2020  ? HGB 13.8 11/22/2020  ? HCT 41.3 11/22/2020  ? MCV 89 11/22/2020  ? PLT 217 11/22/2020  ? ?Lab Results  ?Component Value Date  ? ALT 15 11/22/2020  ? AST 17 11/22/2020  ? ALKPHOS 32 (L) 11/22/2020  ? BILITOT 0.5 11/22/2020  ? ?No results found for: 25OHVITD2, [place], VD25OH  ? ?Review of Systems  ?Constitutional:  Negative for appetite change, fatigue, fever and unexpected weight change.  ?HENT:  Negative for tinnitus and trouble swallowing.   ?Eyes:  Negative for  visual disturbance.  ?Respiratory:  Negative for cough, chest tightness and shortness of breath.   ?Cardiovascular:  Negative for chest pain, palpitations and leg swelling.  ?Gastrointestinal:  Negative for abdominal pain.  ?Endocrine: Negative for polydipsia and polyuria.  ?Genitourinary:  Negative for dysuria and hematuria.  ?Musculoskeletal:  Positive for arthralgias (left shoulder). Negative for back pain, gait problem, joint swelling and myalgias.  ?Neurological:  Negative for tremors, numbness and headaches.  ?Psychiatric/Behavioral:  Negative for dysphoric mood and sleep disturbance. The patient is not nervous/anxious.   ? ?Patient Active Problem List  ? Diagnosis Date Noted  ? Left lumbar radiculopathy 03/13/2021  ? Piriformis syndrome, left 02/12/2021  ? Aortic atherosclerosis ([place]) 08/01/2020  ? Disorder of bursae of shoulder region 02/02/2020  ? PMR (polymyalgia rheumatica) (HCC) 02/02/2020  ? Primary osteoarthritis of left hip 01/24/2020  ? Recurrent major depression in partial remission ([place]) 12/28/2019  ? Diffuse photodamage of skin 02/13/2016  ? Basal cell carcinoma 01/11/2016  ? Gastroesophageal reflux disease without esophagitis 07/03/2015  ? Type II diabetes mellitus with complication ([place]) 61/44/3154  ? Acquired hypothyroidism 10/31/2014  ? Allergic rhinitis 10/31/2014  ? Benign hypertension 10/31/2014  ? CD (celiac disease) 10/31/2014  ? Dyslipidemia 10/31/2014  ? OP (osteoporosis) 10/31/2014  ? ? ?Allergies  ?Allergen Reactions  ? Ciprofloxacin Hcl Rash  ? ? ?Past Surgical History:  ?Procedure Laterality Date  ?  ABDOMINAL HYSTERECTOMY  1984  ? for bleeding  ? Basil cell    ? BILATERAL SALPINGOOPHORECTOMY  1984  ? CATARACT EXTRACTION W/PHACO Right 11/24/2016  ? Procedure: CATARACT EXTRACTION PHACO AND INTRAOCULAR LENS PLACEMENT ([place]) Right;  Surgeon: Leandrew Koyanagi, MD;  Location: [place];  Service: Ophthalmology;  Laterality: Right;  ? CATARACT EXTRACTION W/PHACO Left 02/04/2017   ? Procedure: CATARACT EXTRACTION PHACO AND INTRAOCULAR LENS PLACEMENT ([place])  Left;  Surgeon: Leandrew Koyanagi, MD;  Location: [place];  Service: Ophthalmology;  Laterality: Left;  ? ORIF WRIST FRACTURE Left 2015  ? TYMPANOPLASTY  1992  ? ? ?Social History  ? ?Tobacco Use  ? Smoking status: Never  ? Smokeless tobacco: Never  ?Vaping Use  ? Vaping Use: Never used  ?Substance Use Topics  ? Alcohol use: Never  ? Drug use: Never  ? ? ? ?Medication list has been reviewed and updated. ? ?Current Meds  ?Medication Sig  ? azelastine (ASTELIN) 0.1 % nasal spray INSTILL 2 SPRAYS IN EACH NOSTRIL TWICE DAILY AS DIRECTED  ? Biotin 2500 MCG CAPS Take 1 capsule by mouth daily.  ? calcium-vitamin D (OSCAL WITH D) 500-200 MG-UNIT tablet Take 1 tablet by mouth 2 (two) times daily.  ? Cranberry 500 MG CAPS Take 1 capsule by mouth daily.   ? escitalopram (LEXAPRO) 10 MG tablet TAKE ONE (1) TABLET BY MOUTH ONCE DAILY  ? fexofenadine (ALLEGRA) 180 MG tablet Take 1 tablet by mouth daily.  ? fluticasone (FLONASE) 50 MCG/ACT nasal spray PLACE 2 SPRAYS INTO BOTH NOSTRILS DAILY.  ? gabapentin (NEURONTIN) 100 MG capsule Take by mouth.  ? levothyroxine (SYNTHROID) 50 MCG tablet TAKE (1) TABLET BY MOUTH DAILY BEFORE BREAKFAST.  ? lisinopril-hydrochlorothiazide (ZESTORETIC) 20-12.5 MG tablet TAKE (1) TABLET BY MOUTH EVERY DAY  ? Multiple Vitamins-Minerals (CENTRUM SILVER PO) Take 1 tablet by mouth daily.  ? omeprazole (PRILOSEC) 20 MG capsule TAKE ONE (1) CAPSULE EACH DAY.  ? predniSONE (DELTASONE) 5 MG tablet Take 1 tablet (5 mg total) by mouth daily with breakfast. (Patient taking differently: Take 10 mg by mouth daily with breakfast.)  ? simvastatin (ZOCOR) 20 MG tablet TAKE ONE TABLET BY MOUTH DAILY AT 6PM.  ? TURMERIC PO Take 1 tablet by mouth daily.   ? ? ? ?  10/29/2021  ? 10:18 AM 07/29/2021  ?  9:03 AM 06/04/2021  ?  2:21 PM 03/27/2021  ?  8:59 AM  ?GAD 7 : Generalized Anxiety Score  ?Nervous, Anxious, on Edge 0 0 0 0   ?Control/stop worrying 0 0 0 0  ?Worry too much - different things 0 0 0 0  ?Trouble relaxing 0 0 0 0  ?Restless 0 0 0 0  ?Easily annoyed or irritable 0 0 0 0  ?Afraid - awful might happen 0 0 0 0  ?Total GAD 7 Score 0 0 0 0  ?Anxiety Difficulty Not difficult at all Not difficult at all Not difficult at all   ? ? ? ?  10/29/2021  ? 10:18 AM  ?Depression screen PHQ 2/9  ?Decreased Interest 0  ?Down, Depressed, Hopeless 0  ?PHQ - 2 Score 0  ?Altered sleeping 0  ?Tired, decreased energy 0  ?Change in appetite 0  ?Feeling bad or failure about yourself  0  ?Trouble concentrating 0  ?Moving slowly or fidgety/restless 0  ?Suicidal thoughts 0  ?PHQ-9 Score 0  ?Difficult doing work/chores Not difficult at all  ? ? ?BP Readings from Last 3 Encounters:  ?10/29/21  138/74  ?07/29/21 128/64  ?06/04/21 130/70  ? ? ?Physical Exam ?Vitals and nursing note reviewed.  ?Constitutional:   ?   General: She is not in acute distress. ?   Appearance: Normal appearance. She is well-developed.  ?HENT:  ?   Head: Normocephalic and atraumatic.  ?Neck:  ?   Vascular: No carotid bruit.  ?Cardiovascular:  ?   Rate and Rhythm: Normal rate and regular rhythm.  ?   Pulses: Normal pulses.  ?   Heart sounds: No murmur heard. ?Pulmonary:  ?   Effort: Pulmonary effort is normal. No respiratory distress.  ?   Breath sounds: No wheezing or rhonchi.  ?Musculoskeletal:  ?   Cervical back: Normal range of motion.  ?   Right lower leg: No edema.  ?   Left lower leg: No edema.  ?Lymphadenopathy:  ?   Cervical: No cervical adenopathy.  ?Skin: ?   General: Skin is warm and dry.  ?   Capillary Refill: Capillary refill takes less than 2 seconds.  ?   Findings: No rash.  ?Neurological:  ?   General: No focal deficit present.  ?   Mental Status: She is alert and oriented to person, place, and time.  ?Psychiatric:     ?   Mood and Affect: Mood normal.     ?   Behavior: Behavior normal.  ? ?Diabetic Foot Exam - Simple   ?Simple Foot Form ?Diabetic Foot exam was  performed with the following findings: Yes 10/29/2021 10:30 AM  ?Visual Inspection ?No deformities, no ulcerations, no other skin breakdown bilaterally: Yes ?Sensation Testing ?Intact to touch and monofilament testing b

## 2021-10-30 ENCOUNTER — Encounter: Payer: Self-pay | Admitting: Internal Medicine

## 2021-10-30 LAB — COMPREHENSIVE METABOLIC PANEL
ALT: 14 IU/L (ref 0–32)
AST: 16 IU/L (ref 0–40)
Albumin/Globulin Ratio: 1.9 (ref 1.2–2.2)
Albumin: 4.4 g/dL (ref 3.6–4.6)
Alkaline Phosphatase: 33 IU/L — ABNORMAL LOW (ref 44–121)
BUN/Creatinine Ratio: 13 (ref 12–28)
BUN: 11 mg/dL (ref 8–27)
Bilirubin Total: 0.5 mg/dL (ref 0.0–1.2)
CO2: 26 mmol/L (ref 20–29)
Calcium: 9.5 mg/dL (ref 8.7–10.3)
Chloride: 102 mmol/L (ref 96–106)
Creatinine, Ser: 0.82 mg/dL (ref 0.57–1.00)
Globulin, Total: 2.3 g/dL (ref 1.5–4.5)
Glucose: 124 mg/dL — ABNORMAL HIGH (ref 70–99)
Potassium: 4.2 mmol/L (ref 3.5–5.2)
Sodium: 143 mmol/L (ref 134–144)
Total Protein: 6.7 g/dL (ref 6.0–8.5)
eGFR: 70 mL/min/{1.73_m2} (ref >59)

## 2021-10-30 LAB — SEDIMENTATION RATE: Sed Rate: 8 mm/hr (ref 0–40)

## 2021-10-30 LAB — HEMOGLOBIN A1C
Est. average glucose Bld gHb Est-mCnc: 163 mg/dL
Hgb A1c MFr Bld: 7.3 % — ABNORMAL HIGH (ref 4.8–5.6)

## 2021-11-07 DIAGNOSIS — H903 Sensorineural hearing loss, bilateral: Secondary | ICD-10-CM | POA: Diagnosis not present

## 2021-11-07 DIAGNOSIS — H60392 Other infective otitis externa, left ear: Secondary | ICD-10-CM | POA: Diagnosis not present

## 2021-11-07 DIAGNOSIS — H6122 Impacted cerumen, left ear: Secondary | ICD-10-CM | POA: Diagnosis not present

## 2021-11-14 ENCOUNTER — Other Ambulatory Visit: Payer: Self-pay | Admitting: Internal Medicine

## 2021-11-14 DIAGNOSIS — K219 Gastro-esophageal reflux disease without esophagitis: Secondary | ICD-10-CM

## 2021-11-15 NOTE — Telephone Encounter (Signed)
Requested medication (s) are due for refill today: expired medication ? ?Requested medication (s) are on the active medication list: yes ? ?Last refill:  08/10/20 #48g 3 refills ? ?Future visit scheduled: yes in 2 months ? ?Notes to clinic:  not delegated per protocol, expired medication . Do you want to renew Rx? ? ? ?  ?Requested Prescriptions  ?Pending Prescriptions Disp Refills  ? fluticasone (FLONASE) 50 MCG/ACT nasal spray [Pharmacy Med Name: FLUTICASONE PROPIONATE 50 MCG/ACT N] 48 g 3  ?  Sig: PLACE 2 SPRAYS INTO BOTH NOSTRILS DAILY.  ?  ? Not Delegated - Ear, Nose, and Throat: Nasal Preparations - Corticosteroids Failed - 11/14/2021  1:35 PM  ?  ?  Failed - This refill cannot be delegated  ?  ?  Passed - Valid encounter within last 12 months  ?  Recent Outpatient Visits   ? ?      ? 2 weeks ago PMR (polymyalgia rheumatica) ([place])  ? [facility] Glean Hess, MD  ? 3 months ago Benign hypertension  ? [facility] Glean Hess, MD  ? 5 months ago PMR (polymyalgia rheumatica) ([place])  ? [facility] Glean [facility], MD  ? 7 months ago Benign hypertension  ? [facility] Glean Hess, MD  ? 8 months ago Left lumbar radiculopathy  ? [facility] [name], MD  ? ?  ?  ?Future Appointments   ? ?        ? In 2 months [place] Melia Jesse Sans, MD [facility], [place]  ? ?  ? ? ?  ?  ?  ?Signed Prescriptions Disp Refills  ? omeprazole (PRILOSEC) 20 MG capsule 90 capsule 1  ?  Sig: TAKE (1) CAPSULE BY MOUTH EVERY DAY  ?  ? Gastroenterology: Proton Pump Inhibitors Passed - 11/14/2021  1:35 PM  ?  ?  Passed - Valid encounter within last 12 months  ?  Recent Outpatient Visits   ? ?      ? 2 weeks ago PMR (polymyalgia rheumatica) ([place])  ? [facility] Glean Hess, MD  ? 3 months ago Benign hypertension  ? [facility] Glean Hess, MD  ? 5 months ago PMR (polymyalgia rheumatica) ([place])  ? [facility] Glean Hess, MD   ? 7 months ago Benign hypertension  ? [facility] Glean Hess, MD  ? 8 months ago Left lumbar radiculopathy  ? [facility] [name], MD  ? ?  ?  ?Future Appointments   ? ?        ? In 2 months Army Melia Jesse Sans, MD [facility], [place]  ? ?  ? ? ?  ?  ?  ? ?

## 2021-11-15 NOTE — Telephone Encounter (Signed)
Requested Prescriptions  ?Pending Prescriptions Disp Refills  ?? fluticasone (FLONASE) 50 MCG/ACT nasal spray [Pharmacy Med Name: FLUTICASONE PROPIONATE 50 MCG/ACT N] 48 g 3  ?  Sig: PLACE 2 SPRAYS INTO BOTH NOSTRILS DAILY.  ?  ? Not Delegated - Ear, Nose, and Throat: Nasal Preparations - Corticosteroids Failed - 11/14/2021  1:35 PM  ?  ?  Failed - This refill cannot be delegated  ?  ?  Passed - Valid encounter within last 12 months  ?  Recent Outpatient Visits   ?      ? 2 weeks ago PMR (polymyalgia rheumatica) ([place])  ? [facility] Glean Hess, MD  ? 3 months ago Benign hypertension  ? [facility] Glean Hess, MD  ? 5 months ago PMR (polymyalgia rheumatica) ([place])  ? [facility] Glean Hess, MD  ? 7 months ago Benign hypertension  ? [facility] Glean Hess, MD  ? 8 months ago Left lumbar radiculopathy  ? [facility] [name], MD  ?  ?  ?Future Appointments   ?        ? In 2 months Glean Hess, MD [facility], [facility]  ?  ? ?  ?  ?  ?? omeprazole (PRILOSEC) 20 MG capsule [Pharmacy Med Name: OMEPRAZOLE DR 20 MG CAP] 90 capsule 1  ?  Sig: TAKE (1) CAPSULE BY MOUTH EVERY DAY  ?  ? Gastroenterology: Proton Pump Inhibitors Passed - 11/14/2021  1:35 PM  ?  ?  Passed - Valid encounter within last 12 months  ?  Recent Outpatient Visits   ?      ? 2 weeks ago PMR (polymyalgia rheumatica) ([place])  ? [facility] Glean Hess, MD  ? 3 months ago Benign hypertension  ? [facility] Glean Hess, MD  ? 5 months ago PMR (polymyalgia rheumatica) ([place])  ? [facility], MD  ? 7 months ago Benign hypertension  ? [facility] Glean Hess, MD  ? 8 months ago Left lumbar radiculopathy  ? [facility] [name], MD  ?  ?  ?Future Appointments   ?        ? In 2 months Army Melia Jesse Sans, MD [facility], [place]  ?  ? ?  ?  ?  ? ?

## 2021-12-03 ENCOUNTER — Other Ambulatory Visit: Payer: Self-pay | Admitting: Internal Medicine

## 2021-12-03 DIAGNOSIS — E785 Hyperlipidemia, unspecified: Secondary | ICD-10-CM

## 2021-12-04 ENCOUNTER — Encounter: Payer: Self-pay | Admitting: Internal Medicine

## 2021-12-04 NOTE — Telephone Encounter (Signed)
Requested medication (s) are due for refill today: Yes ? ?Requested medication (s) are on the active medication list: Yes ? ?Last refill:  11/22/20 ? ?Future visit scheduled: Yes ? ?Notes to clinic:  Prescription expired. ? ? ? ?Requested Prescriptions  ?Pending Prescriptions Disp Refills  ? simvastatin (ZOCOR) 20 MG tablet [Pharmacy Med Name: SIMVASTATIN 20 MG TAB] 90 tablet 3  ?  Sig: TAKE (1) TABLET BY MOUTH EVERY DAY AT 6 IN THE EVENING  ?  ? Cardiovascular:  Antilipid - Statins Failed - 12/03/2021  8:13 AM  ?  ?  Failed - Lipid Panel in normal range within the last 12 months  ?  Cholesterol, Total  ?Date Value Ref Range Status  ?11/22/2020 175 100 - 199 mg/dL Final  ? ?LDL Chol Calc (NIH)  ?Date Value Ref Range Status  ?11/22/2020 84 0 - 99 mg/dL Final  ? ?HDL  ?Date Value Ref Range Status  ?11/22/2020 75 >39 mg/dL Final  ? ?Triglycerides  ?Date Value Ref Range Status  ?11/22/2020 89 0 - 149 mg/dL Final  ? ?  ?  ?  Passed - Patient is not pregnant  ?  ?  Passed - Valid encounter within last 12 months  ?  Recent Outpatient Visits   ? ?      ? 1 month ago PMR (polymyalgia rheumatica) ([place])  ? [facility] Glean Hess, MD  ? 4 months ago Benign hypertension  ? [facility] Glean Hess, MD  ? 6 months ago PMR (polymyalgia rheumatica) ([place])  ? [facility] Glean Hess, MD  ? 8 months ago Benign hypertension  ? [facility] Glean Hess, MD  ? 8 months ago Left lumbar radiculopathy  ? [facility] [name], MD  ? ?  ?  ?Future Appointments   ? ?        ? In 2 months Army Melia Jesse Sans, MD [facility], [place]  ? ?  ? ? ?  ?  ?  ? ?

## 2021-12-05 ENCOUNTER — Other Ambulatory Visit: Payer: Self-pay | Admitting: Internal Medicine

## 2021-12-05 DIAGNOSIS — E039 Hypothyroidism, unspecified: Secondary | ICD-10-CM

## 2021-12-05 MED ORDER — LEVOTHYROXINE SODIUM 50 MCG PO TABS: ORAL_TABLET | ORAL | 1 refills | Status: DC

## 2022-01-13 DIAGNOSIS — E119 Type 2 diabetes mellitus without complications: Secondary | ICD-10-CM | POA: Diagnosis not present

## 2022-01-13 LAB — HM DIABETES EYE EXAM

## 2022-01-18 ENCOUNTER — Other Ambulatory Visit: Payer: Self-pay | Admitting: Internal Medicine

## 2022-01-18 DIAGNOSIS — I10 Essential (primary) hypertension: Secondary | ICD-10-CM

## 2022-01-20 NOTE — Telephone Encounter (Signed)
Requested Prescriptions  Pending Prescriptions Disp Refills  . lisinopril-hydrochlorothiazide (ZESTORETIC) 20-12.5 MG tablet [Pharmacy Med Name: LISINOPRIL-HCTZ 20-12.5 MG TAB] 90 tablet 0    Sig: TAKE (1) TABLET BY MOUTH EVERY DAY     Cardiovascular:  ACEI + Diuretic Combos Passed - 01/18/2022  8:35 AM      Passed - Na in normal range and within 180 days    Sodium  Date Value Ref Range Status  10/29/2021 143 134 - 144 mmol/L Final         Passed - K in normal range and within 180 days    Potassium  Date Value Ref Range Status  10/29/2021 4.2 3.5 - 5.2 mmol/L Final         Passed - Cr in normal range and within 180 days    Creatinine, Ser  Date Value Ref Range Status  10/29/2021 0.82 0.57 - 1.00 mg/dL Final         Passed - eGFR is 30 or above and within 180 days    GFR calc Af Amer  Date Value Ref Range Status  01/27/2020 87 >59 mL/min/1.73 Final    Comment:    **[facility] currently reports eGFR in compliance with the current**   recommendations of the [employer]. [facility] will   update reporting as new guidelines are published from the NKF-ASN   Task force.    GFR, Estimated  Date Value Ref Range Status  05/09/2020 52 (L) >60 mL/min Final   eGFR  Date Value Ref Range Status  10/29/2021 70 >59 mL/min/1.73 Final         Passed - Patient is not pregnant      Passed - Last BP in normal range    BP Readings from Last 1 Encounters:  10/29/21 138/74         Passed - Valid encounter within last 6 months    Recent Outpatient Visits          2 months ago PMR (polymyalgia rheumatica) [facility])   [facility] Glean [facility], MD   5 months ago Benign hypertension   [place], [place]   7 months ago PMR (polymyalgia rheumatica) [facility])   [facility] Glean Hess, MD   9 months ago Benign hypertension   [place], MD   10 months ago Left lumbar radiculopathy   [facility] [name], MD      Future Appointments            In 2 weeks Army Melia Jesse Sans, MD [facility], [facility]

## 2022-02-05 ENCOUNTER — Encounter: Payer: Self-pay | Admitting: Internal Medicine

## 2022-02-05 ENCOUNTER — Ambulatory Visit: Payer: Medicare PPO | Admitting: Internal Medicine

## 2022-02-05 VITALS — BP 128/68 | HR 67 | Ht 65.0 in | Wt 170.0 lb

## 2022-02-05 DIAGNOSIS — F321 Major depressive disorder, single episode, moderate: Secondary | ICD-10-CM

## 2022-02-05 DIAGNOSIS — E039 Hypothyroidism, unspecified: Secondary | ICD-10-CM | POA: Diagnosis not present

## 2022-02-05 DIAGNOSIS — M353 Polymyalgia rheumatica: Secondary | ICD-10-CM | POA: Diagnosis not present

## 2022-02-05 DIAGNOSIS — E118 Type 2 diabetes mellitus with unspecified complications: Secondary | ICD-10-CM | POA: Diagnosis not present

## 2022-02-05 DIAGNOSIS — I10 Essential (primary) hypertension: Secondary | ICD-10-CM | POA: Diagnosis not present

## 2022-02-05 DIAGNOSIS — K591 Functional diarrhea: Secondary | ICD-10-CM | POA: Diagnosis not present

## 2022-02-05 LAB — POCT GLYCOSYLATED HEMOGLOBIN (HGB A1C): Hemoglobin A1C: 7.2 % — AB (ref 4.0–5.6)

## 2022-02-05 MED ORDER — ESCITALOPRAM OXALATE 10 MG PO TABS: ORAL_TABLET | ORAL | 1 refills | Status: DC

## 2022-02-05 NOTE — Progress Notes (Signed)
Date:  02/05/2022   Name:  Jill Dunlap   DOB:  [birth year]   MRN:  [id number]   Chief Complaint: Diabetes and Hypertension  Hypertension This is a chronic problem. The problem is controlled. Pertinent negatives include no chest pain, headaches, palpitations or shortness of breath. Past treatments include ACE inhibitors and diuretics.  Diabetes She presents for her follow-up diabetic visit. She has type 2 diabetes mellitus. Her disease course has been stable. Pertinent negatives for hypoglycemia include no dizziness, headaches or nervousness/anxiousness. Pertinent negatives for diabetes include no chest pain, no fatigue and no weakness. Symptoms are stable. Current diabetic treatment includes diet. She is compliant with treatment most of the time.  Diarrhea  This is a recurrent problem. The problem occurs 2 to 4 times per day. The problem has been waxing and waning. The stool consistency is described as Watery. The patient states that diarrhea does not awaken her from sleep. Pertinent negatives include no abdominal pain, coughing, headaches or myalgias. Nothing aggravates the symptoms.  PMR - on slow taper of prednisone.  Currently on 6 mg per day.  She will decrease to 5 mg next week.  Lab Results  Component Value Date   NA 143 10/29/2021   K 4.2 10/29/2021   CO2 26 10/29/2021   GLUCOSE 124 (H) 10/29/2021   BUN 11 10/29/2021   CREATININE 0.82 10/29/2021   CALCIUM 9.5 10/29/2021   EGFR 70 10/29/2021   GFRNONAA 52 (L) 05/09/2020   Lab Results  Component Value Date   CHOL 175 11/22/2020   HDL 75 11/22/2020   LDLCALC 84 11/22/2020   TRIG 89 11/22/2020   CHOLHDL 2.3 11/22/2020   Lab Results  Component Value Date   TSH 2.180 11/22/2020   Lab Results  Component Value Date   HGBA1C 7.2 (A) 02/05/2022   Lab Results  Component Value Date   WBC 8.3 11/22/2020   HGB 13.8 11/22/2020   HCT 41.3 11/22/2020   MCV 89 11/22/2020   PLT 217 11/22/2020   Lab Results   Component Value Date   ALT 14 10/29/2021   AST 16 10/29/2021   ALKPHOS 33 (L) 10/29/2021   BILITOT 0.5 10/29/2021   No results found for: "25OHVITD2", "25OHVITD3", "VD25OH"   Review of Systems  Constitutional:  Negative for fatigue and unexpected weight change.  HENT:  Negative for nosebleeds.   Eyes:  Negative for visual disturbance.  Respiratory:  Negative for cough, chest tightness, shortness of breath and wheezing.   Cardiovascular:  Negative for chest pain, palpitations and leg swelling.  Gastrointestinal:  Positive for diarrhea. Negative for abdominal pain and constipation.  Musculoskeletal:  Negative for gait problem and myalgias.  Neurological:  Negative for dizziness, weakness, light-headedness and headaches.  Psychiatric/Behavioral:  Negative for dysphoric mood and sleep disturbance. The patient is not nervous/anxious.     Patient Active Problem List   Diagnosis Date Noted   Left lumbar radiculopathy 03/13/2021   Piriformis syndrome, left 02/12/2021   Aortic atherosclerosis ([place]) 08/01/2020   Disorder of bursae of shoulder region 02/02/2020   PMR (polymyalgia rheumatica) ([place]) 02/02/2020   Primary osteoarthritis of left hip 01/24/2020   Recurrent major depression in partial remission ([place]) 12/28/2019   Diffuse photodamage of skin 02/13/2016   Basal cell carcinoma 01/11/2016   Gastroesophageal reflux disease without esophagitis 07/03/2015   Type II diabetes mellitus with complication ([place]) 37/10/8887   Acquired hypothyroidism 10/31/2014   Allergic rhinitis 10/31/2014   Benign hypertension 10/31/2014   CD (  celiac disease) 10/31/2014   Dyslipidemia 10/31/2014   OP (osteoporosis) 10/31/2014    Allergies  Allergen Reactions   Ciprofloxacin Hcl Rash    Past Surgical History:  Procedure Laterality Date   ABDOMINAL HYSTERECTOMY  1984   for bleeding   Bas[place] cell     BILATERAL SALPINGOOPHORECTOMY  1984   CATARACT EXTRACTION W/PHACO Right 11/24/2016   Procedure:  CATARACT EXTRACTION PHACO AND INTRAOCULAR LENS PLACEMENT ([place]) Right;  Surgeon: Leandrew Koyanagi, MD;  Location: [facility];  Service: Ophthalmology;  Laterality: Right;   CATARACT EXTRACTION W/PHACO Left 02/04/2017   Procedure: CATARACT EXTRACTION PHACO AND INTRAOCULAR LENS PLACEMENT ([place])  Left;  Surgeon: Leandrew Koyanagi, MD;  Location: [facility];  Service: Ophthalmology;  Laterality: Left;   ORIF WRIST FRACTURE Left 2015   TYMPANOPLASTY  1992    Social History   Tobacco Use   Smoking status: Never   Smokeless tobacco: Never  Vaping Use   Vaping Use: Never used  Substance Use Topics   Alcohol use: Never   Drug use: Never     Medication list has been reviewed and updated.  Current Meds  Medication Sig   azelastine (ASTELIN) 0.1 % nasal spray INSTILL 2 SPRAYS IN EACH NOSTRIL TWICE DAILY AS DIRECTED   Biotin 2500 MCG CAPS Take 1 capsule by mouth daily.   calcium-vitamin D (OSCAL WITH D) 500-200 MG-UNIT tablet Take 1 tablet by mouth 2 (two) times daily.   Cranberry 500 MG CAPS Take 1 capsule by mouth daily.    fexofenadine (ALLEGRA) 180 MG tablet Take 1 tablet by mouth daily.   fluticasone (FLONASE) 50 MCG/ACT nasal spray PLACE 2 SPRAYS INTO BOTH NOSTRILS DAILY.   levothyroxine (SYNTHROID) 50 MCG tablet TAKE (1) TABLET BY MOUTH DAILY BEFORE BREAKFAST.   lisinopril-hydrochlorothiazide (ZESTORETIC) 20-12.5 MG tablet TAKE (1) TABLET BY MOUTH EVERY DAY   Multiple Vitamins-Minerals (CENTRUM SILVER PO) Take 1 tablet by mouth daily.   omeprazole (PRILOSEC) 20 MG capsule TAKE (1) CAPSULE BY MOUTH EVERY DAY   predniSONE (DELTASONE) 1 MG tablet Take 4 tabs daily with a 5 mg tab = 9 mg per day   predniSONE (DELTASONE) 5 MG tablet Take one daily with 4 - 1 mg tabs for 9 mg daily   simvastatin (ZOCOR) 20 MG tablet TAKE (1) TABLET BY MOUTH EVERY DAY AT 6 IN THE EVENING   TURMERIC PO Take 1 tablet by mouth daily.    [DISCONTINUED] escitalopram (LEXAPRO) 10 MG tablet  TAKE ONE (1) TABLET BY MOUTH ONCE DAILY       02/05/2022   11:00 AM 10/29/2021   10:18 AM 07/29/2021    9:03 AM 06/04/2021    2:21 PM  GAD 7 : Generalized Anxiety Score  Nervous, Anxious, on Edge 0 0 0 0  Control/stop worrying 0 0 0 0  Worry too much - different things 0 0 0 0  Trouble relaxing 0 0 0 0  Restless 0 0 0 0  Easily annoyed or irritable 0 0 0 0  Afraid - awful might happen 0 0 0 0  Total GAD 7 Score 0 0 0 0  Anxiety Difficulty  Not difficult at all Not difficult at all Not difficult at all       02/05/2022   11:00 AM 10/29/2021   10:18 AM 07/29/2021    9:03 AM  Depression screen PHQ 2/9  Decreased Interest 0 0 0  Down, Depressed, Hopeless 0 0 0  PHQ - 2 Score 0 0 0  Altered sleeping 0 0 0  Tired, decreased energy 0 0 0  Change in appetite 0 0 0  Feeling bad or failure about yourself  0 0 0  Trouble concentrating 0 0 0  Moving slowly or fidgety/restless 0 0 0  Suicidal thoughts 0 0 0  PHQ-9 Score 0 0 0  Difficult doing work/chores Not difficult at all Not difficult at all Not difficult at all    BP Readings from Last 3 Encounters:  02/05/22 128/68  10/29/21 138/74  07/29/21 128/64    Physical Exam Vitals and nursing note reviewed.  Constitutional:      General: She is not in acute distress.    Appearance: She is well-developed.  HENT:     Head: Normocephalic and atraumatic.  Cardiovascular:     Rate and Rhythm: Normal rate and regular rhythm.     Pulses: Normal pulses.     Heart sounds: No murmur heard. Pulmonary:     Effort: Pulmonary effort is normal. No respiratory distress.     Breath sounds: No wheezing or rhonchi.  Musculoskeletal:     Cervical back: Normal range of motion.     Right lower leg: No edema.     Left lower leg: No edema.  Skin:    General: Skin is warm and dry.     Findings: No rash.  Neurological:     Mental Status: She is alert and oriented to person, place, and time.  Psychiatric:        Mood and Affect: Mood normal.         Behavior: Behavior normal.     Wt Readings from Last 3 Encounters:  02/05/22 170 lb (77.1 kg)  10/29/21 170 lb 12.8 oz (77.5 kg)  07/29/21 167 lb (75.8 kg)    BP 128/68 (BP Location: Right Arm, Cuff Size: Normal)   Pulse 67   Ht _0  (1.651 m)   Wt 170 lb (77.1 kg)   SpO2 95%   BMI 28.29 kg/m   Assessment and Plan: 1. Benign hypertension Clinically stable exam with well controlled BP. Tolerating medications without side effects at this time. Pt to continue current regimen and low sodium diet; benefits of regular exercise as able discussed.  2. Acquired hypothyroidism supplemented  3. Type II diabetes mellitus with complication (HCC) Clinically stable by exam and report without s/s of hypoglycemia. DM complicated by hypertension and dyslipidemia. Doing well with diet control. - POCT HgB A1C= 7.2 down from 7.3  4. PMR (polymyalgia rheumatica) (HCC) Continue slow taper by 1 mg every month. Go back to previous dose for 1-2 additional weeks if symptoms reappear  5. Current moderate episode of major depressive disorder without prior episode ([place]) Clinically stable on current regimen with good control of symptoms, No SI or HI. Will continue current therapy. - escitalopram (LEXAPRO) 10 MG tablet; TAKE ONE (1) TABLET BY MOUTH ONCE DAILY  Dispense: 90 tablet; Refill: 1  6. Functional diarrhea She has celiac disease but is compliant with gluten free foods Recommend trial of imodium every AM to control the majority of her symptoms - may need to see GI if persistent.   Partially dictated using Editor, commissioning. Any errors are unintentional.  Halina Maidens, MD [facility]  02/05/2022

## 2022-02-06 DIAGNOSIS — H6122 Impacted cerumen, left ear: Secondary | ICD-10-CM | POA: Diagnosis not present

## 2022-02-06 DIAGNOSIS — H60392 Other infective otitis externa, left ear: Secondary | ICD-10-CM | POA: Diagnosis not present

## 2022-02-07 ENCOUNTER — Encounter: Payer: Self-pay | Admitting: Internal Medicine

## 2022-03-03 ENCOUNTER — Other Ambulatory Visit: Payer: Self-pay | Admitting: Internal Medicine

## 2022-03-03 DIAGNOSIS — E785 Hyperlipidemia, unspecified: Secondary | ICD-10-CM

## 2022-03-03 NOTE — Telephone Encounter (Signed)
Requested Prescriptions  Pending Prescriptions Disp Refills  . simvastatin (ZOCOR) 20 MG tablet [Pharmacy Med Name: SIMVASTATIN 20 MG TAB] 90 tablet 0    Sig: TAKE (1) TABLET BY MOUTH EVERY DAY AT 6 IN THE EVENING     Cardiovascular:  Antilipid - Statins Failed - 03/03/2022  1:48 PM      Failed - Lipid Panel in normal range within the last 12 months    Cholesterol, Total  Date Value Ref Range Status  11/22/2020 175 100 - 199 mg/dL Final   LDL Chol Calc (NIH)  Date Value Ref Range Status  11/22/2020 84 0 - 99 mg/dL Final   HDL  Date Value Ref Range Status  11/22/2020 75 >39 mg/dL Final   Triglycerides  Date Value Ref Range Status  11/22/2020 89 0 - 149 mg/dL Final         Passed - Patient is not pregnant      Passed - Valid encounter within last 12 months    Recent Outpatient Visits          3 weeks ago Benign hypertension   [facility] Glean Hess, MD   4 months ago PMR (polymyalgia rheumatica) [facility])   [facility] Glean Hess, MD   7 months ago Benign hypertension   [facility] Glean Hess, MD   9 months ago PMR (polymyalgia rheumatica) [facility])   [facility] Glean Hess, MD   11 months ago Benign hypertension   [facility] Glean Hess, MD      Future Appointments            In 4 months [facility] Melia Jesse Sans, MD [facility], [facility]

## 2022-03-14 DIAGNOSIS — H903 Sensorineural hearing loss, bilateral: Secondary | ICD-10-CM | POA: Diagnosis not present

## 2022-04-16 ENCOUNTER — Ambulatory Visit (INDEPENDENT_AMBULATORY_CARE_PROVIDER_SITE_OTHER): Payer: Medicare PPO

## 2022-04-16 ENCOUNTER — Other Ambulatory Visit: Payer: Self-pay | Admitting: Internal Medicine

## 2022-04-16 VITALS — BP 130/72 | HR 60 | Ht 65.0 in | Wt 171.8 lb

## 2022-04-16 DIAGNOSIS — Z0000 Encounter for general adult medical examination without abnormal findings: Secondary | ICD-10-CM | POA: Diagnosis not present

## 2022-04-16 DIAGNOSIS — M353 Polymyalgia rheumatica: Secondary | ICD-10-CM

## 2022-04-16 DIAGNOSIS — Z23 Encounter for immunization: Secondary | ICD-10-CM | POA: Diagnosis not present

## 2022-04-16 MED ORDER — PREDNISONE 1 MG PO TABS: 1.0000 mg | ORAL_TABLET | Freq: Every day | ORAL | 1 refills | Status: DC

## 2022-04-16 MED ORDER — PREDNISONE 1 MG PO TABS: 3.0000 mg | ORAL_TABLET | Freq: Every day | ORAL | 1 refills | Status: DC

## 2022-04-16 NOTE — Progress Notes (Signed)
Subjective:   Jill Dunlap is a 87 y.o. female who presents for Medicare Annual (Subsequent) preventive examination.  I connected with  Aylah Yeary on 04/16/22 by an in person visit and verified that I am speaking with the correct person using two identifiers.  Patient Location: Other:  In Office  Provider Location: Office/Clinic   Review of Systems    Defer to PCP Cardiac Risk Factors include: advanced age (>49mn, >>51women)     Objective:    Today's Vitals   04/16/22 1517 04/16/22 1520  BP: 130/72   Pulse: 60   SpO2: 95%   Weight: 171 lb 12.8 oz (77.9 kg)   Height: 5' 5"  (1.651 m)   PainSc: 0-No pain 0-No pain   Body mass index is 28.59 kg/m.     04/16/2022    3:21 PM 04/15/2021    3:42 PM 03/21/2021   10:53 AM 04/11/2020   11:48 AM 01/26/2019   10:20 AM 04/20/2018   10:28 AM 11/02/2017    9:12 AM  Advanced Directives  Does Patient Have a Medical Advance Directive? Yes Yes Yes Yes Yes Yes Yes  Type of Advance Directive Living will [place] will  [place] will [place] City[place] will [place] will [place] will  Does patient want to make changes to medical advance directive?   No - Patient declined    Yes (MAU/Ambulatory/Procedural Areas - Information given)  Copy of HHillsidein Chart?  No - copy requested  No - copy requested No - copy requested  No - copy requested    Current Medications (verified) Outpatient Encounter Medications as of 04/16/2022  Medication Sig   azelastine (ASTELIN) 0.1 % nasal spray INSTILL 2 SPRAYS IN EACH NOSTRIL TWICE DAILY AS DIRECTED   Biotin 2500 MCG CAPS Take 1 capsule by mouth daily.   calcium-vitamin D (OSCAL WITH D) 500-200 MG-UNIT tablet Take 1 tablet by mouth 2 (two) times daily.   Cranberry 500 MG CAPS Take 1 capsule by mouth daily.    escitalopram (LEXAPRO) 10 MG tablet  TAKE ONE (1) TABLET BY MOUTH ONCE DAILY   fexofenadine (ALLEGRA) 180 MG tablet Take 1 tablet by mouth daily.   fluticasone (FLONASE) 50 MCG/ACT nasal spray PLACE 2 SPRAYS INTO BOTH NOSTRILS DAILY.   gabapentin (NEURONTIN) 100 MG capsule Take by mouth.   levothyroxine (SYNTHROID) 50 MCG tablet TAKE (1) TABLET BY MOUTH DAILY BEFORE BREAKFAST.   lisinopril-hydrochlorothiazide (ZESTORETIC) 20-12.5 MG tablet TAKE (1) TABLET BY MOUTH EVERY DAY   Multiple Vitamins-Minerals (CENTRUM SILVER PO) Take 1 tablet by mouth daily.   omeprazole (PRILOSEC) 20 MG capsule TAKE (1) CAPSULE BY MOUTH EVERY DAY   predniSONE (DELTASONE) 1 MG tablet Take 4 tabs daily with a 5 mg tab = 9 mg per day   predniSONE (DELTASONE) 5 MG tablet Take one daily with 4 - 1 mg tabs for 9 mg daily   simvastatin (ZOCOR) 20 MG tablet TAKE (1) TABLET BY MOUTH EVERY DAY AT 6 IN THE EVENING   TURMERIC PO Take 1 tablet by mouth daily.    [DISCONTINUED] Omeprazole (PRILOSEC PO) Take 1 tablet by mouth.   No facility-administered encounter medications on file as of 04/16/2022.    Allergies (verified) Ciprofloxacin hcl   History: Past Medical History:  Diagnosis Date   Acid reflux    Allergy    Cancer ([place]    skin ca on leg  head and back   Celiac disease    Current moderate episode of major depressive disorder without prior episode (HCC)    Gastroesophageal reflux disease without esophagitis    Hyperlipidemia    Hypertension    Hypothyroidism    OP (osteoporosis)    Piriformis syndrome, left 02/12/2021   Pre-diabetes    Primary osteoarthritis of left hip    Type II diabetes mellitus with complication (HCC)    Vertigo    no episodes in several years   Wears dentures    partial lower   Past Surgical History:  Procedure Laterality Date   ABDOMINAL HYSTERECTOMY  1984   for bleeding   Basil cell     BILATERAL SALPINGOOPHORECTOMY  1984   CATARACT EXTRACTION W/PHACO Right 11/24/2016   Procedure: CATARACT EXTRACTION PHACO AND  INTRAOCULAR LENS PLACEMENT ([place]) Right;  Surgeon: Leandrew Koyanagi, MD;  Location: [place];  Service: Ophthalmology;  Laterality: Right;   CATARACT EXTRACTION W/PHACO Left 02/04/2017   Procedure: CATARACT EXTRACTION PHACO AND INTRAOCULAR LENS PLACEMENT ([place])  Left;  Surgeon: Leandrew Koyanagi, MD;  Location: [place];  Service: Ophthalmology;  Laterality: Left;   ORIF WRIST FRACTURE Left 2015   TYMPANOPLASTY  1992   Family History  Problem Relation Age of Onset   Cancer Mother        oral   Hypertension Father    Heart disease Father    Breast cancer Neg Hx    Social History   Socioeconomic History   Marital status: Widowed    Spouse name: Not on file   Number of children: 2   Years of education: 16   Highest education level: Bachelor's degree (e.g., BA, AB, BS)  Occupational History   Occupation: Retired  Tobacco Use   Smoking status: Never   Smokeless tobacco: Never  Vaping Use   Vaping Use: Never used  Substance and Sexual Activity   Alcohol use: Never   Drug use: Never   Sexual activity: Not Currently    Partners: Male  Other Topics Concern   Not on file  Social History Narrative   Pt lives alone   Social Determinants of Health   Financial Resource Strain: Low Risk  (02/05/2022)   Overall Financial Resource Strain (CARDIA)    Difficulty of Paying Living Expenses: Not hard at all  Food Insecurity: No Food Insecurity (02/05/2022)   Hunger Vital Sign    Worried About Running Out of Food in the Last Year: Never true    Ran Out of Food in the Last Year: Never true  Transportation Needs: No Transportation Needs (02/05/2022)   PRAPARE - Hydrologist (Medical): No    Lack of Transportation (Non-Medical): No  Physical Activity: Sufficiently Active (04/16/2022)   Exercise Vital Sign    Days of Exercise per Week: 7 days    Minutes of Exercise per Session: 30 min  Stress: No Stress Concern Present (04/16/2022)    [place]    Feeling of Stress : Not at all  Social Connections: Moderately Isolated (04/16/2022)   Social Connection and Isolation Panel [NHANES]    Frequency of Communication with Friends and Family: More than three times a week    Frequency of Social Gatherings with Friends and Family: More than three times a week    Attends Religious Services: More than 4 times per year    Active Member of Clubs or Organizations: No  Attends Archivist Meetings: Never    Marital Status: Widowed    Tobacco Counseling Counseling given: Not Answered   Clinical Intake:  Pre-visit preparation completed: Yes  Pain : No/denies pain Pain Score: 0-No pain     BMI - recorded: 28.59 Nutritional Status: BMI 25 -29 Overweight Nutritional Risks: None Diabetes: No     Diabetic? No. Pre-diabetes.     Information entered by :: Wyatt Haste, CMA   Activities of Daily Living    04/16/2022    3:22 PM 10/29/2021   10:18 AM  In your present state of health, do you have any difficulty performing the following activities:  Hearing? 0 1  Vision? 0 0  Difficulty concentrating or making decisions? 0 0  Walking or climbing stairs? 1 0  Dressing or bathing? 0 0  Doing errands, shopping? 0 0  Preparing Food and eating ? N   Using the Toilet? N   In the past six months, have you accidently leaked urine? N   Do you have problems with loss of bowel control? N   Managing your Medications? N   Managing your Finances? N   Housekeeping or managing your Housekeeping? N     Patient Care Team: Glean Hess, MD as PCP - General (Internal Medicine) Lucilla Lame, MD as Consulting Physician (Gastroenterology) Corey Skains, MD as Consulting Physician (Cardiology) Leandrew Koyanagi, MD as Referring Physician (Ophthalmology)  Indicate any recent Medical Services you may have received from other than [facility] providers in  the past year (date may be approximate).     Assessment:   This is a routine wellness examination for Jill Dunlap.  Hearing/Vision screen Hearing Screening - Comments:: Wears hearing aids in both ears. Vision Screening - Comments:: Prescription glasses.  Dietary issues and exercise activities discussed: Current Exercise Habits: The patient does not participate in regular exercise at present   Goals Addressed   None   Depression Screen    04/16/2022    3:22 PM 02/05/2022   11:00 AM 10/29/2021   10:18 AM 07/29/2021    9:03 AM 06/04/2021    2:21 PM 04/15/2021    3:41 PM 03/27/2021    8:59 AM  PHQ 2/9 Scores  PHQ - 2 Score 0 0 0 0 0 0 0  PHQ- 9 Score 0 0 0 0 0 0 0    Fall Risk    04/16/2022    3:22 PM 02/05/2022   11:00 AM 10/29/2021   10:18 AM 07/29/2021    9:03 AM 06/04/2021    2:21 PM  Fall Risk   Falls in the past year? 1 0 1 1 0  Number falls in past yr: 1 0 1 1 0  Injury with Fall? 0 0 0 0 0  Risk for fall due to : History of fall(s) No Fall Risks No Fall Risks History of fall(s) No Fall Risks  Follow up Falls evaluation completed Falls evaluation completed Falls evaluation completed Falls evaluation completed Falls evaluation completed    [place]:  Any stairs in or around the home? No  If so, are there any without handrails?  N/A Home free of loose throw rugs in walkways, pet beds, electrical cords, etc? Yes  Adequate lighting in your home to reduce risk of falls? Yes   ASSISTIVE DEVICES UTILIZED TO PREVENT FALLS:  Life alert? No  Use of a cane, walker or w/c? No  Grab bars in the bathroom? Yes  Shower chair  or bench in shower? Yes  Elevated toilet seat or a handicapped toilet? Yes   TIMED UP AND GO:  Was the test performed? Yes .  Gait steady and fast without use of assistive device  Cognitive Function:        04/16/2022    3:23 PM 01/26/2019   10:23 AM 11/02/2017    9:15 AM 10/29/2016    9:35 AM  6CIT Screen  What Year? 0  points 0 points 0 points 0 points  What month? 0 points 0 points 0 points 0 points  What time? 0 points 0 points 0 points 0 points  Count back from 20 0 points 0 points 0 points 0 points  Months in reverse 0 points 0 points 0 points 0 points  Repeat phrase 4 points 0 points 2 points 0 points  Total Score 4 points 0 points 2 points 0 points    Immunizations Immunization History  Administered Date(s) Administered   Fluad Quad(high Dose 65+) 04/25/2020, 03/27/2021, 04/16/2022   Influenza, High Dose Seasonal PF 04/27/2017, 04/28/2019   Influenza-Unspecified 04/27/2015, 04/30/2018   PFIZER Comirnaty(Gray Top)Covid-19 Tri-Sucrose Vaccine 12/04/2020   PFIZER(Purple Top)SARS-COV-2 Vaccination 07/27/2019, 08/17/2019, 03/30/2020   Pfizer Covid-19 Vaccine Bivalent Booster 51yr & up 05/16/2021   Pneumococcal Conjugate-13 06/29/2014   Pneumococcal Polysaccharide-23 10/30/2008   Td 10/30/2008   Td (Adult), 2 Lf Tetanus Toxid, Preservative Free 10/30/2008   Zoster Recombinat (Shingrix) 04/07/2018, 06/09/2018   Zoster, Live 10/30/2008    TDAP status: Due, Education has been provided regarding the importance of this vaccine. Advised may receive this vaccine at local pharmacy or Health Dept. Aware to provide a copy of the vaccination record if obtained from local pharmacy or Health Dept. Verbalized acceptance and understanding.  Flu Vaccine status: Completed at today's visit  Pneumococcal vaccine status: Up to date  Covid-19 vaccine status: Completed vaccines  Qualifies for Shingles Vaccine? Yes   Zostavax completed No   Shingrix Completed?: Yes  Screening Tests Health Maintenance  Topic Date Due   TETANUS/TDAP  10/31/2018   COVID-19 Vaccine (6 - Pfizer risk series) 07/11/2021   MAMMOGRAM  01/22/2022   Diabetic kidney evaluation - Urine ACR  07/29/2022   HEMOGLOBIN A1C  08/08/2022   Diabetic kidney evaluation - GFR measurement  10/30/2022   FOOT EXAM  10/30/2022   OPHTHALMOLOGY EXAM   01/14/2023   Pneumonia Vaccine 87 Years old  Completed   INFLUENZA VACCINE  Completed   DEXA SCAN  Completed   Zoster Vaccines- Shingrix  Completed   HPV VACCINES  Aged Out    Health Maintenance  Health Maintenance Due  Topic Date Due   TETANUS/TDAP  10/31/2018   COVID-19 Vaccine (6 - Pfizer risk series) 07/11/2021   MAMMOGRAM  01/22/2022    Colorectal cancer screening: No longer required.   Mammogram status: Completed 01/22/2021. Repeat every year  Bone Density status: Completed 01/22/2021. Results reflect: Bone density results: OSTEOPENIA. Repeat every 2 years.  Lung Cancer Screening: (Low Dose CT Chest recommended if Age 85-80years, 30 pack-year currently smoking OR have quit w/in 15years.) does not qualify.   Lung Cancer Screening Referral: N/A  Additional Screening:  Hepatitis C Screening: does not qualify;  Vision Screening: Recommended annual ophthalmology exams for early detection of glaucoma and other disorders of the eye. Is the patient up to date with their annual eye exam?  Yes  Who is the provider or what is the name of the office in which the patient attends annual eye exams?  [facility]  If pt is not established with a provider, would they like to be referred to a provider to establish care?  N/A .   Dental Screening: Recommended annual dental exams for proper oral hygiene  Community Resource Referral / Chronic Care Management: CRR required this visit?  No   CCM required this visit?  No      Plan:     I have personally reviewed and noted the following in the patient's chart:   Medical and social history Use of alcohol, tobacco or illicit drugs  Current medications and supplements including opioid prescriptions. Patient is not currently taking opioid prescriptions. Functional ability and status Nutritional status Physical activity Advanced directives List of other physicians Hospitalizations, surgeries, and ER visits in previous 12  months Vitals Screenings to include cognitive, depression, and falls Referrals and appointments  In addition, I have reviewed and discussed with patient certain preventive protocols, quality metrics, and best practice recommendations. A written personalized care plan for preventive services as well as general preventive health recommendations were provided to patient.     Clista Bernhardt, [place]   04/16/2022     Jill Dunlap , Thank you for taking time to come for your Medicare Wellness Visit. I appreciate your ongoing commitment to your health goals. Please review the following plan we discussed and let me know if I can assist you in the future.   These are the goals we discussed:  Goals      DIET - INCREASE WATER INTAKE     Recommend to drink at least 6-8 8oz glasses of water per day.        This is a list of the screening recommended for you and due dates:  Health Maintenance  Topic Date Due   Tetanus Vaccine  10/31/2018   COVID-19 Vaccine (6 - Pfizer risk series) 07/11/2021   Mammogram  01/22/2022   Yearly kidney health urinalysis for diabetes  07/29/2022   Hemoglobin A1C  08/08/2022   Yearly kidney function blood test for diabetes  10/30/2022   Complete foot exam   10/30/2022   Eye exam for diabetics  01/14/2023   Pneumonia Vaccine  Completed   Flu Shot  Completed   DEXA scan (bone density measurement)  Completed   Zoster (Shingles) Vaccine  Completed   HPV Vaccine  Aged Out     Nurse Notes: Needs Refill for Prednisone 3 mg daily. Pharmacy [facility] [place].

## 2022-04-16 NOTE — Patient Instructions (Signed)

## 2022-05-12 ENCOUNTER — Other Ambulatory Visit: Payer: Self-pay | Admitting: Internal Medicine

## 2022-05-13 NOTE — Telephone Encounter (Signed)
Requested Prescriptions  Pending Prescriptions Disp Refills  . fluticasone (FLONASE) 50 MCG/ACT nasal spray [Pharmacy Med Name: FLUTICASONE PROPIONATE 50 MCG/ACT N] 48 g 0    Sig: PLACE 2 SPRAYS INTO BOTH NOSTRILS DAILY.     Ear, Nose, and Throat: Nasal Preparations - Corticosteroids Passed - 05/12/2022  1:12 PM      Passed - Valid encounter within last 12 months    Recent Outpatient Visits          3 months ago Benign hypertension   [place] [facility], Jesse Sans, MD   6 months ago PMR (polymyalgia rheumatica) [facility])   [place] [facility] at [facility], Jesse Sans, MD   9 months ago Benign hypertension   [place] [facility] at [facility], Jesse Sans, MD   11 months ago PMR (polymyalgia rheumatica) [facility])   [place] [facility] at [facility], Jesse Sans, MD   1 year ago Benign hypertension   [place] [facility] at [facility], Jesse Sans, MD      Future Appointments            In 1 month [facility] Melia, Jesse Sans, MD [place] [facility] at [facility], [facility]

## 2022-06-04 ENCOUNTER — Other Ambulatory Visit: Payer: Self-pay | Admitting: Internal Medicine

## 2022-06-04 DIAGNOSIS — I10 Essential (primary) hypertension: Secondary | ICD-10-CM

## 2022-06-04 NOTE — Telephone Encounter (Signed)
Requested Prescriptions  Pending Prescriptions Disp Refills   lisinopril-hydrochlorothiazide (ZESTORETIC) 20-12.5 MG tablet [Pharmacy Med Name: LISINOPRIL-HCTZ 20-12.5 MG TAB] 90 tablet 0    Sig: TAKE (1) TABLET BY MOUTH EVERY DAY     Cardiovascular:  ACEI + Diuretic Combos Failed - 06/04/2022  2:23 PM      Failed - Na in normal range and within 180 days    Sodium  Date Value Ref Range Status  10/29/2021 143 134 - 144 mmol/L Final         Failed - K in normal range and within 180 days    Potassium  Date Value Ref Range Status  10/29/2021 4.2 3.5 - 5.2 mmol/L Final         Failed - Cr in normal range and within 180 days    Creatinine, Ser  Date Value Ref Range Status  10/29/2021 0.82 0.57 - 1.00 mg/dL Final         Failed - eGFR is 30 or above and within 180 days    GFR calc Af Amer  Date Value Ref Range Status  01/27/2020 87 >59 mL/min/1.73 Final    Comment:    **[facility] currently reports eGFR in compliance with the current**   recommendations of the [employer]. [facility] will   update reporting as new guidelines are published from the NKF-ASN   Task force.    GFR, Estimated  Date Value Ref Range Status  05/09/2020 52 (L) >60 mL/min Final   eGFR  Date Value Ref Range Status  10/29/2021 70 >59 mL/min/1.73 Final         Passed - Patient is not pregnant      Passed - Last BP in normal range    BP Readings from Last 1 Encounters:  04/16/22 130/72         Passed - Valid encounter within last 6 months    Recent Outpatient Visits           3 months ago Benign hypertension   [place] [facility] at [facility], Jesse Sans, MD   7 months ago PMR (polymyalgia rheumatica) [facility])   [place] [facility] at [facility], Jesse Sans, MD   10 months ago Benign hypertension   [place] [facility] at [facility], Jesse Sans, MD   1 year ago PMR (polymyalgia  rheumatica) [facility])   [employer] [facility] at [facility], Jesse Sans, MD   1 year ago Benign hypertension   [place] [facility] at [facility], Jesse Sans, MD       Future Appointments             In 4 weeks Army Melia Jesse Sans, MD [place] [facility] at [facility], [facility]

## 2022-06-09 DIAGNOSIS — H903 Sensorineural hearing loss, bilateral: Secondary | ICD-10-CM | POA: Diagnosis not present

## 2022-06-09 DIAGNOSIS — J301 Allergic rhinitis due to pollen: Secondary | ICD-10-CM | POA: Diagnosis not present

## 2022-06-20 DIAGNOSIS — H903 Sensorineural hearing loss, bilateral: Secondary | ICD-10-CM | POA: Diagnosis not present

## 2022-07-03 ENCOUNTER — Encounter: Payer: Self-pay | Admitting: Internal Medicine

## 2022-07-03 ENCOUNTER — Other Ambulatory Visit
Admission: RE | Admit: 2022-07-03 | Discharge: 2022-07-03 | Disposition: A | Discharge status: Home / Self Care | Payer: Medicare PPO | Attending: Internal Medicine | Admitting: Internal Medicine

## 2022-07-03 ENCOUNTER — Ambulatory Visit (INDEPENDENT_AMBULATORY_CARE_PROVIDER_SITE_OTHER): Payer: Medicare PPO | Admitting: Internal Medicine

## 2022-07-03 VITALS — BP 128/60 | HR 77 | Ht 65.0 in | Wt 168.2 lb

## 2022-07-03 DIAGNOSIS — E1169 Type 2 diabetes mellitus with other specified complication: Secondary | ICD-10-CM | POA: Insufficient documentation

## 2022-07-03 DIAGNOSIS — K219 Gastro-esophageal reflux disease without esophagitis: Secondary | ICD-10-CM | POA: Insufficient documentation

## 2022-07-03 DIAGNOSIS — M720 Palmar fascial fibromatosis [Dupuytren]: Secondary | ICD-10-CM

## 2022-07-03 DIAGNOSIS — I10 Essential (primary) hypertension: Secondary | ICD-10-CM | POA: Insufficient documentation

## 2022-07-03 DIAGNOSIS — E118 Type 2 diabetes mellitus with unspecified complications: Secondary | ICD-10-CM | POA: Insufficient documentation

## 2022-07-03 DIAGNOSIS — Z0000 Encounter for general adult medical examination without abnormal findings: Secondary | ICD-10-CM | POA: Diagnosis not present

## 2022-07-03 DIAGNOSIS — K900 Celiac disease: Secondary | ICD-10-CM | POA: Insufficient documentation

## 2022-07-03 DIAGNOSIS — F3341 Major depressive disorder, recurrent, in partial remission: Secondary | ICD-10-CM

## 2022-07-03 DIAGNOSIS — E785 Hyperlipidemia, unspecified: Secondary | ICD-10-CM

## 2022-07-03 DIAGNOSIS — Z1231 Encounter for screening mammogram for malignant neoplasm of breast: Secondary | ICD-10-CM | POA: Diagnosis not present

## 2022-07-03 DIAGNOSIS — E039 Hypothyroidism, unspecified: Secondary | ICD-10-CM | POA: Diagnosis not present

## 2022-07-03 DIAGNOSIS — M353 Polymyalgia rheumatica: Secondary | ICD-10-CM | POA: Diagnosis not present

## 2022-07-03 LAB — CBC WITH DIFFERENTIAL/PLATELET
Abs Immature Granulocytes: 0.01 10*3/uL (ref 0.00–0.07)
Basophils Absolute: 0.1 10*3/uL (ref 0.0–0.1)
Basophils Relative: 1 %
Eosinophils Absolute: 0.2 10*3/uL (ref 0.0–0.5)
Eosinophils Relative: 2 %
HCT: 36.6 % (ref 36.0–46.0)
Hemoglobin: 12.3 g/dL (ref 12.0–15.0)
Immature Granulocytes: 0 %
Lymphocytes Relative: 14 %
Lymphs Abs: 1.2 10*3/uL (ref 0.7–4.0)
MCH: 29.1 pg (ref 26.0–34.0)
MCHC: 33.6 g/dL (ref 30.0–36.0)
MCV: 86.5 fL (ref 80.0–100.0)
Monocytes Absolute: 0.8 10*3/uL (ref 0.1–1.0)
Monocytes Relative: 9 %
Neutro Abs: 6.3 10*3/uL (ref 1.7–7.7)
Neutrophils Relative %: 74 %
Platelets: 245 10*3/uL (ref 150–400)
RBC: 4.23 MIL/uL (ref 3.87–5.11)
RDW: 13.9 % (ref 11.5–15.5)
WBC: 8.5 10*3/uL (ref 4.0–10.5)
nRBC: 0 % (ref 0.0–0.2)

## 2022-07-03 LAB — COMPREHENSIVE METABOLIC PANEL
ALT: 17 U/L (ref 0–44)
AST: 24 U/L (ref 15–41)
Albumin: 4.1 g/dL (ref 3.5–5.0)
Alkaline Phosphatase: 44 U/L (ref 38–126)
Anion gap: 8 (ref 5–15)
BUN: 21 mg/dL (ref 8–23)
CO2: 24 mmol/L (ref 22–32)
Calcium: 8.7 mg/dL — ABNORMAL LOW (ref 8.9–10.3)
Chloride: 104 mmol/L (ref 98–111)
Creatinine, Ser: 0.83 mg/dL (ref 0.44–1.00)
GFR, Estimated: >60 mL/min (ref >60)
Glucose, Bld: 138 mg/dL — ABNORMAL HIGH (ref 70–99)
Potassium: 3.8 mmol/L (ref 3.5–5.1)
Sodium: 136 mmol/L (ref 135–145)
Total Bilirubin: 0.7 mg/dL (ref 0.3–1.2)
Total Protein: 7.4 g/dL (ref 6.5–8.1)

## 2022-07-03 LAB — LIPID PANEL
Cholesterol: 164 mg/dL (ref 0–200)
HDL: 54 mg/dL (ref >40)
LDL Cholesterol: 94 mg/dL (ref 0–99)
Total CHOL/HDL Ratio: 3 RATIO
Triglycerides: 79 mg/dL (ref <150)
VLDL: 16 mg/dL (ref 0–40)

## 2022-07-03 LAB — TSH: TSH: 1.06 u[IU]/mL (ref 0.350–4.500)

## 2022-07-03 LAB — T4, FREE: Free T4: 0.74 ng/dL (ref 0.61–1.12)

## 2022-07-03 LAB — SEDIMENTATION RATE: Sed Rate: 29 mm/hr (ref 0–30)

## 2022-07-03 NOTE — Patient Instructions (Signed)
Call [facility] to schedule your mammogram at [phone].  Try a fiber supplement daily to help slow the bowel urgency.

## 2022-07-03 NOTE — Progress Notes (Signed)
Date:  07/03/2022   Name:  Jill Dunlap   DOB:  19-Apr-1935   MRN:  [id number]   Chief Complaint: Annual Exam Jill Dunlap is a 87 y.o. female who presents today for her Complete Annual Exam. She feels well. She reports exercising - walking. She reports she is sleeping well. Breast complaints - none.  Mammogram: 01/2021 DEXA: 01/2021 osteopenia Colonoscopy: 03/2010  Health Maintenance Due  Topic Date Due   DTaP/Tdap/Td (2 - Tdap) 10/31/2018   MAMMOGRAM  01/22/2022   Diabetic kidney evaluation - Urine ACR  07/29/2022    Immunization History  Administered Date(s) Administered   Fluad Quad(high Dose 65+) 04/25/2020, 03/27/2021, 04/16/2022   Influenza, High Dose Seasonal PF 04/27/2017, 04/28/2019   Influenza-Unspecified 04/27/2015, 04/30/2018   PFIZER Comirnaty(Gray Top)Covid-19 Tri-Sucrose Vaccine 12/04/2020   PFIZER(Purple Top)SARS-COV-2 Vaccination 07/27/2019, 08/17/2019, 03/30/2020   Pfizer Covid-19 Vaccine Bivalent Booster 46yr & up 05/16/2021   Pneumococcal Conjugate-13 06/29/2014   Pneumococcal Polysaccharide-23 10/30/2008   Td 10/30/2008   Td (Adult), 2 Lf Tetanus Toxid, Preservative Free 10/30/2008   Zoster Recombinat (Shingrix) 04/07/2018, 06/09/2018   Zoster, Live 10/30/2008    Hypertension This is a chronic problem. The problem is controlled. Pertinent negatives include no chest pain, headaches, palpitations or shortness of breath. Past treatments include ACE inhibitors and diuretics. The current treatment provides significant improvement. Hypertensive end-organ damage includes kidney disease. There is no history of CAD/MI or CVA. Identifiable causes of hypertension include a thyroid problem.  Hyperlipidemia This is a chronic problem. The problem is controlled. Pertinent negatives include no chest pain or shortness of breath. Current antihyperlipidemic treatment includes statins. The current treatment provides significant improvement of lipids.   Diabetes She presents for her follow-up diabetic visit. She has type 2 diabetes mellitus. Her disease course has been stable. Pertinent negatives for hypoglycemia include no dizziness, headaches, nervousness/anxiousness or tremors. Pertinent negatives for diabetes include no chest pain, no fatigue, no polydipsia and no polyuria. Pertinent negatives for diabetic complications include no CVA. Current diabetic treatment includes diet. She is following a generally healthy diet. An ACE inhibitor/angiotensin II receptor blocker is being taken. Eye exam is current.  Thyroid Problem Presents for follow-up visit. Symptoms include diarrhea (urgency). Patient reports no anxiety, constipation, fatigue, palpitations or tremors. The symptoms have been stable. Her past medical history is significant for hyperlipidemia.  Depression        This is a chronic problem.The problem is unchanged.  Associated symptoms include no fatigue and no headaches.  Past treatments include SSRIs - Selective serotonin reuptake inhibitors.  Compliance with treatment is good.  Past medical history includes thyroid problem.   PMR - very slowly weaned down on prednisone and stopped it last week.  She has some shoulder stiffness which is improved with one Aleve per day.  She other wise feels well. Diarrhea/fecal urgency - this is loose stools first thing in the morning and again after eating lunch.  No blood, mucous, pain or cramping.  She is following a gluten free diet as best she can.  She has not tried anything for this.    Lab Results  Component Value Date   NA 143 10/29/2021   K 4.2 10/29/2021   CO2 26 10/29/2021   GLUCOSE 124 (H) 10/29/2021   BUN 11 10/29/2021   CREATININE 0.82 10/29/2021   CALCIUM 9.5 10/29/2021   EGFR 70 10/29/2021   GFRNONAA 52 (L) 05/09/2020   Lab Results  Component Value Date   CHOL 175  11/22/2020   HDL 75 11/22/2020   LDLCALC 84 11/22/2020   TRIG 89 11/22/2020   CHOLHDL 2.3 11/22/2020   Lab  Results  Component Value Date   TSH 2.180 11/22/2020   Lab Results  Component Value Date   HGBA1C 7.2 (A) 02/05/2022   Lab Results  Component Value Date   WBC 8.3 11/22/2020   HGB 13.8 11/22/2020   HCT 41.3 11/22/2020   MCV 89 11/22/2020   PLT 217 11/22/2020   Lab Results  Component Value Date   ALT 14 10/29/2021   AST 16 10/29/2021   ALKPHOS 33 (L) 10/29/2021   BILITOT 0.5 10/29/2021   No results found for: "25OHVITD2", "25OHVITD3", "VD25OH"   Review of Systems  Constitutional:  Negative for chills, fatigue and fever.  HENT:  Negative for congestion, hearing loss, tinnitus, trouble swallowing and voice change.   Eyes:  Negative for visual disturbance.  Respiratory:  Negative for cough, chest tightness, shortness of breath and wheezing.   Cardiovascular:  Negative for chest pain, palpitations and leg swelling.  Gastrointestinal:  Positive for diarrhea (urgency). Negative for abdominal pain, blood in stool, constipation and vomiting.  Endocrine: Negative for polydipsia and polyuria.  Genitourinary:  Negative for dysuria, frequency, genital sores, vaginal bleeding and vaginal discharge.  Musculoskeletal:  Positive for arthralgias (both shoulders stiff and left fingers tightening). Negative for gait problem and joint swelling.  Skin:  Positive for wound (skin tear left forearm). Negative for color change and rash.  Neurological:  Negative for dizziness, tremors, light-headedness and headaches.  Hematological:  Negative for adenopathy. Does not bruise/bleed easily.  Psychiatric/Behavioral:  Positive for depression. Negative for dysphoric mood and sleep disturbance. The patient is not nervous/anxious.     Patient Active Problem List   Diagnosis Date Noted   Left lumbar radiculopathy 03/13/2021   Piriformis syndrome, left 02/12/2021   Aortic atherosclerosis ([place]) 08/01/2020   Disorder of bursae of shoulder region 02/02/2020   PMR (polymyalgia rheumatica) ([place]) 02/02/2020    Primary osteoarthritis of left hip 01/24/2020   Recurrent major depression in partial remission ([place]) 12/28/2019   Diffuse photodamage of skin 02/13/2016   Basal cell carcinoma 01/11/2016   Gastroesophageal reflux disease without esophagitis 07/03/2015   Type II diabetes mellitus with complication ([place]) 29/52/8413   Acquired hypothyroidism 10/31/2014   Allergic rhinitis 10/31/2014   Benign hypertension 10/31/2014   CD (celiac disease) 10/31/2014   Hyperlipidemia associated with type 2 diabetes mellitus ([place]) 10/31/2014   OP (osteoporosis) 10/31/2014    Allergies  Allergen Reactions   Ciprofloxacin Hcl Rash    Past Surgical History:  Procedure Laterality Date   ABDOMINAL HYSTERECTOMY  1984   for bleeding   [place] cell     BILATERAL SALPINGOOPHORECTOMY  1984   CATARACT EXTRACTION W/PHACO Right 11/24/2016   Procedure: CATARACT EXTRACTION PHACO AND INTRAOCULAR LENS PLACEMENT ([place]) Right;  Surgeon: Leandrew Koyanagi, MD;  Location: [place];  Service: Ophthalmology;  Laterality: Right;   CATARACT EXTRACTION W/PHACO Left 02/04/2017   Procedure: CATARACT EXTRACTION PHACO AND INTRAOCULAR LENS PLACEMENT ([place])  Left;  Surgeon: Leandrew Koyanagi, MD;  Location: [facility];  Service: Ophthalmology;  Laterality: Left;   ORIF WRIST FRACTURE Left 2015   TYMPANOPLASTY  1992    Social History   Tobacco Use   Smoking status: Never   Smokeless tobacco: Never  Vaping Use   Vaping Use: Never used  Substance Use Topics   Alcohol use: Never   Drug use: Never  Medication list has been reviewed and updated.  Current Meds  Medication Sig   azelastine (ASTELIN) 0.1 % nasal spray INSTILL 2 SPRAYS IN EACH NOSTRIL TWICE DAILY AS DIRECTED   Biotin 2500 MCG CAPS Take 1 capsule by mouth daily.   calcium-vitamin D (OSCAL WITH D) 500-200 MG-UNIT tablet Take 1 tablet by mouth 2 (two) times daily.   Cranberry 500 MG CAPS Take 1 capsule by mouth daily.    escitalopram  (LEXAPRO) 10 MG tablet TAKE ONE (1) TABLET BY MOUTH ONCE DAILY   fexofenadine (ALLEGRA) 180 MG tablet Take 1 tablet by mouth daily.   fluticasone (FLONASE) 50 MCG/ACT nasal spray PLACE 2 SPRAYS INTO BOTH NOSTRILS DAILY.   levothyroxine (SYNTHROID) 50 MCG tablet TAKE (1) TABLET BY MOUTH DAILY BEFORE BREAKFAST.   lisinopril-hydrochlorothiazide (ZESTORETIC) 20-12.5 MG tablet TAKE (1) TABLET BY MOUTH EVERY DAY   Multiple Vitamins-Minerals (CENTRUM SILVER PO) Take 1 tablet by mouth daily.   omeprazole (PRILOSEC) 20 MG capsule TAKE (1) CAPSULE BY MOUTH EVERY DAY   simvastatin (ZOCOR) 20 MG tablet TAKE (1) TABLET BY MOUTH EVERY DAY AT 6 IN THE EVENING   TURMERIC PO Take 1 tablet by mouth daily.    [DISCONTINUED] gabapentin (NEURONTIN) 100 MG capsule Take by mouth.   [DISCONTINUED] predniSONE (DELTASONE) 1 MG tablet Take 3 tablets (3 mg total) by mouth daily with breakfast.       07/03/2022   10:23 AM 02/05/2022   11:00 AM 10/29/2021   10:18 AM 07/29/2021    9:03 AM  GAD 7 : Generalized Anxiety Score  Nervous, Anxious, on Edge 0 0 0 0  Control/stop worrying 0 0 0 0  Worry too much - different things 0 0 0 0  Trouble relaxing 0 0 0 0  Restless 0 0 0 0  Easily annoyed or irritable 0 0 0 0  Afraid - awful might happen 0 0 0 0  Total GAD 7 Score 0 0 0 0  Anxiety Difficulty Not difficult at all  Not difficult at all Not difficult at all       07/03/2022   10:22 AM 04/16/2022    3:22 PM 02/05/2022   11:00 AM  Depression screen PHQ 2/9  Decreased Interest 0 0 0  Down, Depressed, Hopeless 0 0 0  PHQ - 2 Score 0 0 0  Altered sleeping 0 0 0  Tired, decreased energy 2 0 0  Change in appetite 0 0 0  Feeling bad or failure about yourself  0 0 0  Trouble concentrating 0 0 0  Moving slowly or fidgety/restless 0 0 0  Suicidal thoughts 0 0 0  PHQ-9 Score 2 0 0  Difficult doing work/chores Not difficult at all Not difficult at all Not difficult at all    BP Readings from Last 3 Encounters:   07/03/22 128/60  04/16/22 130/72  02/05/22 128/68    Physical Exam Vitals and nursing note reviewed.  Constitutional:      General: She is not in acute distress.    Appearance: She is well-developed.  HENT:     Head: Normocephalic and atraumatic.     Right Ear: Tympanic membrane and ear canal normal.     Left Ear: Tympanic membrane and ear canal normal.     Nose:     Right Sinus: No maxillary sinus tenderness.     Left Sinus: No maxillary sinus tenderness.  Eyes:     General: No scleral icterus.       Right  eye: No discharge.        Left eye: No discharge.     Conjunctiva/sclera: Conjunctivae normal.  Neck:     Thyroid: No thyromegaly.     Vascular: No carotid bruit.  Cardiovascular:     Rate and Rhythm: Normal rate and regular rhythm.     Pulses: Normal pulses.     Heart sounds: Normal heart sounds.  Pulmonary:     Effort: Pulmonary effort is normal. No respiratory distress.     Breath sounds: No wheezing.  Chest:  Breasts:    Right: No mass, nipple discharge, skin change or tenderness.     Left: No mass, nipple discharge, skin change or tenderness.  Abdominal:     General: Bowel sounds are normal.     Palpations: Abdomen is soft.     Tenderness: There is no abdominal tenderness.  Musculoskeletal:     Right shoulder: No tenderness, bony tenderness or crepitus. Normal range of motion.     Left shoulder: No tenderness, bony tenderness or crepitus. Normal range of motion.     Cervical back: Normal range of motion. No erythema.     Right lower leg: No edema.     Left lower leg: No edema.  Lymphadenopathy:     Cervical: No cervical adenopathy.  Skin:    General: Skin is warm and dry.     Findings: No rash.  Neurological:     Mental Status: She is alert and oriented to person, place, and time.     Cranial Nerves: No cranial nerve deficit.     Sensory: No sensory deficit.     Motor: No weakness.     Coordination: Coordination is intact.     Gait: Gait is intact.  Gait normal.     Deep Tendon Reflexes: Reflexes are normal and symmetric.  Psychiatric:        Attention and Perception: Attention normal.        Mood and Affect: Mood normal.     Wt Readings from Last 3 Encounters:  07/03/22 168 lb 3.2 oz (76.3 kg)  04/16/22 171 lb 12.8 oz (77.9 kg)  02/05/22 170 lb (77.1 kg)    BP 128/60   Pulse 77   Ht _0  (1.651 m)   Wt 168 lb 3.2 oz (76.3 kg)   SpO2 94%   BMI 27.99 kg/m   Assessment and Plan: 1. Annual physical exam Normal exam. Continue healthy diet and exercise.  2. Encounter for screening mammogram for breast cancer Schedule at [facility] - MM 3D SCREEN BREAST BILATERAL  3. Benign hypertension Clinically stable exam with well controlled BP. Tolerating medications without side effects at this time. Pt to continue current regimen and low sodium diet; benefits of regular exercise as able discussed. - CBC with Differential/Platelet - Comprehensive metabolic panel  4. Gastroesophageal reflux disease without esophagitis Symptoms well controlled on daily PPI No red flag signs such as weight loss, n/v, melena Will continue omeprazole. - CBC with Differential/Platelet  5. Acquired hypothyroidism Supplemented, will review labs and adjust dose if needed - TSH + free T4  6. Type II diabetes mellitus with complication (HCC) Continue diet control. - Hemoglobin A1c - Microalbumin / creatinine urine ratio  7. Hyperlipidemia associated with type 2 diabetes mellitus ([place]) Tolerating statin medication without side effects at this time LDL is at goal of < 70 on current dose Continue same therapy without change at this time. - Lipid panel  8. PMR (polymyalgia rheumatica) (HCC) Now weaned  off of prednisone. Shoulder symptoms are likely mild OA for which she can take Aleve prn Will check sed rate and continue to monitor symptoms  - Sedimentation rate  9. Recurrent major depression in partial remission (HCC) Clinically stable on current  regimen with good control of symptoms, No SI or HI. Will continue current therapy.  10. CD (celiac disease) Continue gluten free diet. 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 Any errors are unintentional.  Halina Maidens, MD [facility]  07/03/2022

## 2022-07-04 LAB — MICROALBUMIN / CREATININE URINE RATIO
Creatinine, Urine: 88.9 mg/dL
Microalb Creat Ratio: 5 mg/g creat (ref 0–29)
Microalb, Ur: 4.2 ug/mL — ABNORMAL HIGH

## 2022-07-05 LAB — HEMOGLOBIN A1C
Hgb A1c MFr Bld: 7.2 % — ABNORMAL HIGH (ref 4.8–5.6)
Mean Plasma Glucose: 160 mg/dL

## 2022-07-07 LAB — CELIAC DISEASE PANEL
Endomysial Ab, IgA: NEGATIVE
IgA: 140 mg/dL (ref 64–422)
Tissue Transglutaminase Ab, IgA: <2 U/mL (ref 0–3)

## 2022-07-18 ENCOUNTER — Other Ambulatory Visit: Payer: Self-pay | Admitting: Internal Medicine

## 2022-07-18 DIAGNOSIS — K219 Gastro-esophageal reflux disease without esophagitis: Secondary | ICD-10-CM

## 2022-08-06 ENCOUNTER — Encounter: Payer: Self-pay | Admitting: Internal Medicine

## 2022-08-07 ENCOUNTER — Ambulatory Visit
Admission: RE | Admit: 2022-08-07 | Discharge: 2022-08-07 | Disposition: A | Discharge status: Home / Self Care | Payer: Medicare PPO | Attending: Family Medicine | Admitting: Family Medicine

## 2022-08-07 ENCOUNTER — Ambulatory Visit
Admission: RE | Admit: 2022-08-07 | Discharge: 2022-08-07 | Disposition: A | Discharge status: Home / Self Care | Payer: Medicare PPO | Source: Ambulatory Visit | Attending: Family Medicine | Admitting: Family Medicine

## 2022-08-07 ENCOUNTER — Ambulatory Visit: Payer: Medicare PPO | Admitting: Family Medicine

## 2022-08-07 ENCOUNTER — Encounter: Payer: Self-pay | Admitting: Family Medicine

## 2022-08-07 VITALS — BP 126/78 | HR 70 | Ht 65.0 in | Wt 160.0 lb

## 2022-08-07 DIAGNOSIS — G8929 Other chronic pain: Secondary | ICD-10-CM | POA: Diagnosis not present

## 2022-08-07 DIAGNOSIS — M7989 Other specified soft tissue disorders: Secondary | ICD-10-CM | POA: Diagnosis not present

## 2022-08-07 DIAGNOSIS — M25531 Pain in right wrist: Secondary | ICD-10-CM | POA: Insufficient documentation

## 2022-08-07 DIAGNOSIS — M25819 Other specified joint disorders, unspecified shoulder: Secondary | ICD-10-CM

## 2022-08-07 MED ORDER — CELECOXIB 50 MG PO CAPS: 50.0000 mg | ORAL_CAPSULE | Freq: Every day | ORAL | 0 refills | Status: DC

## 2022-08-07 NOTE — Assessment & Plan Note (Signed)
Right-hand-dominant, noted in the setting of comorbid right chronic shoulder pain, tenderness at the radiocarpal and first [facility] locations, otherwise sensorimotor intact with full, painful range of motion, no overt crepitus.  Will order x-rays, placed on Celebrex, close follow-up.

## 2022-08-07 NOTE — Assessment & Plan Note (Signed)
Right-hand-dominant patient presenting with chronic bilateral shoulder pain, right greater than left, ongoing for greater than 1 year.  She denies any trauma at onset, has been gradually progressively worsening.  Localized to the anterior-lateral shoulder bilaterally with radiation to the right upper extremity, stopping at the elbow.  Denies any paresthesias, no neck pain.  These symptoms are aggravated with attempted reaching outwards, forwards, overhead, and behind the back significantly affecting her ADLs.  She has been utilizing her left arm for most tasks and is worried about worsening her left shoulder.  Of note, she had been on long-term prednisone for polymyalgia rheumatica, after that was stopped last year, she began to note gradual worsening of shoulder symptoms.  Examination with near full range of motion at the left shoulder in all planes, mild discomfort with far overhead/forward flexion.  Contralateral, right, shoulder with forward flexion to roughly 45 degrees, limited by pain and stiffness, can passively progress patient to 90 degrees where there is significant pain, abduction to 45 degrees, internal rotation/extension to back pocket.  Rotator cuff testing with 4 +/5 strength during internal and external rotation resisted stressing, isolated supraspinatus testing on the left with 4+/5 strength, minimally painful, 4 -/5 strength on right with significant pain recreating her stated symptomatology.  She has positive impingement testing on the right with positive Neer's and Hawkins, left benign, remainder of provocative testing bilaterally benign.  History and features are most consistent with rotator cuff related impingement bilaterally, right significantly worse than left.  Plan for 1 week course of low-dose Celebrex being mindful of her GI past medical history, close follow-up with low threshold to advance to subacromial corticosteroid injections.  Once symptoms adequately controlled, she would  benefit from home-based versus formal PT.

## 2022-08-07 NOTE — Patient Instructions (Signed)
-  Obtain x-rays - Take Celebrex daily with large meal - Return next week

## 2022-08-07 NOTE — Progress Notes (Signed)
Primary Care / Sports Medicine Office Visit  Patient Information:  Patient ID: Jill Dunlap, female DOB: 1934-12-12 Age: 88 y.o. MRN: [id number]   Jill Dunlap is a pleasant 88 y.o. female presenting with the following:  Chief Complaint  Patient presents with   Shoulder Pain    Both, left just started right for a long time    Vitals:   08/07/22 1532  BP: 126/78  Pulse: 70  SpO2: 98%   Vitals:   08/07/22 1532  Weight: 160 lb (72.6 kg)  Height: '5\' 5"'$  (1.651 m)   Body mass index is 26.63 kg/m.  No results found.   Independent interpretation of notes and tests performed by another provider:   None  Procedures performed:   None  Pertinent History, Exam, Impression, and Recommendations:   [name] was seen today for shoulder pain.  Shoulder impingement Assessment & Plan: Right-hand-dominant patient presenting with chronic bilateral shoulder pain, right greater than left, ongoing for greater than 1 year.  She denies any trauma at onset, has been gradually progressively worsening.  Localized to the anterior-lateral shoulder bilaterally with radiation to the right upper extremity, stopping at the elbow.  Denies any paresthesias, no neck pain.  These symptoms are aggravated with attempted reaching outwards, forwards, overhead, and behind the back significantly affecting her ADLs.  She has been utilizing her left arm for most tasks and is worried about worsening her left shoulder.  Of note, she had been on long-term prednisone for polymyalgia rheumatica, after that was stopped last year, she began to note gradual worsening of shoulder symptoms.  Examination with near full range of motion at the left shoulder in all planes, mild discomfort with far overhead/forward flexion.  Contralateral, right, shoulder with forward flexion to roughly 45 degrees, limited by pain and stiffness, can passively progress patient to 90 degrees where there is significant pain, abduction  to 45 degrees, internal rotation/extension to back pocket.  Rotator cuff testing with 4 +/5 strength during internal and external rotation resisted stressing, isolated supraspinatus testing on the left with 4+/5 strength, minimally painful, 4 -/5 strength on right with significant pain recreating her stated symptomatology.  She has positive impingement testing on the right with positive Neer's and Hawkins, left benign, remainder of provocative testing bilaterally benign.  History and features are most consistent with rotator cuff related impingement bilaterally, right significantly worse than left.  Plan for 1 week course of low-dose Celebrex being mindful of her GI past medical history, close follow-up with low threshold to advance to subacromial corticosteroid injections.  Once symptoms adequately controlled, she would benefit from home-based versus formal PT.  Orders: -     Celecoxib; Take 1 capsule (50 mg total) by mouth daily.  Dispense: 10 capsule; Refill: 0  Chronic pain of right wrist Assessment & Plan: Right-hand-dominant, noted in the setting of comorbid right chronic shoulder pain, tenderness at the radiocarpal and first [facility] locations, otherwise sensorimotor intact with full, painful range of motion, no overt crepitus.  Will order x-rays, placed on Celebrex, close follow-up.  Orders: -     Celecoxib; Take 1 capsule (50 mg total) by mouth daily.  Dispense: 10 capsule; Refill: 0 -     DG Wrist Complete Right; Future     Orders & Medications Meds ordered this encounter  Medications   celecoxib (CELEBREX) 50 MG capsule    Sig: Take 1 capsule (50 mg total) by mouth daily.    Dispense:  10 capsule  Refill:  0   Orders Placed This Encounter  Procedures   DG Wrist Complete Right     Return in about 6 days (around 08/13/2022) for 40 minute procedure.     [name], MD, [facility]   Primary Care Sports Medicine Primary Care and Sports Medicine at [facility]

## 2022-08-11 ENCOUNTER — Other Ambulatory Visit: Payer: Self-pay | Admitting: Internal Medicine

## 2022-08-11 ENCOUNTER — Encounter: Payer: Self-pay | Admitting: Internal Medicine

## 2022-08-11 ENCOUNTER — Ambulatory Visit (INDEPENDENT_AMBULATORY_CARE_PROVIDER_SITE_OTHER): Payer: Medicare PPO | Admitting: Internal Medicine

## 2022-08-11 VITALS — BP 120/50 | HR 77 | Ht 65.0 in | Wt 162.0 lb

## 2022-08-11 DIAGNOSIS — M25819 Other specified joint disorders, unspecified shoulder: Secondary | ICD-10-CM

## 2022-08-11 DIAGNOSIS — M353 Polymyalgia rheumatica: Secondary | ICD-10-CM | POA: Diagnosis not present

## 2022-08-11 DIAGNOSIS — G8929 Other chronic pain: Secondary | ICD-10-CM

## 2022-08-11 DIAGNOSIS — M25531 Pain in right wrist: Secondary | ICD-10-CM

## 2022-08-11 NOTE — Progress Notes (Signed)
Date:  08/11/2022   Name:  Jill Dunlap   DOB:  14-Feb-1935   MRN:  [id number]   Chief Complaint: Hand Pain (Right hand pain. Cannot do normal activity with it. Hand is swollen. Hard to grib and hold things.) and Generalized Body Aches (Wants to discuss the possibility of PMR flare up. Its not as bad as it was before. )  Hand Pain  There was no injury mechanism. The pain is present in the right wrist. The quality of the pain is described as burning and aching. The pain radiates to the right arm. The pain is moderate. The pain has been Fluctuating since the incident.  Shoulder pain is worse - having a hard time eating and dressing.  Xrays unremarkable.  She is taking Celebrex without much benefit.  More relief with Salonpas patches.  Lab Results  Component Value Date   NA 136 07/03/2022   K 3.8 07/03/2022   CO2 24 07/03/2022   GLUCOSE 138 (H) 07/03/2022   BUN 21 07/03/2022   CREATININE 0.83 07/03/2022   CALCIUM 8.7 (L) 07/03/2022   EGFR 70 10/29/2021   GFRNONAA >60 07/03/2022   Lab Results  Component Value Date   CHOL 164 07/03/2022   HDL 54 07/03/2022   LDLCALC 94 07/03/2022   TRIG 79 07/03/2022   CHOLHDL 3.0 07/03/2022   Lab Results  Component Value Date   TSH 1.060 07/03/2022   Lab Results  Component Value Date   HGBA1C 7.2 (H) 07/03/2022   Lab Results  Component Value Date   WBC 8.5 07/03/2022   HGB 12.3 07/03/2022   HCT 36.6 07/03/2022   MCV 86.5 07/03/2022   PLT 245 07/03/2022   Lab Results  Component Value Date   ALT 17 07/03/2022   AST 24 07/03/2022   ALKPHOS 44 07/03/2022   BILITOT 0.7 07/03/2022   No results found for: "25OHVITD2", "25OHVITD3", "VD25OH"   Review of Systems  Constitutional:  Negative for chills, fatigue and fever.  Respiratory:  Negative for chest tightness and shortness of breath.   Cardiovascular:  Negative for leg swelling.  Musculoskeletal:  Positive for arthralgias, joint swelling and myalgias.   Psychiatric/Behavioral:  Negative for sleep disturbance. The patient is not nervous/anxious.     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   Left lumbar radiculopathy 03/13/2021   Piriformis syndrome, left 02/12/2021   Aortic atherosclerosis ([place]) 08/01/2020   Shoulder impingement 02/02/2020   PMR (polymyalgia rheumatica) ([place]) 02/02/2020   Primary osteoarthritis of left hip 01/24/2020   Recurrent major depression in partial remission ([place]) 12/28/2019   Diffuse photodamage of skin 02/13/2016   Basal cell carcinoma 01/11/2016   Gastroesophageal reflux disease without esophagitis 07/03/2015   Type II diabetes mellitus with complication ([place]) 11/94/1740   Acquired hypothyroidism 10/31/2014   Allergic rhinitis 10/31/2014   Benign hypertension 10/31/2014   CD (celiac disease) 10/31/2014   Hyperlipidemia associated with type 2 diabetes mellitus ([place]) 10/31/2014   OP (osteoporosis) 10/31/2014    Allergies  Allergen Reactions   Ciprofloxacin Hcl Rash    Past Surgical History:  Procedure Laterality Date   ABDOMINAL HYSTERECTOMY  1984   for bleeding   Basil cell     BILATERAL SALPINGOOPHORECTOMY  1984   CATARACT EXTRACTION W/PHACO Right 11/24/2016   Procedure: CATARACT EXTRACTION PHACO AND INTRAOCULAR LENS PLACEMENT ([place]) Right;  Surgeon: Leandrew Koyanagi, MD;  Location: [place];  Service: Ophthalmology;  Laterality: Right;   CATARACT EXTRACTION W/PHACO Left 02/04/2017   Procedure: CATARACT EXTRACTION PHACO AND INTRAOCULAR LENS PLACEMENT ([place])  Left;  Surgeon: Leandrew Koyanagi, MD;  Location: [place];  Service: Ophthalmology;  Laterality: Left;   ORIF WRIST FRACTURE Left 2015   TYMPANOPLASTY  1992    Social History   Tobacco Use   Smoking status: Never   Smokeless tobacco: Never  Vaping Use   Vaping Use: Never used  Substance Use Topics   Alcohol use: Never    Drug use: Never     Medication list has been reviewed and updated.  Current Meds  Medication Sig   azelastine (ASTELIN) 0.1 % nasal spray INSTILL 2 SPRAYS IN EACH NOSTRIL TWICE DAILY AS DIRECTED   Biotin 2500 MCG CAPS Take 1 capsule by mouth daily.   calcium-vitamin D (OSCAL WITH D) 500-200 MG-UNIT tablet Take 1 tablet by mouth 2 (two) times daily.   celecoxib (CELEBREX) 50 MG capsule Take 1 capsule (50 mg total) by mouth daily.   Cranberry 500 MG CAPS Take 1 capsule by mouth daily.    escitalopram (LEXAPRO) 10 MG tablet TAKE ONE (1) TABLET BY MOUTH ONCE DAILY   fexofenadine (ALLEGRA) 180 MG tablet Take 1 tablet by mouth daily.   fluticasone (FLONASE) 50 MCG/ACT nasal spray PLACE 2 SPRAYS INTO BOTH NOSTRILS DAILY.   levothyroxine (SYNTHROID) 50 MCG tablet TAKE (1) TABLET BY MOUTH DAILY BEFORE BREAKFAST.   lisinopril-hydrochlorothiazide (ZESTORETIC) 20-12.5 MG tablet TAKE (1) TABLET BY MOUTH EVERY DAY   Multiple Vitamins-Minerals (CENTRUM SILVER PO) Take 1 tablet by mouth daily.   omeprazole (PRILOSEC) 20 MG capsule TAKE (1) CAPSULE BY MOUTH EVERY DAY   simvastatin (ZOCOR) 20 MG tablet TAKE (1) TABLET BY MOUTH EVERY DAY AT 6 IN THE EVENING   TURMERIC PO Take 1 tablet by mouth daily.        08/11/2022   11:18 AM 07/03/2022   10:23 AM 02/05/2022   11:00 AM 10/29/2021   10:18 AM  GAD 7 : Generalized Anxiety Score  Nervous, Anxious, on Edge 0 0 0 0  Control/stop worrying 0 0 0 0  Worry too much - different things 0 0 0 0  Trouble relaxing 0 0 0 0  Restless 0 0 0 0  Easily annoyed or irritable 0 0 0 0  Afraid - awful might happen 0 0 0 0  Total GAD 7 Score 0 0 0 0  Anxiety Difficulty Not difficult at all Not difficult at all  Not difficult at all       08/11/2022   11:18 AM 07/03/2022   10:22 AM 04/16/2022    3:22 PM  Depression screen PHQ 2/9  Decreased Interest 0 0 0  Down, Depressed, Hopeless 0 0 0  PHQ - 2 Score 0 0 0  Altered sleeping 0 0 0  Tired, decreased energy 0 2 0   Change in appetite 0 0 0  Feeling bad or failure about yourself  0 0 0  Trouble concentrating 0 0 0  Moving slowly or fidgety/restless 0 0 0  Suicidal thoughts 0 0 0  PHQ-9 Score 0 2 0  Difficult doing work/chores Not difficult at all Not difficult at all Not difficult at all    BP Readings from Last 3 Encounters:  08/11/22 (!) 120/50  08/07/22 126/78  07/03/22 128/60    Physical Exam Vitals and nursing note reviewed.  Constitutional:      General: She is not in  acute distress.    Appearance: Normal appearance. She is well-developed.  HENT:     Head: Normocephalic and atraumatic.  Cardiovascular:     Rate and Rhythm: Normal rate and regular rhythm.  Pulmonary:     Effort: Pulmonary effort is normal. No respiratory distress.  Musculoskeletal:     Right shoulder: Decreased range of motion.     Left shoulder: Decreased range of motion.     Right upper arm: Tenderness present.     Left upper arm: Tenderness present.     Right wrist: Swelling present. No tenderness, bony tenderness or snuff box tenderness. Decreased range of motion.  Skin:    General: Skin is warm and dry.     Findings: No rash.  Neurological:     Mental Status: She is alert and oriented to person, place, and time.  Psychiatric:        Mood and Affect: Mood normal.        Behavior: Behavior normal.     Wt Readings from Last 3 Encounters:  08/11/22 162 lb (73.5 kg)  08/07/22 160 lb (72.6 kg)  07/03/22 168 lb 3.2 oz (76.3 kg)    BP (!) 120/50   Pulse 77   Ht '5\' 5"'$  (1.651 m)   Wt 162 lb (73.5 kg)   SpO2 97%   BMI 26.96 kg/m   Assessment and Plan: Problem List Items Addressed This Visit       Other   Chronic pain of right wrist    Xray normal. May be tendonitis/synovitis ? PMR Continue topical patches if helpful      PMR (polymyalgia rheumatica) (H[facility]) - Primary (Chronic)    Off of prednisone since December. Now having more shoulder pain, movement limitations Right wrist swelling with  normal xray. Highly suspect this is PMR so will get ESR and resume prednisone if indicated      Relevant Orders   Sedimentation rate   Shoulder impingement (Chronic)    She is not eager for injections She is considering cancelling appointment for inj for now until Sed rate returns        Partially dictated using Editor, commissioning. Any errors are unintentional.  Halina Maidens, MD [facility]  08/11/2022

## 2022-08-11 NOTE — Assessment & Plan Note (Signed)
Xray normal. May be tendonitis/synovitis ? PMR Continue topical patches if helpful

## 2022-08-11 NOTE — Assessment & Plan Note (Signed)
She is not eager for injections She is considering cancelling appointment for inj for now until Sed rate returns

## 2022-08-11 NOTE — Assessment & Plan Note (Addendum)
Off of prednisone since December. Now having more shoulder pain, movement limitations Right wrist swelling with normal xray. Highly suspect this is PMR so will get ESR and resume prednisone if indicated

## 2022-08-12 LAB — SEDIMENTATION RATE: Sed Rate: 36 mm/hr (ref 0–40)

## 2022-08-14 ENCOUNTER — Ambulatory Visit: Payer: Medicare PPO | Admitting: Family Medicine

## 2022-08-14 ENCOUNTER — Ambulatory Visit (INDEPENDENT_AMBULATORY_CARE_PROVIDER_SITE_OTHER): Payer: Medicare PPO | Admitting: Family Medicine

## 2022-08-14 ENCOUNTER — Encounter: Payer: Self-pay | Admitting: Family Medicine

## 2022-08-14 ENCOUNTER — Inpatient Hospital Stay (INDEPENDENT_AMBULATORY_CARE_PROVIDER_SITE_OTHER): Payer: Medicare PPO | Admitting: Radiology

## 2022-08-14 VITALS — BP 120/60 | HR 77 | Ht 65.0 in | Wt 162.0 lb

## 2022-08-14 DIAGNOSIS — M25819 Other specified joint disorders, unspecified shoulder: Secondary | ICD-10-CM

## 2022-08-14 DIAGNOSIS — M25531 Pain in right wrist: Secondary | ICD-10-CM

## 2022-08-14 DIAGNOSIS — G8929 Other chronic pain: Secondary | ICD-10-CM

## 2022-08-14 MED ORDER — TRIAMCINOLONE ACETONIDE 40 MG/ML IJ SUSP
80.0000 mg | Freq: Once | INTRAMUSCULAR | Status: AC
  Administered 2022-08-14: 80 mg via INTRAMUSCULAR

## 2022-08-14 MED ORDER — CELECOXIB 50 MG PO CAPS: 50.0000 mg | ORAL_CAPSULE | Freq: Every day | ORAL | 0 refills | Status: DC

## 2022-08-14 NOTE — Progress Notes (Signed)
Primary Care / Sports Medicine Office Visit  Patient Information:  Patient ID: Jill Dunlap, female DOB: [birth year] Age: 88 y.o. MRN: [id number]   Jill Dunlap is a pleasant 88 y.o. female presenting with the following:  Chief Complaint  Patient presents with   Shoulder Pain    Right, still on medicaiton 3 days left    Vitals:   08/14/22 0901  BP: 120/60  Pulse: 77  SpO2: 96%   Vitals:   08/14/22 0901  Weight: 162 lb (73.5 kg)  Height: '5\' 5"'$  (1.651 m)   Body mass index is 26.96 kg/m.  DG Wrist Complete Right  Result Date: 08/09/2022 CLINICAL DATA:  Chronic radiocarpal and carpometacarpal pain. EXAM: RIGHT WRIST - COMPLETE 3+ VIEW COMPARISON:  None Available. FINDINGS: There is soft tissue swelling over the lateral wrist and ventral. There is no acute fracture or dislocation. The joint spaces are well maintained. There is no focal osseous lesion. IMPRESSION: Soft tissue swelling over the lateral wrist and ventral wrist. No acute fracture or dislocation. Electronically Signed   By: Ronney Asters M.D.   On: 08/09/2022 21:37     Independent interpretation of notes and tests performed by another provider:   Independent interpretation of right wrist x-rays dated 08/09/2022 reveal overall maintained radiocarpal and intercarpal space, there is subtle first [facility]MC degenerative changes with space narrowing and osteophyte formation, generalized soft tissue swelling noted, no acute osseous processes identified.  Procedures performed:   Procedure:  Injection of right subacromial space under ultrasound guidance. Ultrasound guidance utilized for in-plane approach to right subacromial space, visualize supraspinatus tendon with echogenicity consistent with chronic tendinopathy, no discrete tear noted Samsung HS60 device utilized with permanent recording / reporting. Verbal informed consent obtained and verified. Skin prepped in a sterile fashion. Ethyl chloride for topical  local analgesia.  Completed without difficulty and tolerated well. Medication: triamcinolone acetonide 40 mg/mL suspension for injection 1 mL total and 2 mL lidocaine 1% without epinephrine utilized for needle placement anesthetic Advised to contact for fevers/chills, erythema, induration, drainage, or persistent bleeding.  Procedure:  Injection of left subacromial space under ultrasound guidance. Ultrasound guidance utilized for in-plane approach to left subacromial space, visualized supraspinatus tendon without discrete abnormalities, no fluid visualized Samsung HS60 device utilized with permanent recording / reporting. Verbal informed consent obtained and verified. Skin prepped in a sterile fashion. Ethyl chloride for topical local analgesia.  Completed without difficulty and tolerated well. Medication: triamcinolone acetonide 40 mg/mL suspension for injection 1 mL total and 2 mL lidocaine 1% without epinephrine utilized for needle placement anesthetic Advised to contact for fevers/chills, erythema, induration, drainage, or persistent bleeding.   Pertinent History, Exam, Impression, and Recommendations:   Jill Dunlap was seen today for shoulder pain.  Shoulder impingement Assessment & Plan: Patient presents for follow-up to bilateral shoulder pain in the setting of impingement.  Known prior rheumatologic history.  Has had subtle interval response but still noting significant pain with reaching behind her back disrupting ADLs.  We reviewed various treatment strategies and patient did elect to proceed with bilateral subacromial corticosteroid injections.  She tolerated these well, post care reviewed.  I advised adjunct continued Celebrex x 7-10 days then transition to as needed dosing which should help the shoulders as well but primarily written for right wrist pain.  Will coordinate follow-up in 1 month for clinical reassessment.  If suboptimal progress noted the shoulders, can consider  advanced imaging, formal PT, rheumatology input, etc.  Orders: -  [place] LIMITED JOINT SPACE STRUCTURES UP BILAT; Future -     Celecoxib; Take 1 capsule (50 mg total) by mouth daily. X 1 week then daily as-needed (take with food)  Dispense: 30 capsule; Refill: 0 -     Triamcinolone Acetonide  Chronic pain of right wrist Assessment & Plan: Patient returns with chronic right wrist pain, has noted subtle improvement with daily Celebrex and OTC Salonpas usage.  We reviewed the x-rays together which do demonstrate subtle osteoarthritis at the first [facility], she is tender at the same region.  That being said, she has diffuse soft tissue swelling which may be indicative of her known past rheumatologic medical history.  Did note recent ESR being in elevated normal range.  We reviewed that the wrist pain is most likely multifactorial, stemming from relative overuse due to decreased function due to pain at the shoulders, baseline inflammation, and a portion of her pain from the first [facility] degenerative changes.  Plan for cock up wrist brace, continued Celebrex x 7-10 days then as needed, adjunct diclofenac 1%, can continue Salonpas.  Follow-up in 1 month for clinical reevaluation.  Orders: -     Celecoxib; Take 1 capsule (50 mg total) by mouth daily. X 1 week then daily as-needed (take with food)  Dispense: 30 capsule; Refill: 0     Orders & Medications Meds ordered this encounter  Medications   celecoxib (CELEBREX) 50 MG capsule    Sig: Take 1 capsule (50 mg total) by mouth daily. X 1 week then daily as-needed (take with food)    Dispense:  30 capsule    Refill:  0   triamcinolone acetonide (KENALOG-40) injection 80 mg   Orders Placed This Encounter  Procedures   [place] LIMITED JOINT SPACE STRUCTURES UP BILAT     Return in about 1 month (around 09/14/2022) for f/u shoulders and wrist.     [name], MD, [facility]   Primary Care Sports Medicine Primary Care and Sports Medicine at [facility]

## 2022-08-14 NOTE — Assessment & Plan Note (Signed)
Patient returns with chronic right wrist pain, has noted subtle improvement with daily Celebrex and OTC Salonpas usage.  We reviewed the x-rays together which do demonstrate subtle osteoarthritis at the first [facility], she is tender at the same region.  That being said, she has diffuse soft tissue swelling which may be indicative of her known past rheumatologic medical history.  Did note recent ESR being in elevated normal range.  We reviewed that the wrist pain is most likely multifactorial, stemming from relative overuse due to decreased function due to pain at the shoulders, baseline inflammation, and a portion of her pain from the first [facility] degenerative changes.  Plan for cock up wrist brace, continued Celebrex x 7-10 days then as needed, adjunct diclofenac 1%, can continue Salonpas.  Follow-up in 1 month for clinical reevaluation.

## 2022-08-14 NOTE — Assessment & Plan Note (Signed)
Patient presents for follow-up to bilateral shoulder pain in the setting of impingement.  Known prior rheumatologic history.  Has had subtle interval response but still noting significant pain with reaching behind her back disrupting ADLs.  We reviewed various treatment strategies and patient did elect to proceed with bilateral subacromial corticosteroid injections.  She tolerated these well, post care reviewed.  I advised adjunct continued Celebrex x 7-10 days then transition to as needed dosing which should help the shoulders as well but primarily written for right wrist pain.  Will coordinate follow-up in 1 month for clinical reassessment.  If suboptimal progress noted the shoulders, can consider advanced imaging, formal PT, rheumatology input, etc.

## 2022-08-14 NOTE — Patient Instructions (Addendum)
You have just been given a cortisone injection to reduce pain and inflammation. After the injection you may notice immediate relief of pain as a result of the Lidocaine. It is important to rest the area of the injection for 24 to 48 hours after the injection. There is a possibility of some temporary increased discomfort and swelling for up to 72 hours until the cortisone begins to work. If you do have pain, simply rest the joint and use ice. If you can tolerate over the counter medications, you can try Tylenol for added relief per package instructions. - As above, relative rest x 2 days then gradually return to normal activity - Continue Celebrex x 1 week (take with food) - After 1 week can continue Celebrex daily as-needed - Start topical Voltaren gel (diclofenac 1%) twice daily until symptoms resolve - Can continue salon-pas - Use wrist brace (see attached) throughout your wakeful / active hours, remove for hand hygiene and sleep - Return in 1 month - Contact for questions

## 2022-08-15 ENCOUNTER — Other Ambulatory Visit: Payer: Self-pay | Admitting: Internal Medicine

## 2022-08-15 DIAGNOSIS — F321 Major depressive disorder, single episode, moderate: Secondary | ICD-10-CM

## 2022-08-15 NOTE — Telephone Encounter (Signed)
Requested Prescriptions  Pending Prescriptions Disp Refills   escitalopram (LEXAPRO) 10 MG tablet [Pharmacy Med Name: ESCITALOPRAM OXALATE 10 MG TAB] 90 tablet 1    Sig: TAKE (1) TABLET BY MOUTH EVERY DAY     Psychiatry:  Antidepressants - SSRI Passed - 08/15/2022  8:33 AM      Passed - Completed PHQ-2 or PHQ-9 in the last 360 days      Passed - Valid encounter within last 6 months    Recent Outpatient Visits           Yesterday Shoulder impingement   [place] [facility] at [place], Earley Abide, MD   4 days ago PMR (polymyalgia rheumatica) [facility])   [place] [facility] at [facility], Jesse Sans, MD   1 week ago Shoulder impingement   [place] [facility] [place] [facility] [place], Earley Abide, MD   1 month ago Annual physical exam   [place] at [facility], Jesse Sans, MD   6 months ago Benign hypertension   [place] at [facility], Jesse Sans, MD       Future Appointments             In 1 month Zigmund Daniel, Earley Abide, MD [place] at [facility], [facility]   In 2 months Army Melia, Jesse Sans, MD [place] at [facility], [facility]   In 10 months Army Melia, Jesse Sans, MD [place] at [facility], [facility]

## 2022-08-29 ENCOUNTER — Other Ambulatory Visit: Payer: Self-pay | Admitting: Internal Medicine

## 2022-08-29 DIAGNOSIS — I10 Essential (primary) hypertension: Secondary | ICD-10-CM

## 2022-08-29 DIAGNOSIS — E039 Hypothyroidism, unspecified: Secondary | ICD-10-CM

## 2022-09-10 IMAGING — MG MM DIGITAL SCREENING BILAT W/ TOMO AND CAD
8 series · 8 of 24 positions shown · non-contrast
Comparison: Previous exam(s).

CLINICAL DATA: Screening.

EXAM:
DIGITAL SCREENING BILATERAL MAMMOGRAM WITH TOMOSYNTHESIS AND CAD
TECHNIQUE: Bilateral screening digital craniocaudal and mediolateral oblique
mammograms were obtained. Bilateral screening digital breast
tomosynthesis was performed. The images were evaluated with
computer-aided detection.

[R CC synth-2D]
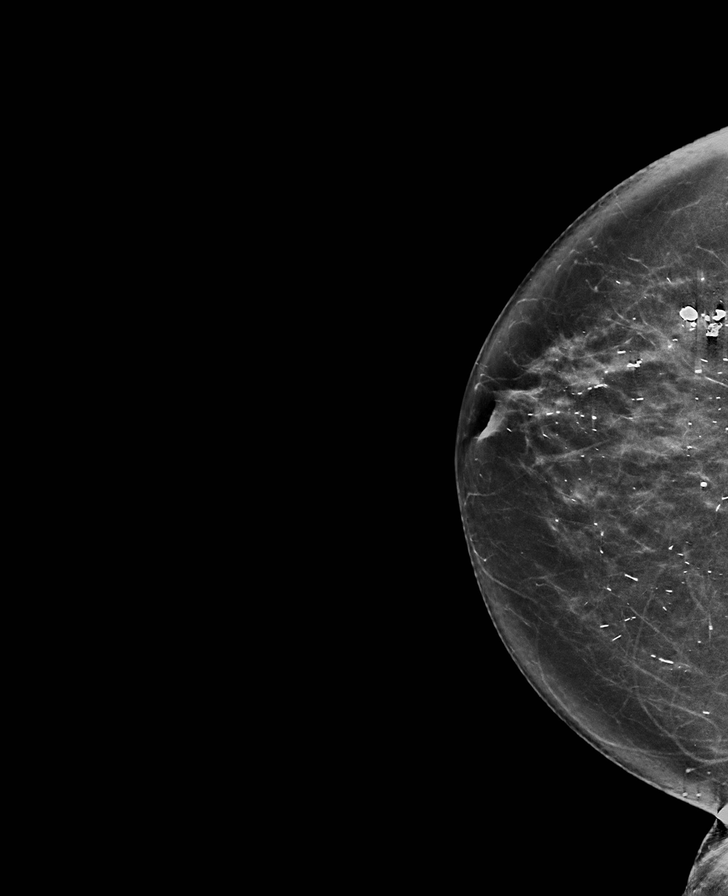

[L MLO synth-2D]
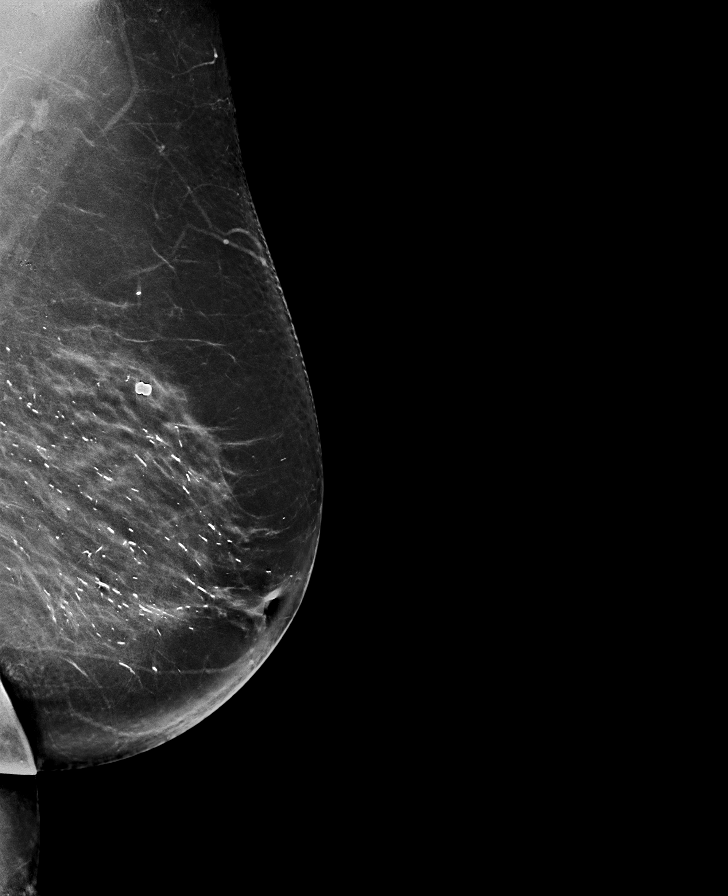

[L CC synth-2D]
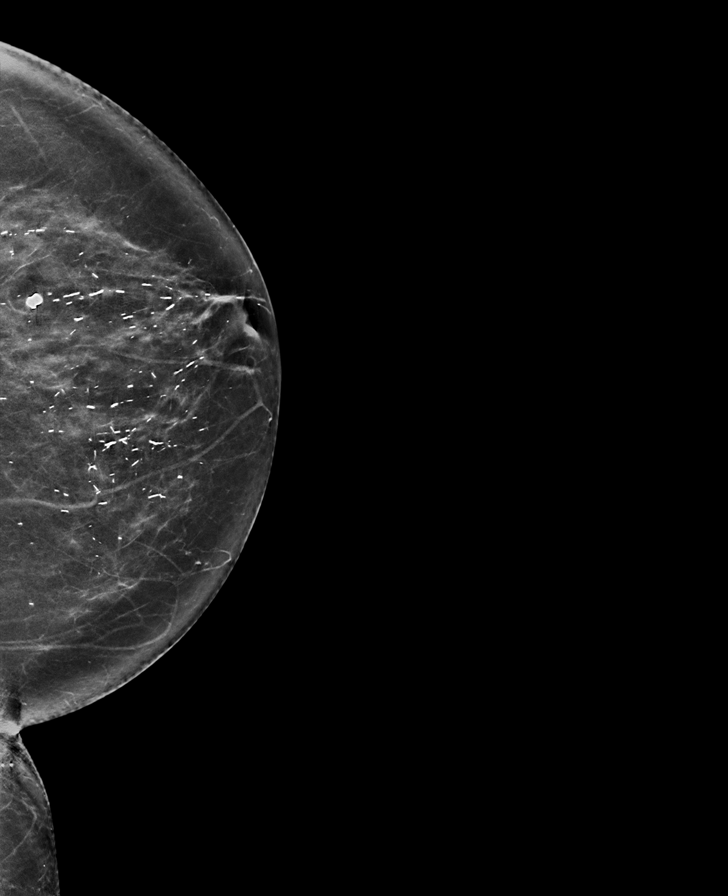

[R MLO synth-2D]
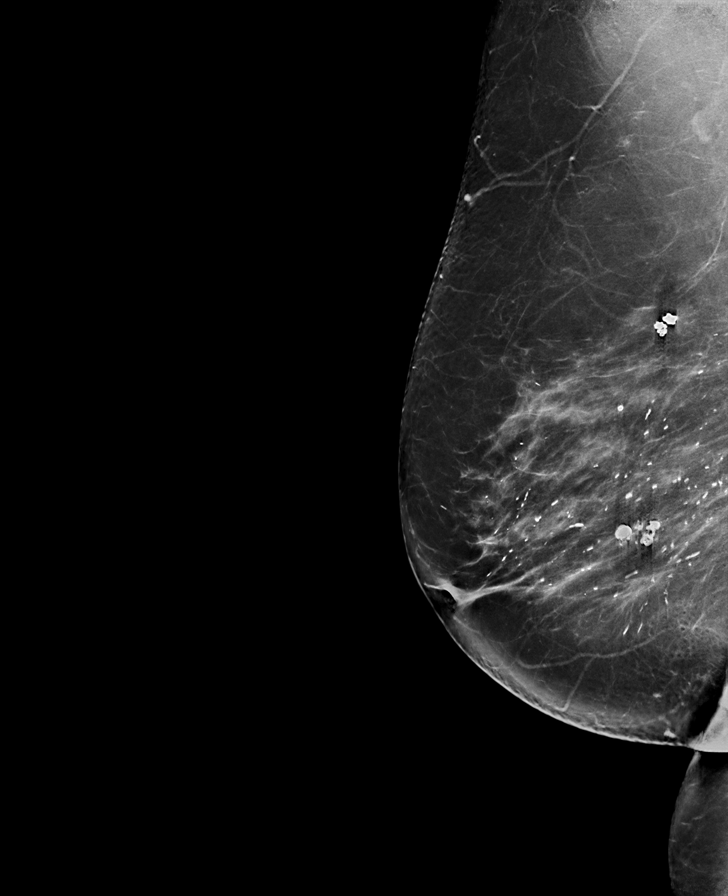

[L MLO tomo · tomo slice 45/90.0]
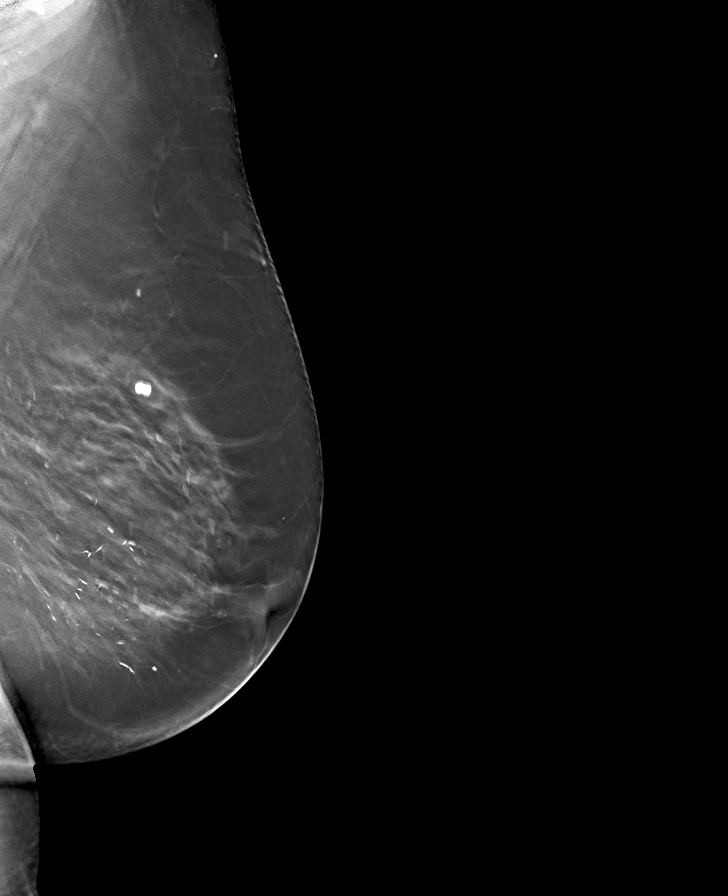

[R MLO tomo · tomo slice 45/89.0]
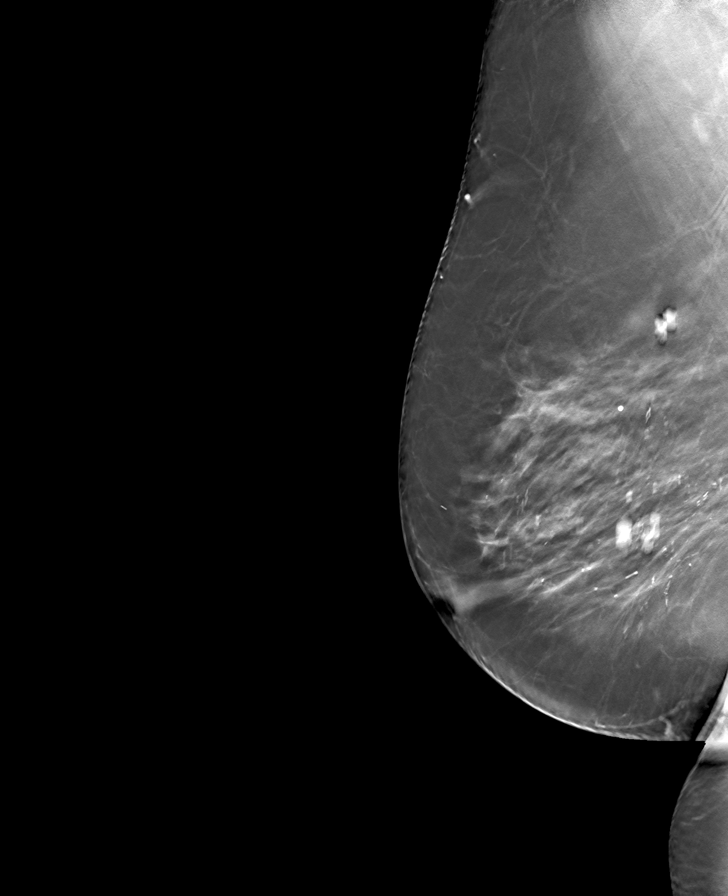

[R CC tomo · tomo slice 37/74.0]
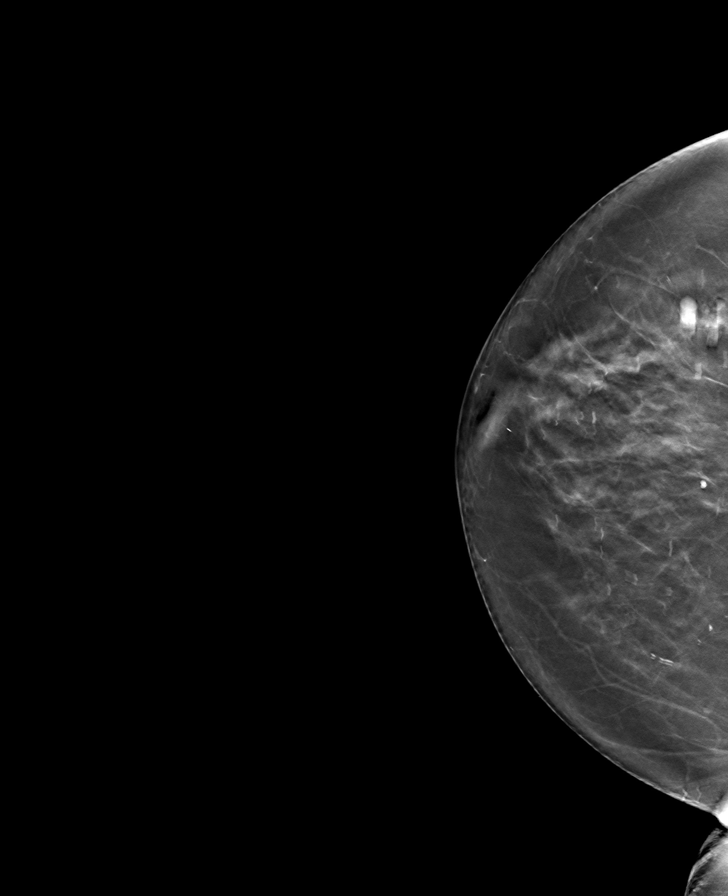

[L CC tomo · tomo slice 37/72.0]
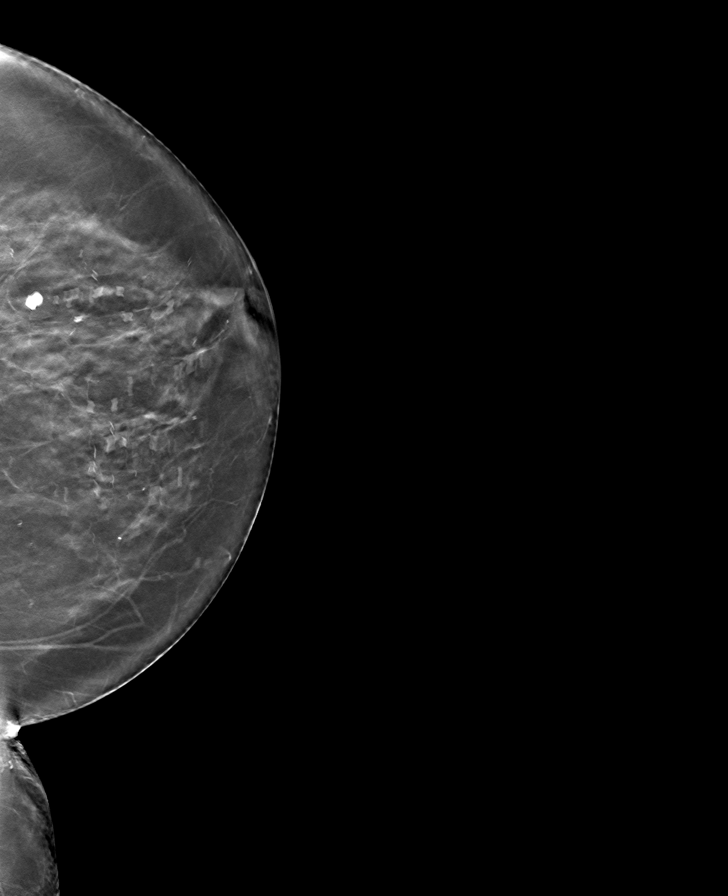

[8 of 24 positions shown; findings below may reference images not displayed]

ACR Breast Density Category c: The breast tissue is heterogeneously
dense, which may obscure small masses.
FINDINGS: There are no findings suspicious for malignancy.
IMPRESSION: No mammographic evidence of malignancy. A result letter of this
screening mammogram will be mailed directly to the patient.

RECOMMENDATION:
Screening mammogram in one year. (Code:Q3-W-BC3)

BI-RADS CATEGORY  1: Negative.

## 2022-09-15 ENCOUNTER — Ambulatory Visit: Payer: Medicare PPO | Admitting: Family Medicine

## 2022-10-02 ENCOUNTER — Ambulatory Visit: Payer: Medicare PPO | Admitting: Family Medicine

## 2022-10-02 ENCOUNTER — Encounter: Payer: Self-pay | Admitting: Family Medicine

## 2022-10-02 ENCOUNTER — Encounter: Payer: Self-pay | Admitting: Internal Medicine

## 2022-10-02 DIAGNOSIS — M25819 Other specified joint disorders, unspecified shoulder: Secondary | ICD-10-CM | POA: Diagnosis not present

## 2022-10-02 MED ORDER — CELECOXIB 50 MG PO CAPS: 50.0000 mg | ORAL_CAPSULE | Freq: Every day | ORAL | 0 refills | Status: DC | PRN

## 2022-10-02 NOTE — Assessment & Plan Note (Signed)
Acute on chronic condition affecting bilateral shoulders, of note at last visit on 08/14/2022 she tolerated bilateral subacromial corticosteroid injections.  This has provided excellent pain control, minimal need for Celebrex.  Examination today with preserved rotator cuff strength, negative impingement, mild pain with maximal forward flexion and abduction of the left shoulder but otherwise reassuring clinical findings.  Given her excellent interval progress I have advised transition to as needed Celebrex, continue to work on home exercises, and she can follow-up as needed.

## 2022-10-02 NOTE — Patient Instructions (Signed)
-   Transition to as needed dosing of Celebrex - Continue regular activity with the shoulders, use symptoms as a guide, try not to "overdo it " - Follow-up as needed, can contact us if interested in repeat injections

## 2022-10-02 NOTE — Progress Notes (Signed)
     Primary Care / Sports Medicine Office Visit  Patient Information:  Patient ID: Jill Dunlap, female DOB: 1934-12-20 Age: 88 y.o. MRN: [id number]   Jill Dunlap is a pleasant 88 y.o. female presenting with the following:  Chief Complaint  Patient presents with   Shoulder impingement    Much better    Vitals:   10/02/22 0908  BP: 108/68  Pulse: 76  SpO2: 98%   Vitals:   10/02/22 0908  Weight: 159 lb (72.1 kg)  Height: 5\' 5"  (1.651 m)   Body mass index is 26.46 kg/m.  No results found.   Independent interpretation of notes and tests performed by another provider:   None  Procedures performed:   None  Pertinent History, Exam, Impression, and Recommendations:   [name] was seen today for shoulder impingement.  Shoulder impingement Assessment & Plan: Acute on chronic condition affecting bilateral shoulders, of note at last visit on 08/14/2022 she tolerated bilateral subacromial corticosteroid injections.  This has provided excellent pain control, minimal need for Celebrex.  Examination today with preserved rotator cuff strength, negative impingement, mild pain with maximal forward flexion and abduction of the left shoulder but otherwise reassuring clinical findings.  Given her excellent interval progress I have advised transition to as needed Celebrex, continue to work on home exercises, and she can follow-up as needed.  Orders: -     Celecoxib; Take 1 capsule (50 mg total) by mouth daily as needed for pain.  Dispense: 30 capsule; Refill: 0     Orders & Medications Meds ordered this encounter  Medications   celecoxib (CELEBREX) 50 MG capsule    Sig: Take 1 capsule (50 mg total) by mouth daily as needed for pain.    Dispense:  30 capsule    Refill:  0   No orders of the defined types were placed in this encounter.    No follow-ups on file.     [name], MD, [facility]   Primary Care Sports Medicine Primary Care and Sports Medicine  at [facility]

## 2022-10-03 NOTE — Telephone Encounter (Signed)
Pt response.  KP

## 2022-11-03 ENCOUNTER — Encounter: Payer: Self-pay | Admitting: Internal Medicine

## 2022-11-03 ENCOUNTER — Ambulatory Visit: Payer: Medicare PPO | Admitting: Internal Medicine

## 2022-11-03 VITALS — BP 116/74 | HR 75 | Ht 65.0 in | Wt 157.0 lb

## 2022-11-03 DIAGNOSIS — I700 Atherosclerosis of aorta: Secondary | ICD-10-CM | POA: Diagnosis not present

## 2022-11-03 DIAGNOSIS — E1169 Type 2 diabetes mellitus with other specified complication: Secondary | ICD-10-CM

## 2022-11-03 DIAGNOSIS — K900 Celiac disease: Secondary | ICD-10-CM

## 2022-11-03 DIAGNOSIS — F321 Major depressive disorder, single episode, moderate: Secondary | ICD-10-CM | POA: Insufficient documentation

## 2022-11-03 DIAGNOSIS — M353 Polymyalgia rheumatica: Secondary | ICD-10-CM | POA: Diagnosis not present

## 2022-11-03 DIAGNOSIS — I10 Essential (primary) hypertension: Secondary | ICD-10-CM | POA: Diagnosis not present

## 2022-11-03 DIAGNOSIS — E118 Type 2 diabetes mellitus with unspecified complications: Secondary | ICD-10-CM

## 2022-11-03 DIAGNOSIS — E785 Hyperlipidemia, unspecified: Secondary | ICD-10-CM | POA: Diagnosis not present

## 2022-11-03 LAB — POCT GLYCOSYLATED HEMOGLOBIN (HGB A1C): Hemoglobin A1C: 7 % — AB (ref 4.0–5.6)

## 2022-11-03 NOTE — Assessment & Plan Note (Signed)
Clinically stable exam with well controlled BP on lisinopril and hctz. Tolerating medications without side effects. Pt to continue current regimen and low sodium diet.  

## 2022-11-03 NOTE — Assessment & Plan Note (Signed)
On statin therapy without side effects  Lab Results  Component Value Date   LDLCALC 94 07/03/2022

## 2022-11-03 NOTE — Assessment & Plan Note (Signed)
On appropriate statin therapy 

## 2022-11-03 NOTE — Assessment & Plan Note (Addendum)
Onset 12/2019 with eventual tapering off steroids 03/2022 No symptoms to suggest recurrence Will continue to monitor

## 2022-11-03 NOTE — Assessment & Plan Note (Signed)
Much improved with minimal symptoms She is taking Lexapro PRN - can likely stop completely

## 2022-11-03 NOTE — Progress Notes (Signed)
Date:  11/03/2022   Name:  Jill Dunlap   DOB:  [birth year]   MRN:  [id number]   Chief Complaint: Hypertension and Diabetes  Hypertension This is a chronic problem. The problem is controlled. Pertinent negatives include no chest pain, headaches, palpitations or shortness of breath. Past treatments include ACE inhibitors and diuretics. There is no history of kidney disease, CAD/MI or CVA.  Diabetes She presents for her follow-up diabetic visit. She has type 2 diabetes mellitus. Her disease course has been stable. Pertinent negatives for hypoglycemia include no dizziness, headaches or nervousness/anxiousness. Pertinent negatives for diabetes include no chest pain, no fatigue and no weakness. Pertinent negatives for diabetic complications include no CVA. Current diabetic treatment includes diet. An ACE inhibitor/angiotensin II receptor blocker is being taken.  Depression        This is a chronic problem.  The problem has been resolved since onset.  Associated symptoms include no fatigue, no myalgias and no headaches.  Past treatments include SSRIs - Selective serotonin reuptake inhibitors (only take Lexapro PRN -). Hyperlipidemia This is a chronic problem. The problem is controlled. Pertinent negatives include no chest pain, myalgias or shortness of breath. Current antihyperlipidemic treatment includes statins. The current treatment provides significant improvement of lipids.    Lab Results  Component Value Date   NA 136 07/03/2022   K 3.8 07/03/2022   CO2 24 07/03/2022   GLUCOSE 138 (H) 07/03/2022   BUN 21 07/03/2022   CREATININE 0.83 07/03/2022   CALCIUM 8.7 (L) 07/03/2022   EGFR 70 10/29/2021   GFRNONAA >60 07/03/2022   Lab Results  Component Value Date   CHOL 164 07/03/2022   HDL 54 07/03/2022   LDLCALC 94 07/03/2022   TRIG 79 07/03/2022   CHOLHDL 3.0 07/03/2022   Lab Results  Component Value Date   TSH 1.060 07/03/2022   Lab Results  Component Value Date    HGBA1C 7.0 (A) 11/03/2022   Lab Results  Component Value Date   WBC 8.5 07/03/2022   HGB 12.3 07/03/2022   HCT 36.6 07/03/2022   MCV 86.5 07/03/2022   PLT 245 07/03/2022   Lab Results  Component Value Date   ALT 17 07/03/2022   AST 24 07/03/2022   ALKPHOS 44 07/03/2022   BILITOT 0.7 07/03/2022   No results found for: "25OHVITD2", "25OHVITD3", "VD25OH"   Review of Systems  Constitutional:  Negative for fatigue and unexpected weight change.  HENT:  Negative for nosebleeds.   Eyes:  Negative for visual disturbance.  Respiratory:  Negative for cough, chest tightness, shortness of breath and wheezing.   Cardiovascular:  Negative for chest pain, palpitations and leg swelling.  Gastrointestinal:  Positive for diarrhea (occaional fecal soiling if she delays). Negative for abdominal pain and constipation.  Musculoskeletal:  Positive for arthralgias (shoulders). Negative for gait problem and myalgias.  Neurological:  Negative for dizziness, weakness, light-headedness and headaches.  Psychiatric/Behavioral:  Positive for depression. Negative for dysphoric mood and sleep disturbance. The patient is not nervous/anxious.     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   Left lumbar radiculopathy 03/13/2021   Piriformis syndrome, left 02/12/2021   Aortic atherosclerosis 08/01/2020   Shoulder impingement 02/02/2020   PMR (polymyalgia rheumatica) 02/02/2020   Primary osteoarthritis of left hip 01/24/2020   Diffuse photodamage of skin 02/13/2016  Basal cell carcinoma 01/11/2016   Gastroesophageal reflux disease without esophagitis 07/03/2015   Type II diabetes mellitus with complication (HCC) 03/15/2015   Acquired hypothyroidism 10/31/2014   Allergic rhinitis 10/31/2014   Benign hypertension 10/31/2014   CD (celiac  disease) 10/31/2014   Hyperlipidemia associated with type 2 diabetes mellitus 10/31/2014   OP (osteoporosis) 10/31/2014    Allergies  Allergen Reactions   Ciprofloxacin Hcl Rash    Past Surgical History:  Procedure Laterality Date   ABDOMINAL HYSTERECTOMY  1984   for bleeding   Basil cell     BILATERAL SALPINGOOPHORECTOMY  1984   CATARACT EXTRACTION W/PHACO Right 11/24/2016   Procedure: CATARACT EXTRACTION PHACO AND INTRAOCULAR LENS PLACEMENT (IOC) Right;  Surgeon: Lockie Mola, MD;  Location: [facility];  Service: Ophthalmology;  Laterality: Right;   CATARACT EXTRACTION W/PHACO Left 02/04/2017   Procedure: CATARACT EXTRACTION PHACO AND INTRAOCULAR LENS PLACEMENT (IOC)  Left;  Surgeon: Lockie Mola, MD;  Location: [facility];  Service: Ophthalmology;  Laterality: Left;   ORIF WRIST FRACTURE Left 2015   TYMPANOPLASTY  1992    Social History   Tobacco Use   Smoking status: Never   Smokeless tobacco: Never  Vaping Use   Vaping Use: Never used  Substance Use Topics   Alcohol use: Never   Drug use: Never     Medication list has been reviewed and updated.  Current Meds  Medication Sig   azelastine (ASTELIN) 0.1 % nasal spray INSTILL 2 SPRAYS IN EACH NOSTRIL TWICE DAILY AS DIRECTED   Biotin 2500 MCG CAPS Take 1 capsule by mouth daily.   calcium-vitamin D (OSCAL WITH D) 500-200 MG-UNIT tablet Take 1 tablet by mouth 2 (two) times daily.   celecoxib (CELEBREX) 50 MG capsule Take 1 capsule (50 mg total) by mouth daily as needed for pain.   Cranberry 500 MG CAPS Take 1 capsule by mouth daily.    escitalopram (LEXAPRO) 10 MG tablet TAKE (1) TABLET BY MOUTH EVERY DAY (Patient taking differently: as needed. TAKE (1) TABLET BY MOUTH EVERY DAY)   fexofenadine (ALLEGRA) 180 MG tablet Take 1 tablet by mouth daily.   fluticasone (FLONASE) 50 MCG/ACT nasal spray PLACE 2 SPRAYS INTO BOTH NOSTRILS DAILY.   levothyroxine (SYNTHROID) 50 MCG tablet TAKE (1)  TABLET BY MOUTH EVERY DAY BEFORE BREAKFAST   lisinopril-hydrochlorothiazide (ZESTORETIC) 20-12.5 MG tablet TAKE (1) TABLET BY MOUTH EVERY DAY   Multiple Vitamins-Minerals (CENTRUM SILVER PO) Take 1 tablet by mouth daily.   omeprazole (PRILOSEC) 20 MG capsule TAKE (1) CAPSULE BY MOUTH EVERY DAY   simvastatin (ZOCOR) 20 MG tablet TAKE (1) TABLET BY MOUTH EVERY DAY AT 6 IN THE EVENING   TURMERIC PO Take 1 tablet by mouth daily.        11/03/2022   10:25 AM 08/11/2022   11:18 AM 07/03/2022   10:23 AM 02/05/2022   11:00 AM  GAD 7 : Generalized Anxiety Score  Nervous, Anxious, on Edge 0 0 0 0  Control/stop worrying 0 0 0 0  Worry too much - different things 0 0 0 0  Trouble relaxing 0 0 0 0  Restless 0 0 0 0  Easily annoyed or irritable 0 0 0 0  Afraid - awful might happen 0 0 0 0  Total GAD 7 Score 0 0 0 0  Anxiety Difficulty Not difficult at all Not difficult at all Not difficult at all        11/03/2022   10:25 AM 08/11/2022  11:18 AM 07/03/2022   10:22 AM  Depression screen PHQ 2/9  Decreased Interest 0 0 0  Down, Depressed, Hopeless 0 0 0  PHQ - 2 Score 0 0 0  Altered sleeping 0 0 0  Tired, decreased energy 0 0 2  Change in appetite 0 0 0  Feeling bad or failure about yourself  0 0 0  Trouble concentrating 0 0 0  Moving slowly or fidgety/restless 0 0 0  Suicidal thoughts 0 0 0  PHQ-9 Score 0 0 2  Difficult doing work/chores Not difficult at all Not difficult at all Not difficult at all    BP Readings from Last 3 Encounters:  11/03/22 116/74  10/02/22 108/68  08/14/22 120/60    Physical Exam Vitals and nursing note reviewed.  Constitutional:      General: She is not in acute distress.    Appearance: She is well-developed.  HENT:     Head: Normocephalic and atraumatic.  Cardiovascular:     Rate and Rhythm: Normal rate and regular rhythm.  Pulmonary:     Effort: Pulmonary effort is normal. No respiratory distress.     Breath sounds: No wheezing or rhonchi.   Musculoskeletal:        General: Normal range of motion.     Cervical back: Normal range of motion.     Right lower leg: No edema.     Left lower leg: No edema.  Skin:    General: Skin is warm and dry.     Capillary Refill: Capillary refill takes less than 2 seconds.     Findings: No rash.  Neurological:     General: No focal deficit present.     Mental Status: She is alert and oriented to person, place, and time. Mental status is at baseline.  Psychiatric:        Mood and Affect: Mood normal.        Behavior: Behavior normal.    Diabetic Foot Exam - Simple   Simple Foot Form Diabetic Foot exam was performed with the following findings: Yes 11/03/2022 10:48 AM  Visual Inspection No deformities, no ulcerations, no other skin breakdown bilaterally: Yes Sensation Testing Intact to touch and monofilament testing bilaterally: Yes Pulse Check Posterior Tibialis and Dorsalis pulse intact bilaterally: Yes Comments      Wt Readings from Last 3 Encounters:  11/03/22 157 lb (71.2 kg)  10/02/22 159 lb (72.1 kg)  08/14/22 162 lb (73.5 kg)    BP 116/74   Pulse 75   Ht  (1.651 m)   Wt 157 lb (71.2 kg)   SpO2 98%   BMI 26.13 kg/m   Assessment and Plan:  Problem List Items Addressed This Visit       Cardiovascular and Mediastinum   Aortic atherosclerosis    On appropriate statin therapy      Benign hypertension - Primary (Chronic)    Clinically stable exam with well controlled BP on lisinopril and hctz. Tolerating medications without side effects. Pt to continue current regimen and low sodium diet.         Digestive   CD (celiac disease)    Occasional fecal soiling controlled with PRN imodium On a gluten free diet May take imodium daily - follow up if worsening        Endocrine   Hyperlipidemia associated with type 2 diabetes mellitus (Chronic)    On statin therapy without side effects  Lab Results  Component Value Date   LDLCALC 94  07/03/2022         Type II diabetes mellitus with complication (HCC) (Chronic)    Clinically stable without s/s of hypoglycemia. Diet controlled. Lab Results  Component Value Date   HGBA1C 7.2 (H) 07/03/2022  A1C today = 7.0       Relevant Orders   POCT glycosylated hemoglobin (Hb A1C) (Completed)     Other   Current moderate episode of major depressive disorder without prior episode    Much improved with minimal symptoms She is taking Lexapro PRN - can likely stop completely      PMR (polymyalgia rheumatica) (Chronic)    Onset 12/2019 with eventual tapering off steroids 03/2022 No symptoms to suggest recurrence Will continue to monitor       Return in about 4 months (around 03/05/2023) for DM, HTN.   Partially dictated using Dragon software, any errors are not intentional.  Reubin Milan, MD [facility] Primary Care and Sports Medicine [place], [place]

## 2022-11-03 NOTE — Assessment & Plan Note (Addendum)
Clinically stable without s/s of hypoglycemia. Diet controlled. Lab Results  Component Value Date   HGBA1C 7.2 (H) 07/03/2022  A1C today = 7.0

## 2022-11-03 NOTE — Assessment & Plan Note (Signed)
Occasional fecal soiling controlled with PRN imodium On a gluten free diet May take imodium daily - follow up if worsening

## 2022-11-06 ENCOUNTER — Ambulatory Visit: Payer: Medicare PPO | Admitting: Family Medicine

## 2022-11-11 ENCOUNTER — Ambulatory Visit: Payer: Medicare PPO | Admitting: Family Medicine

## 2022-11-11 ENCOUNTER — Other Ambulatory Visit (INDEPENDENT_AMBULATORY_CARE_PROVIDER_SITE_OTHER): Payer: Medicare PPO | Admitting: Radiology

## 2022-11-11 ENCOUNTER — Encounter: Payer: Self-pay | Admitting: Family Medicine

## 2022-11-11 VITALS — BP 110/78 | HR 72 | Ht 65.0 in | Wt 161.0 lb

## 2022-11-11 DIAGNOSIS — M25812 Other specified joint disorders, left shoulder: Secondary | ICD-10-CM | POA: Diagnosis not present

## 2022-11-11 MED ORDER — TRIAMCINOLONE ACETONIDE 40 MG/ML IJ SUSP
40.0000 mg | Freq: Once | INTRAMUSCULAR | Status: AC
  Administered 2022-11-11: 40 mg via INTRAMUSCULAR

## 2022-11-11 NOTE — Progress Notes (Signed)
     Primary Care / Sports Medicine Office Visit  Patient Information:  Patient ID: Jill Dunlap, female DOB: Mar 17, 1935 Age: 88 y.o. MRN: [id number]   Jill Dunlap is a pleasant 88 y.o. female presenting with the following:  Chief Complaint  Patient presents with   Shoulder impingement    Left, movement hurts    Vitals:   11/11/22 1041  BP: 110/78  Pulse: 72  SpO2: 98%   Vitals:   11/11/22 1041  Weight: 161 lb (73 kg)  Height:  (1.651 m)   Body mass index is 26.79 kg/m.  No results found.   Independent interpretation of notes and tests performed by another provider:   None  Procedures performed:   Procedure:  Injection of left subacromial shoulder under ultrasound guidance. Ultrasound guidance utilized for in-plane approach, visualized rotator cuff tendon but sonographic irregularity is consistent with tendinopathy Samsung HS60 device utilized with permanent recording / reporting. Verbal informed consent obtained and verified. Skin prepped in a sterile fashion. Ethyl chloride for topical local analgesia.  Completed without difficulty and tolerated well. Medication: triamcinolone acetonide 40 mg/mL suspension for injection 1 mL total and 3 mL lidocaine 1% without epinephrine utilized for needle placement anesthetic Advised to contact for fevers/chills, erythema, induration, drainage, or persistent bleeding.   Pertinent History, Exam, Impression, and Recommendations:   Jill Dunlap was seen today for shoulder impingement.  Impingement of left shoulder Assessment & Plan: Patient has noted stable symptoms over the past month however increasingly limited range of motion and pain with ADLs.  Examination does demonstrate limitations in all directions due to pain as a confounding element, RC testing preserved, painful, positive impingement.  Patient did elect to proceed with ultrasound-guided subacromial corticosteroid injection.  She was noted to have  increased, though not full, range of motion following injection.  Elements of adhesive capsulitis cannot be excluded given findings today.  - Subacromial ultrasound-guided cortisone injection - Start home-based rehab with focus on range of motion - Follow-up as needed - Can consider shoulder dilatation procedure  Orders: -     [place] LIMITED JOINT SPACE STRUCTURES UP LEFT; Future     Orders & Medications No orders of the defined types were placed in this encounter.  Orders Placed This Encounter  Procedures   [place] LIMITED JOINT SPACE STRUCTURES UP LEFT     No follow-ups on file.     Jerrol Banana, MD, [facility]   Primary Care Sports Medicine Primary Care and Sports Medicine at [facility]

## 2022-11-11 NOTE — Assessment & Plan Note (Signed)
Patient has noted stable symptoms over the past month however increasingly limited range of motion and pain with ADLs.  Examination does demonstrate limitations in all directions due to pain as a confounding element, RC testing preserved, painful, positive impingement.  Patient did elect to proceed with ultrasound-guided subacromial corticosteroid injection.  She was noted to have increased, though not full, range of motion following injection.  Elements of adhesive capsulitis cannot be excluded given findings today.  - Subacromial ultrasound-guided cortisone injection - Start home-based rehab with focus on range of motion - Follow-up as needed - Can consider shoulder dilatation procedure

## 2022-11-11 NOTE — Patient Instructions (Signed)
You have just been given a cortisone injection to reduce pain and inflammation. After the injection you may notice immediate relief of pain as a result of the Lidocaine. It is important to rest the area of the injection for 24 to 48 hours after the injection. There is a possibility of some temporary increased discomfort and swelling for up to 72 hours until the cortisone begins to work. If you do have pain, simply rest the joint and use ice. If you can tolerate over the counter medications, you can try Tylenol, Aleve, or Advil for added relief per package instructions. - Start home exercises with a focus on attaining maximum painless range of motion - Follow-up as-needed

## 2022-11-26 ENCOUNTER — Ambulatory Visit
Admission: RE | Admit: 2022-11-26 | Discharge: 2022-11-26 | Disposition: A | Discharge status: Home / Self Care | Payer: Medicare PPO | Source: Ambulatory Visit | Attending: Internal Medicine | Admitting: Internal Medicine

## 2022-11-26 DIAGNOSIS — Z1231 Encounter for screening mammogram for malignant neoplasm of breast: Secondary | ICD-10-CM | POA: Insufficient documentation

## 2022-11-29 ENCOUNTER — Other Ambulatory Visit: Payer: Self-pay | Admitting: Internal Medicine

## 2022-11-29 DIAGNOSIS — E785 Hyperlipidemia, unspecified: Secondary | ICD-10-CM

## 2022-12-01 NOTE — Telephone Encounter (Signed)
Requested Prescriptions  Pending Prescriptions Disp Refills   simvastatin (ZOCOR) 20 MG tablet [Pharmacy Med Name: SIMVASTATIN TAB 20MG ] 90 tablet 0    Sig: TAKE (1) TABLET BY MOUTH EVERY DAY AT 6 IN THE EVENING     Cardiovascular:  Antilipid - Statins Failed - 11/29/2022  9:27 AM      Failed - Lipid Panel in normal range within the last 12 months    Cholesterol, Total  Date Value Ref Range Status  11/22/2020 175 100 - 199 mg/dL Final   Cholesterol  Date Value Ref Range Status  07/03/2022 164 0 - 200 mg/dL Final   LDL Chol Calc (NIH)  Date Value Ref Range Status  11/22/2020 84 0 - 99 mg/dL Final   LDL Cholesterol  Date Value Ref Range Status  07/03/2022 94 0 - 99 mg/dL Final    Comment:           Total Cholesterol/HDL:CHD Risk Coronary Heart Disease Risk Table                     Men   Women  1/2 Average Risk   3.4   3.3  Average Risk       5.0   4.4  2 X Average Risk   9.6   7.1  3 X Average Risk  23.4   11.0        Use the calculated Patient Ratio above and the CHD Risk Table to determine the patient's CHD Risk.        ATP III CLASSIFICATION (LDL):  <100     mg/dL   Optimal  161-096  mg/dL   Near or Above                    Optimal  130-159  mg/dL   Borderline  045-409  mg/dL   High  >811     mg/dL   Very High Performed at [facility], [address] [place], [place] [address]    HDL  Date Value Ref Range Status  07/03/2022 54 >40 mg/dL Final  29/56/2130 75 >86 mg/dL Final   Triglycerides  Date Value Ref Range Status  07/03/2022 79 <150 mg/dL Final         Passed - Patient is not pregnant      Passed - Valid encounter within last 12 months    Recent Outpatient Visits           2 weeks ago Impingement of left shoulder   [place] [facility] at [facility], Ocie Bob, MD   4 weeks ago Benign hypertension   [place] [facility] at [facility] [place] [facility], Nyoka Cowden, MD   2 months ago  Shoulder impingement   [place] [facility] at [facility], Ocie Bob, MD   3 months ago Shoulder impingement   [facility] Medicine at [facility], Ocie Bob, MD   3 months ago PMR (polym[facility]algia rheumatica) [facility]   [place] [facility] at [facility], Nyoka Cowden, MD       Future Appointments             In 3 months Judithann Graves, Nyoka Cowden, MD [facility] Primary Care & Sports Medicine at [facility], [facility]   In 7 months Judithann Graves, Nyoka Cowden, MD [facility] Primary Care & Sports Medicine at [facility]  [place], [facility]

## 2022-12-13 ENCOUNTER — Ambulatory Visit
Admission: EM | Admit: 2022-12-13 | Discharge: 2022-12-13 | Disposition: A | Discharge status: Home / Self Care | Payer: Medicare PPO | Attending: Physician Assistant | Admitting: Physician Assistant

## 2022-12-13 ENCOUNTER — Encounter: Payer: Self-pay | Admitting: Emergency Medicine

## 2022-12-13 DIAGNOSIS — J209 Acute bronchitis, unspecified: Secondary | ICD-10-CM

## 2022-12-13 DIAGNOSIS — R051 Acute cough: Secondary | ICD-10-CM

## 2022-12-13 MED ORDER — AMOXICILLIN-POT CLAVULANATE 875-125 MG PO TABS: 1.0000 | ORAL_TABLET | Freq: Two times a day (BID) | ORAL | 0 refills | Status: AC

## 2022-12-13 MED ORDER — BENZONATATE 200 MG PO CAPS: 200.0000 mg | ORAL_CAPSULE | Freq: Three times a day (TID) | ORAL | 0 refills | Status: DC | PRN

## 2022-12-13 MED ORDER — PROMETHAZINE-DM 6.25-15 MG/5ML PO SYRP: 5.0000 mL | ORAL_SOLUTION | Freq: Two times a day (BID) | ORAL | 0 refills | Status: DC | PRN

## 2022-12-13 NOTE — ED Provider Notes (Signed)
[facility]    CSN: [id number] Arrival date & time: 12/13/22  1106      History   Chief Complaint Chief Complaint  Patient presents with   Cough    HPI Jill Dunlap is a 88 y.o. female presenting for 2-week history of cough and congestion.  Reports a lot of nasal drainage and sinus pressure.  Reports that she gets into coughing fits that sometimes take her breath away.  She denies fever, sore throat, chest pain.  Has tried Mucinex and Coricidin HBP over-the-counter without relief.  Thinks the cough may be getting worse.  Family history is significant for hyperlipidemia, celiac disease, hypertension, type 2 diabetes, polymyalgia rheumatica.  HPI  Past Medical History:  Diagnosis Date   Acid reflux    Allergy    Cancer (HCC)    skin ca on leg head and back   Celiac disease    Current moderate episode of major depressive disorder without prior episode (HCC)    Gastroesophageal reflux disease without esophagitis    Hyperlipidemia    Hypertension    Hypothyroidism    OP (osteoporosis)    Piriformis syndrome, left 02/12/2021   Pre-diabetes    Primary osteoarthritis of left hip    Type II diabetes mellitus with complication (HCC)    Vertigo    no episodes in several years   Wears dentures    partial lower    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   Left lumbar radiculopathy 03/13/2021   Piriformis syndrome, left 02/12/2021   Aortic atherosclerosis (HCC) 08/01/2020   Shoulder impingement 02/02/2020   PMR (polymyalgia rheumatica) (HCC) 02/02/2020   Primary osteoarthritis of left hip 01/24/2020   Diffuse photodamage of skin 02/13/2016   Basal cell carcinoma 01/11/2016   Gastroesophageal reflux disease without esophagitis 07/03/2015   Type II diabetes mellitus with  complication (HCC) 03/15/2015   Acquired hypothyroidism 10/31/2014   Allergic rhinitis 10/31/2014   Benign hypertension 10/31/2014   CD (celiac disease) 10/31/2014   Hyperlipidemia associated with type 2 diabetes mellitus (HCC) 10/31/2014   OP (osteoporosis) 10/31/2014    Past Surgical History:  Procedure Laterality Date   ABDOMINAL HYSTERECTOMY  1984   for bleeding   Basil cell     BILATERAL SALPINGOOPHORECTOMY  1984   CATARACT EXTRACTION W/PHACO Right 11/24/2016   Procedure: CATARACT EXTRACTION PHACO AND INTRAOCULAR LENS PLACEMENT (IOC) Right;  Surgeon: Lockie Mola, MD;  Location: [facility];  Service: Ophthalmology;  Laterality: Right;   CATARACT EXTRACTION W/PHACO Left 02/04/2017   Procedure: CATARACT EXTRACTION PHACO AND INTRAOCULAR LENS PLACEMENT (IOC)  Left;  Surgeon: Lockie Mola, MD;  Location: [facility];  Service: Ophthalmology;  Laterality: Left;   ORIF WRIST FRACTURE Left 2015   TYMPANOPLASTY  1992    OB History   No obstetric history on file.      Home Medications    Prior to Admission medications   Medication Sig Start Date End Date Taking? Authorizing Provider  amoxicillin-clavulanate (AUGMENTIN) 875-125 MG tablet Take 1 tablet by mouth every 12 (twelve) hours for 7 days. 12/13/22 12/20/22 Yes Shirlee Latch, PA-C  benzonatate (TESSALON) 200 MG capsule Take 1 capsule (200 mg total) by mouth 3 (three) times daily as needed for cough. 12/13/22  Yes Shirlee Latch, PA-C  promethazine-dextromethorphan (PROMETHAZINE-DM) 6.25-15 MG/5ML  syrup Take 5 mLs by mouth 2 (two) times daily as needed for cough. 12/13/22  Yes Eusebio Friendly B, PA-C  azelastine (ASTELIN) 0.1 % nasal spray INSTILL 2 SPRAYS IN EACH NOSTRIL TWICE DAILY AS DIRECTED 10/25/18   Reubin Milan, MD  Biotin 2500 MCG CAPS Take 1 capsule by mouth daily.    [provider]  calcium-vitamin D (OSCAL WITH D) 500-200 MG-UNIT tablet Take 1 tablet by mouth 2 (two) times daily.     [provider]  celecoxib (CELEBREX) 50 MG capsule Take 1 capsule (50 mg total) by mouth daily as needed for pain. 10/02/22   Jerrol Banana, MD  Cranberry 500 MG CAPS Take 1 capsule by mouth daily.     [provider]  escitalopram (LEXAPRO) 10 MG tablet TAKE (1) TABLET BY MOUTH EVERY DAY Patient taking differently: as needed. TAKE (1) TABLET BY MOUTH EVERY DAY 08/15/22   Reubin Milan, MD  fexofenadine (ALLEGRA) 180 MG tablet Take 1 tablet by mouth daily.    [provider]  fluticasone (FLONASE) 50 MCG/ACT nasal spray PLACE 2 SPRAYS INTO BOTH NOSTRILS DAILY. 08/11/22   Reubin Milan, MD  levothyroxine (SYNTHROID) 50 MCG tablet TAKE (1) TABLET BY MOUTH EVERY DAY BEFORE BREAKFAST 08/29/22   Reubin Milan, MD  lisinopril-hydrochlorothiazide (ZESTORETIC) 20-12.5 MG tablet TAKE (1) TABLET BY MOUTH EVERY DAY 08/29/22   Reubin Milan, MD  Multiple Vitamins-Minerals (CENTRUM SILVER PO) Take 1 tablet by mouth daily.    [provider]  omeprazole (PRILOSEC) 20 MG capsule TAKE (1) CAPSULE BY MOUTH EVERY DAY 07/18/22   Reubin Milan, MD  simvastatin (ZOCOR) 20 MG tablet TAKE (1) TABLET BY MOUTH EVERY DAY AT 6 IN THE EVENING 12/01/22   Reubin Milan, MD  TURMERIC PO Take 1 tablet by mouth daily.     [provider]  Omeprazole (PRILOSEC PO) Take 1 tablet by mouth.  01/24/20  [provider]    Family History Family History  Problem Relation Age of Onset   Cancer Mother        oral   Hypertension Father    Heart disease Father    Breast cancer Neg Hx     Social History Social History   Tobacco Use   Smoking status: Never   Smokeless tobacco: Never  Vaping Use   Vaping Use: Never used  Substance Use Topics   Alcohol use: Never   Drug use: Never     Allergies   Ciprofloxacin hcl   Review of Systems Review of Systems  Constitutional:  Positive for fatigue. Negative for chills, diaphoresis and fever.  HENT:   Positive for congestion, rhinorrhea and sinus pressure. Negative for ear pain, sinus pain and sore throat.   Respiratory:  Positive for cough and shortness of breath.   Cardiovascular:  Negative for chest pain.  Gastrointestinal:  Negative for abdominal pain, nausea and vomiting.  Musculoskeletal:  Negative for arthralgias and myalgias.  Skin:  Negative for rash.  Neurological:  Negative for weakness and headaches.  Hematological:  Negative for adenopathy.     Physical Exam Triage Vital Signs ED Triage Vitals [12/13/22 1112]  Enc Vitals Group     BP      Pulse      Resp      Temp      Temp src      SpO2      Weight 160 lb (72.6 kg)     Height  5\' 5"  (1.651 m)     Head Circumference      Peak Flow      Pain Score 0     Pain Loc      Pain Edu?      Excl. in GC?    No data found.  Updated Vital Signs BP (!) 157/80 (BP Location: Left Arm)   Temp 98.6 F (37 C) (Oral)   Resp 14   Ht 5\' 5"  (1.651 m)   Wt 160 lb (72.6 kg)   SpO2 96%   BMI 26.63 kg/m     Physical Exam Vitals and nursing note reviewed.  Constitutional:      General: She is not in acute distress.    Appearance: Normal appearance. She is not ill-appearing or toxic-appearing.  HENT:     Head: Normocephalic and atraumatic.     Right Ear: Tympanic membrane, ear canal and external ear normal.     Left Ear: Tympanic membrane, ear canal and external ear normal.     Nose: Congestion present.     Mouth/Throat:     Mouth: Mucous membranes are moist.     Pharynx: Oropharynx is clear.  Eyes:     General: No scleral icterus.       Right eye: No discharge.        Left eye: No discharge.     Conjunctiva/sclera: Conjunctivae normal.  Cardiovascular:     Rate and Rhythm: Normal rate and regular rhythm.     Heart sounds: Normal heart sounds.  Pulmonary:     Effort: Pulmonary effort is normal. No respiratory distress.     Breath sounds: Rhonchi (few scattered rhonchi upper lung fields) present.  Musculoskeletal:      Cervical back: Neck supple.  Skin:    General: Skin is dry.  Neurological:     General: No focal deficit present.     Mental Status: She is alert. Mental status is at baseline.     Motor: No weakness.     Gait: Gait normal.  Psychiatric:        Mood and Affect: Mood normal.        Behavior: Behavior normal.        Thought Content: Thought content normal.      UC Treatments / Results  Labs (all labs ordered are listed, but only abnormal results are displayed) Labs Reviewed - No data to display  EKG   Radiology No results found.  Procedures Procedures (including critical care time)  Medications Ordered in UC Medications - No data to display  Initial Impression / Assessment and Plan / UC Course  I have reviewed the triage vital signs and the nursing notes.  Pertinent labs & imaging results that were available during my care of the patient were reviewed by me and considered in my medical decision making (see chart for details).   88 year old female presents for 2-week history of cough, congestion, sinus pressure, coughing fits that cause shortness of breath.  She is afebrile and overall well-appearing.  She does cough frequently during exam.  On exam she has nasal congestion.  Throat clear.  Few scattered rhonchi of upper lung fields.  Acute bronchitis.  Since symptoms have been ongoing and worsening x 2 weeks, will cover at this time with Augmentin for potential bacterial infection.  Also sent Promethazine DM and benzonatate.  Encouraged increasing rest and fluids.  Be returning for fever, worsening cough or increased shortness of breath.   Final Clinical Impressions(s) /  UC Diagnoses   Final diagnoses:  Acute bronchitis, unspecified organism  Acute cough     Discharge Instructions      -You have bronchitis.  This last longer than a simple head cold.  I sent antibiotics and cough medicine to pharmacy.  If the Promethazine DM cough medication makes you too  drowsy images taken in the evening or switch back to Coricidin. - If you develop a fever or have increased shortness of breath or weakness, please return for reevaluation.     ED Prescriptions     Medication Sig Dispense Auth. Provider   amoxicillin-clavulanate (AUGMENTIN) 875-125 MG tablet Take 1 tablet by mouth every 12 (twelve) hours for 7 days. 14 tablet Shirlee Latch, PA-C   promethazine-dextromethorphan (PROMETHAZINE-DM) 6.25-15 MG/5ML syrup Take 5 mLs by mouth 2 (two) times daily as needed for cough. 118 mL Eusebio Friendly B, PA-C   benzonatate (TESSALON) 200 MG capsule Take 1 capsule (200 mg total) by mouth 3 (three) times daily as needed for cough. 30 capsule Shirlee Latch, PA-C      PDMP not reviewed this encounter.   Shirlee Latch, PA-C 12/13/22 1150

## 2022-12-13 NOTE — ED Triage Notes (Signed)
Patient c/o cough and chest congestion and runny nose for over a week.  Patient tried OTC cold medicine but has no relief.  Patient denies fevers.

## 2022-12-13 NOTE — Discharge Instructions (Addendum)
-  You have bronchitis.  This last longer than a simple head cold.  I sent antibiotics and cough medicine to pharmacy.  If the Promethazine DM cough medication makes you too drowsy images taken in the evening or switch back to Coricidin. - If you develop a fever or have increased shortness of breath or weakness, please return for reevaluation.

## 2022-12-26 ENCOUNTER — Encounter: Payer: Self-pay | Admitting: Internal Medicine

## 2022-12-26 ENCOUNTER — Other Ambulatory Visit: Payer: Self-pay

## 2022-12-26 ENCOUNTER — Other Ambulatory Visit: Payer: Self-pay | Admitting: Internal Medicine

## 2022-12-26 DIAGNOSIS — B356 Tinea cruris: Secondary | ICD-10-CM

## 2022-12-26 MED ORDER — NYSTATIN-TRIAMCINOLONE 100000-0.1 UNIT/GM-% EX OINT: 1.0000 | TOPICAL_OINTMENT | Freq: Two times a day (BID) | CUTANEOUS | 0 refills | Status: DC

## 2023-01-10 ENCOUNTER — Other Ambulatory Visit: Payer: Self-pay | Admitting: Internal Medicine

## 2023-01-10 DIAGNOSIS — K219 Gastro-esophageal reflux disease without esophagitis: Secondary | ICD-10-CM

## 2023-01-26 DIAGNOSIS — E119 Type 2 diabetes mellitus without complications: Secondary | ICD-10-CM | POA: Diagnosis not present

## 2023-01-26 DIAGNOSIS — H02831 Dermatochalasis of right upper eyelid: Secondary | ICD-10-CM | POA: Diagnosis not present

## 2023-01-26 DIAGNOSIS — H26493 Other secondary cataract, bilateral: Secondary | ICD-10-CM | POA: Diagnosis not present

## 2023-01-26 LAB — HM DIABETES EYE EXAM

## 2023-01-27 ENCOUNTER — Encounter: Payer: Self-pay | Admitting: Internal Medicine

## 2023-02-09 ENCOUNTER — Other Ambulatory Visit: Payer: Self-pay | Admitting: Internal Medicine

## 2023-02-09 DIAGNOSIS — F321 Major depressive disorder, single episode, moderate: Secondary | ICD-10-CM

## 2023-02-21 ENCOUNTER — Other Ambulatory Visit: Payer: Self-pay | Admitting: Internal Medicine

## 2023-02-21 DIAGNOSIS — E039 Hypothyroidism, unspecified: Secondary | ICD-10-CM

## 2023-02-21 DIAGNOSIS — I10 Essential (primary) hypertension: Secondary | ICD-10-CM

## 2023-02-23 NOTE — Telephone Encounter (Signed)
Requested Prescriptions  Pending Prescriptions Disp Refills   levothyroxine (SYNTHROID) 50 MCG tablet [Pharmacy Med Name: LEVOTHYROXIN TAB 50MCG] 90 tablet 1    Sig: TAKE (1) TABLET BY MOUTH EVERY DAY BEFORE BREAKFAST     Endocrinology:  Hypothyroid Agents Passed - 02/21/2023 11:45 AM      Passed - TSH in normal range and within 360 days    TSH  Date Value Ref Range Status  07/03/2022 1.060 0.350 - 4.500 uIU/mL Final    Comment:    Performed by a 3rd Generation assay with a functional sensitivity of <=0.01 uIU/mL. Performed at [facility], [address] [place], [place] [address]   11/22/2020 2.180 0.450 - 4.500 uIU/mL Final         Passed - Valid encounter within last 12 months    Recent Outpatient Visits           3 months ago Impingement of left shoulder   [place] [facility], Ocie Bob, MD   3 months ago Benign hypertension   [place] [facility] at [facility], Nyoka Cowden, MD   4 months ago Shoulder impingement   [place] [facility] at [facility], Ocie Bob, MD   6 months ago Shoulder impingement   [place] [facility] at [facility], Ocie Bob, MD   6 months ago PMR (polymyalgia rheumatica) [facility])    [facility] Medicine at [facility] Nyoka Cowden, MD       Future Appointments             In 1 week Reubin Milan, MD [facility] Primary Care & [facility] Medicine at [facility], [facility]   In 4 months Reubin Milan, MD [facility] Care & [facility] Medicine at [facility], [facility]             lisinopril-hydrochlorothiazide (ZESTORETIC) 20-12.5 MG tablet [Pharmacy Med Name: LISINOP/HCTZ TAB 20-12.5] 90 tablet 0    Sig: TAKE (1) TABLET BY MOUTH EVERY DAY     Cardiovascular:  ACEI + Diuretic Combos Failed - 02/21/2023 11:45 AM      Failed - Na in normal range and  within 180 days    Sodium  Date Value Ref Range Status  07/03/2022 136 135 - 145 mmol/L Final  10/29/2021 143 134 - 144 mmol/L Final         Failed - K in normal range and within 180 days    Potassium  Date Value Ref Range Status  07/03/2022 3.8 3.5 - 5.1 mmol/L Final         Failed - Cr in normal range and within 180 days    Creatinine, Ser  Date Value Ref Range Status  07/03/2022 0.83 0.44 - 1.00 mg/dL Final         Failed - eGFR is 30 or above and within 180 days    GFR calc Af Amer  Date Value Ref Range Status  01/27/2020 87 >59 mL/min/1.73 Final    Comment:    **[facility] currently reports eGFR in compliance with the current**   recommendations of the [employer]. [facility] will   update reporting as new guidelines are published from the NKF-ASN   Task force.    GFR, Estimated  Date Value Ref Range Status  07/03/2022 >60 >60 mL/min Final    Comment:    (  NOTE) Calculated using the CKD-EPI Creatinine Equation (2021)    eGFR  Date Value Ref Range Status  10/29/2021 70 >59 mL/min/1.73 Final         Failed - Last BP in normal range    BP Readings from Last 1 Encounters:  12/13/22 (!) 157/80         Passed - Patient is not pregnant      Passed - Valid encounter within last 6 months    Recent Outpatient Visits           3 months ago Impingement of left shoulder   [place] [facility], Ocie Bob, MD   3 months ago Benign hypertension   [place] [facility] at [facility], Nyoka Cowden, MD   4 months ago Shoulder impingement   [place] [facility] at [facility], Ocie Bob, MD   6 months ago Shoulder impingement   [facility] at [facility], Ocie Bob, MD   6 months ago PMR (polymyalgia rheumatica) [facility])   [place] [facility] at [facility], Nyoka Cowden, MD        Future Appointments             In 1 week Judithann Graves, Nyoka Cowden, MD [facility] Primary Care & Sports Medicine at [facility], [facility]   In 4 months Judithann Graves, Nyoka Cowden, MD [facility] Primary Care & Sports Medicine at [facility], [facility]

## 2023-02-27 ENCOUNTER — Other Ambulatory Visit: Payer: Self-pay | Admitting: Internal Medicine

## 2023-02-27 DIAGNOSIS — E785 Hyperlipidemia, unspecified: Secondary | ICD-10-CM

## 2023-03-05 DIAGNOSIS — L820 Inflamed seborrheic keratosis: Secondary | ICD-10-CM | POA: Diagnosis not present

## 2023-03-05 DIAGNOSIS — Z85828 Personal history of other malignant neoplasm of skin: Secondary | ICD-10-CM | POA: Diagnosis not present

## 2023-03-05 DIAGNOSIS — D2261 Melanocytic nevi of right upper limb, including shoulder: Secondary | ICD-10-CM | POA: Diagnosis not present

## 2023-03-05 DIAGNOSIS — D2271 Melanocytic nevi of right lower limb, including hip: Secondary | ICD-10-CM | POA: Diagnosis not present

## 2023-03-05 DIAGNOSIS — L538 Other specified erythematous conditions: Secondary | ICD-10-CM | POA: Diagnosis not present

## 2023-03-05 DIAGNOSIS — C4441 Basal cell carcinoma of skin of scalp and neck: Secondary | ICD-10-CM | POA: Diagnosis not present

## 2023-03-05 DIAGNOSIS — D2262 Melanocytic nevi of left upper limb, including shoulder: Secondary | ICD-10-CM | POA: Diagnosis not present

## 2023-03-05 DIAGNOSIS — D3701 Neoplasm of uncertain behavior of lip: Secondary | ICD-10-CM | POA: Diagnosis not present

## 2023-03-05 DIAGNOSIS — L720 Epidermal cyst: Secondary | ICD-10-CM | POA: Diagnosis not present

## 2023-03-05 DIAGNOSIS — D485 Neoplasm of uncertain behavior of skin: Secondary | ICD-10-CM | POA: Diagnosis not present

## 2023-03-05 DIAGNOSIS — D225 Melanocytic nevi of trunk: Secondary | ICD-10-CM | POA: Diagnosis not present

## 2023-03-06 ENCOUNTER — Encounter: Payer: Self-pay | Admitting: Internal Medicine

## 2023-03-06 ENCOUNTER — Ambulatory Visit: Payer: Medicare PPO | Admitting: Internal Medicine

## 2023-03-06 VITALS — BP 130/78 | HR 75 | Ht 65.0 in | Wt 159.0 lb

## 2023-03-06 DIAGNOSIS — J309 Allergic rhinitis, unspecified: Secondary | ICD-10-CM

## 2023-03-06 DIAGNOSIS — M353 Polymyalgia rheumatica: Secondary | ICD-10-CM

## 2023-03-06 DIAGNOSIS — I10 Essential (primary) hypertension: Secondary | ICD-10-CM | POA: Diagnosis not present

## 2023-03-06 DIAGNOSIS — E118 Type 2 diabetes mellitus with unspecified complications: Secondary | ICD-10-CM

## 2023-03-06 DIAGNOSIS — K219 Gastro-esophageal reflux disease without esophagitis: Secondary | ICD-10-CM | POA: Diagnosis not present

## 2023-03-06 MED ORDER — FLUTICASONE PROPIONATE 50 MCG/ACT NA SUSP: 2.0000 | Freq: Every day | NASAL | 0 refills | Status: DC

## 2023-03-06 NOTE — Assessment & Plan Note (Signed)
Some hand stiffness but no proximal muscle pain or weakness to suggest flare Continue to monitor - ESR today

## 2023-03-06 NOTE — Assessment & Plan Note (Addendum)
One episode of severe heartburn despite omeprazole. Took TUMS and symptoms have not returned If symptoms recur, may need to adjust daily therapy

## 2023-03-06 NOTE — Progress Notes (Signed)
Date:  03/06/2023   Name:  Jill Dunlap   DOB:  30-Sep-1934   MRN:  [id number]   Chief Complaint: Diabetes and Hypertension  Diabetes She presents for her follow-up diabetic visit. She has type 2 diabetes mellitus. Her disease course has been stable. Pertinent negatives for hypoglycemia include no dizziness or headaches. Pertinent negatives for diabetes include no chest pain, no fatigue and no weakness. Current diabetic treatment includes diet.  Hypertension This is a chronic problem. The problem is controlled. Associated symptoms include shortness of breath (with exertion unchanged). Pertinent negatives include no chest pain, headaches or palpitations. Past treatments include ACE inhibitors and diuretics.  PMR - completed steroid treatment as of 03/2022.  Remains asymptomatic at this time.   Lab Results  Component Value Date   NA 136 07/03/2022   K 3.8 07/03/2022   CO2 24 07/03/2022   GLUCOSE 138 (H) 07/03/2022   BUN 21 07/03/2022   CREATININE 0.83 07/03/2022   CALCIUM 8.7 (L) 07/03/2022   EGFR 70 10/29/2021   GFRNONAA >60 07/03/2022   Lab Results  Component Value Date   CHOL 164 07/03/2022   HDL 54 07/03/2022   LDLCALC 94 07/03/2022   TRIG 79 07/03/2022   CHOLHDL 3.0 07/03/2022   Lab Results  Component Value Date   TSH 1.060 07/03/2022   Lab Results  Component Value Date   HGBA1C 7.0 (A) 11/03/2022   Lab Results  Component Value Date   WBC 8.5 07/03/2022   HGB 12.3 07/03/2022   HCT 36.6 07/03/2022   MCV 86.5 07/03/2022   PLT 245 07/03/2022   Lab Results  Component Value Date   ALT 17 07/03/2022   AST 24 07/03/2022   ALKPHOS 44 07/03/2022   BILITOT 0.7 07/03/2022   No results found for: "25OHVITD2", "25OHVITD3", "VD25OH"   Review of Systems  Constitutional:  Negative for fatigue and unexpected weight change.  HENT:  Negative for nosebleeds.   Eyes:  Negative for visual disturbance.  Respiratory:  Positive for shortness of breath (with exertion  unchanged). Negative for cough, chest tightness and wheezing.   Cardiovascular:  Negative for chest pain, palpitations and leg swelling.  Gastrointestinal:  Negative for abdominal pain, constipation and diarrhea.  Neurological:  Negative for dizziness, weakness, light-headedness and headaches.    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   Left lumbar radiculopathy 03/13/2021   Piriformis syndrome, left 02/12/2021   Aortic atherosclerosis (HCC) 08/01/2020   Shoulder impingement 02/02/2020   PMR (polymyalgia rheumatica) (HCC) 02/02/2020   Primary osteoarthritis of left hip 01/24/2020   Diffuse photodamage of skin 02/13/2016   Basal cell carcinoma 01/11/2016   Gastroesophageal reflux disease without esophagitis 07/03/2015   Type II diabetes mellitus with complication (HCC) 03/15/2015   Acquired hypothyroidism 10/31/2014   Allergic rhinitis 10/31/2014   Benign hypertension 10/31/2014   CD (celiac disease) 10/31/2014   Hyperlipidemia associated with type 2 diabetes mellitus (HCC) 10/31/2014   OP (osteoporosis) 10/31/2014    Allergies  Allergen Reactions   Ciprofloxacin Hcl Rash    Past Surgical History:  Procedure Laterality Date   ABDOMINAL HYSTERECTOMY  1984   for bleeding   Basil cell     BILATERAL SALPINGOOPHORECTOMY  1984   CATARACT EXTRACTION W/PHACO Right 11/24/2016   Procedure: CATARACT EXTRACTION PHACO AND INTRAOCULAR LENS PLACEMENT (IOC)  Right;  Surgeon: Lockie Mola, MD;  Location: [facility];  Service: Ophthalmology;  Laterality: Right;   CATARACT EXTRACTION W/PHACO Left 02/04/2017   Procedure: CATARACT EXTRACTION PHACO AND INTRAOCULAR LENS PLACEMENT (IOC)  Left;  Surgeon: Lockie Mola, MD;  Location: [facility];  Service: Ophthalmology;  Laterality: Left;    ORIF WRIST FRACTURE Left 2015   TYMPANOPLASTY  1992    Social History   Tobacco Use   Smoking status: Never   Smokeless tobacco: Never  Vaping Use   Vaping status: Never Used  Substance Use Topics   Alcohol use: Never   Drug use: Never     Medication list has been reviewed and updated.  Current Meds  Medication Sig   azelastine (ASTELIN) 0.1 % nasal spray INSTILL 2 SPRAYS IN EACH NOSTRIL TWICE DAILY AS DIRECTED   Biotin 2500 MCG CAPS Take 1 capsule by mouth daily.   calcium-vitamin D (OSCAL WITH D) 500-200 MG-UNIT tablet Take 1 tablet by mouth 2 (two) times daily.   Cranberry 500 MG CAPS Take 1 capsule by mouth daily.    fexofenadine (ALLEGRA) 180 MG tablet Take 1 tablet by mouth daily.   levothyroxine (SYNTHROID) 50 MCG tablet TAKE (1) TABLET BY MOUTH EVERY DAY BEFORE BREAKFAST   lisinopril-hydrochlorothiazide (ZESTORETIC) 20-12.5 MG tablet TAKE (1) TABLET BY MOUTH EVERY DAY   Multiple Vitamins-Minerals (CENTRUM SILVER PO) Take 1 tablet by mouth daily.   omeprazole (PRILOSEC) 20 MG capsule TAKE (1) CAPSULE BY MOUTH EVERY DAY   simvastatin (ZOCOR) 20 MG tablet TAKE (1) TABLET BY MOUTH EVERY DAY AT 6:00 IN THE EVENING   TURMERIC PO Take 1 tablet by mouth daily.    [DISCONTINUED] fluticasone (FLONASE) 50 MCG/ACT nasal spray PLACE 2 SPRAYS INTO BOTH NOSTRILS DAILY.       03/06/2023    3:21 PM 11/03/2022   10:25 AM 08/11/2022   11:18 AM 07/03/2022   10:23 AM  GAD 7 : Generalized Anxiety Score  Nervous, Anxious, on Edge 0 0 0 0  Control/stop worrying 0 0 0 0  Worry too much - different things 0 0 0 0  Trouble relaxing 0 0 0 0  Restless 0 0 0 0  Easily annoyed or irritable 0 0 0 0  Afraid - awful might happen 0 0 0 0  Total GAD 7 Score 0 0 0 0  Anxiety Difficulty Not difficult at all Not difficult at all Not difficult at all Not difficult at all       03/06/2023    3:20 PM 11/03/2022   10:25 AM 08/11/2022   11:18 AM  Depression screen PHQ 2/9  Decreased Interest 0 0 0   Down, Depressed, Hopeless 0 0 0  PHQ - 2 Score 0 0 0  Altered sleeping 0 0 0  Tired, decreased energy 2 0 0  Change in appetite 0 0 0  Feeling bad or failure about yourself  0 0 0  Trouble concentrating 0 0 0  Moving slowly or fidgety/restless 0 0 0  Suicidal thoughts 0 0 0  PHQ-9 Score 2 0 0  Difficult doing work/chores Not difficult at all Not difficult at all Not difficult at all    BP Readings from Last 3 Encounters:  03/06/23 130/78  12/13/22 (!) 157/80  11/11/22 110/78    Physical Exam Vitals and nursing note reviewed.  Constitutional:      General: She is not in acute distress.    Appearance: She is well-developed.  HENT:  Head: Normocephalic and atraumatic.  Cardiovascular:     Rate and Rhythm: Normal rate and regular rhythm.     Pulses: Normal pulses.  Pulmonary:     Effort: Pulmonary effort is normal. No respiratory distress.     Breath sounds: No wheezing or rhonchi.  Musculoskeletal:        General: Normal range of motion.     Cervical back: Normal range of motion.     Right lower leg: No edema.     Left lower leg: No edema.     Comments: OA changes of fingers on both hands  Lymphadenopathy:     Cervical: No cervical adenopathy.  Skin:    General: Skin is warm and dry.     Findings: No rash.  Neurological:     General: No focal deficit present.     Mental Status: She is alert and oriented to person, place, and time.  Psychiatric:        Mood and Affect: Mood normal.        Behavior: Behavior normal.     Wt Readings from Last 3 Encounters:  03/06/23 159 lb (72.1 kg)  12/13/22 160 lb (72.6 kg)  11/11/22 161 lb (73 kg)    BP 130/78   Pulse 75   Ht 5\' 5"  (1.651 m)   Wt 159 lb (72.1 kg)   SpO2 97%   BMI 26.46 kg/m   Assessment and Plan:  Problem List Items Addressed This Visit       Unprioritized   Type II diabetes mellitus with complication (HCC) (Chronic)    Blood sugars stable without hypoglycemic symptoms or events. Current  regimen is diet only. Changes made last visit are none. Lab Results  Component Value Date   HGBA1C 7.0 (A) 11/03/2022         Relevant Orders   Hemoglobin A1c   Basic metabolic panel   PMR (polymyalgia rheumatica) (HCC) (Chronic)    Some hand stiffness but no proximal muscle pain or weakness to suggest flare Continue to monitor - ESR today      Relevant Orders   Sedimentation rate   Gastroesophageal reflux disease without esophagitis (Chronic)    One episode of severe heartburn despite omeprazole. Took TUMS and symptoms have not returned      Benign hypertension - Primary (Chronic)    Normal exam with stable BP on lisinopril and hctz. No concerns or side effects to current medication. No change in regimen; continue low sodium diet.       Allergic rhinitis   Relevant Medications   fluticasone (FLONASE) 50 MCG/ACT nasal spray    No follow-ups on file.    Reubin Milan, MD [facility] [place]

## 2023-03-06 NOTE — Assessment & Plan Note (Signed)
Normal exam with stable BP on lisinopril and hctz. No concerns or side effects to current medication. No change in regimen; continue low sodium diet.

## 2023-03-06 NOTE — Assessment & Plan Note (Signed)
Blood sugars stable without hypoglycemic symptoms or events. Current regimen is diet only. Changes made last visit are none. Lab Results  Component Value Date   HGBA1C 7.0 (A) 11/03/2022

## 2023-03-07 LAB — BASIC METABOLIC PANEL
BUN/Creatinine Ratio: 15 (ref 12–28)
BUN: 12 mg/dL (ref 8–27)
CO2: 24 mmol/L (ref 20–29)
Calcium: 9.3 mg/dL (ref 8.7–10.3)
Chloride: 102 mmol/L (ref 96–106)
Creatinine, Ser: 0.78 mg/dL (ref 0.57–1.00)
Glucose: 121 mg/dL — ABNORMAL HIGH (ref 70–99)
Potassium: 4.2 mmol/L (ref 3.5–5.2)
Sodium: 141 mmol/L (ref 134–144)
eGFR: 74 mL/min/{1.73_m2} (ref >59)

## 2023-03-07 LAB — HEMOGLOBIN A1C
Est. average glucose Bld gHb Est-mCnc: 166 mg/dL
Hgb A1c MFr Bld: 7.4 % — ABNORMAL HIGH (ref 4.8–5.6)

## 2023-03-07 LAB — SEDIMENTATION RATE: Sed Rate: 10 mm/h (ref 0–40)

## 2023-04-29 ENCOUNTER — Ambulatory Visit: Payer: Medicare PPO

## 2023-04-29 VITALS — BP 120/60 | Ht 65.0 in | Wt 160.0 lb

## 2023-04-29 DIAGNOSIS — Z0000 Encounter for general adult medical examination without abnormal findings: Secondary | ICD-10-CM | POA: Diagnosis not present

## 2023-04-29 DIAGNOSIS — Z23 Encounter for immunization: Secondary | ICD-10-CM

## 2023-04-29 NOTE — Progress Notes (Signed)
Subjective:   Jill Dunlap is a 88 y.o. female who presents for Medicare Annual (Subsequent) preventive examination.  Visit Complete: In person Cardiac Risk Factors include: advanced age (>26men, >37 women);diabetes mellitus;dyslipidemia;hypertension     Objective:    Today's Vitals   04/29/23 1506 04/29/23 1510  BP: 120/60   Weight: 160 lb (72.6 kg)   Height: 5\' 5"  (1.651 m)   PainSc:  0-No pain   Body mass index is 26.63 kg/m.     04/29/2023    3:14 PM 12/13/2022   11:13 AM 04/16/2022    3:21 PM 04/15/2021    3:42 PM 03/21/2021   10:53 AM 04/11/2020   11:48 AM 01/26/2019   10:20 AM  Advanced Directives  Does Patient Have a Medical Advance Directive? No Yes Yes Yes Yes Yes Yes  Type of Furniture conservator/restorer;Living will Living will Healthcare Power of [place];Living will  Healthcare Power of [place];Living will Healthcare Power of [place];Living will  Does patient want to make changes to medical advance directive?     No - Patient declined    Copy of Healthcare Power of Attorney in Chart?    No - copy requested  No - copy requested No - copy requested  Would patient like information on creating a medical advance directive? No - Patient declined          Current Medications (verified) Outpatient Encounter Medications as of 04/29/2023  Medication Sig   azelastine (ASTELIN) 0.1 % nasal spray INSTILL 2 SPRAYS IN EACH NOSTRIL TWICE DAILY AS DIRECTED   Biotin 2500 MCG CAPS Take 1 capsule by mouth daily.   calcium-vitamin D (OSCAL WITH D) 500-200 MG-UNIT tablet Take 1 tablet by mouth 2 (two) times daily.   Cranberry 500 MG CAPS Take 1 capsule by mouth daily.    fexofenadine (ALLEGRA) 180 MG tablet Take 1 tablet by mouth daily.   fluticasone (FLONASE) 50 MCG/ACT nasal spray Place 2 sprays into both nostrils daily.   levothyroxine (SYNTHROID) 50 MCG tablet TAKE (1) TABLET BY MOUTH EVERY DAY BEFORE BREAKFAST   lisinopril-hydrochlorothiazide  (ZESTORETIC) 20-12.5 MG tablet TAKE (1) TABLET BY MOUTH EVERY DAY   Multiple Vitamins-Minerals (CENTRUM SILVER PO) Take 1 tablet by mouth daily.   omeprazole (PRILOSEC) 20 MG capsule TAKE (1) CAPSULE BY MOUTH EVERY DAY   simvastatin (ZOCOR) 20 MG tablet TAKE (1) TABLET BY MOUTH EVERY DAY AT 6:00 IN THE EVENING   TURMERIC PO Take 1 tablet by mouth daily.    [DISCONTINUED] Omeprazole (PRILOSEC PO) Take 1 tablet by mouth.   No facility-administered encounter medications on file as of 04/29/2023.    Allergies (verified) Ciprofloxacin hcl   History: Past Medical History:  Diagnosis Date   Acid reflux    Allergy    Cancer (HCC)    skin ca on leg head and back   Celiac disease    Current moderate episode of major depressive disorder without prior episode (HCC)    Gastroesophageal reflux disease without esophagitis    Hyperlipidemia    Hypertension    Hypothyroidism    OP (osteoporosis)    Piriformis syndrome, left 02/12/2021   Pre-diabetes    Primary osteoarthritis of left hip    Type II diabetes mellitus with complication (HCC)    Vertigo    no episodes in several years   Wears dentures    partial lower   Past Surgical History:  Procedure Laterality Date   ABDOMINAL HYSTERECTOMY  1984  for bleeding   Basil cell     BILATERAL SALPINGOOPHORECTOMY  1984   CATARACT EXTRACTION W/PHACO Right 11/24/2016   Procedure: CATARACT EXTRACTION PHACO AND INTRAOCULAR LENS PLACEMENT (IOC) Right;  Surgeon: Lockie Mola, MD;  Location: [facility];  Service: Ophthalmology;  Laterality: Right;   CATARACT EXTRACTION W/PHACO Left 02/04/2017   Procedure: CATARACT EXTRACTION PHACO AND INTRAOCULAR LENS PLACEMENT (IOC)  Left;  Surgeon: Lockie Mola, MD;  Location: [facility];  Service: Ophthalmology;  Laterality: Left;   ORIF WRIST FRACTURE Left 2015   TYMPANOPLASTY  1992   Family History  Problem Relation Age of Onset   Cancer Mother        oral   Hypertension Father     Heart disease Father    Breast cancer Neg Hx    Social History   Socioeconomic History   Marital status: Widowed    Spouse name: Not on file   Number of children: 2   Years of education: 16   Highest education level: Bachelor's degree (e.g., BA, AB, BS)  Occupational History   Occupation: Retired  Tobacco Use   Smoking status: Never   Smokeless tobacco: Never  Vaping Use   Vaping status: Never Used  Substance and Sexual Activity   Alcohol use: Never   Drug use: Never   Sexual activity: Not Currently    Partners: Male  Other Topics Concern   Not on file  Social History Narrative   Pt lives alone   Social Determinants of Health   Financial Resource Strain: Low Risk  (04/29/2023)   Overall Financial Resource Strain (CARDIA)    Difficulty of Paying Living Expenses: Not hard at all  Food Insecurity: No Food Insecurity (04/29/2023)   Hunger Vital Sign    Worried About Running Out of Food in the Last Year: Never true    Ran Out of Food in the Last Year: Never true  Transportation Needs: No Transportation Needs (04/29/2023)   PRAPARE - Administrator, Civil Service (Medical): No    Lack of Transportation (Non-Medical): No  Physical Activity: Insufficiently Active (04/29/2023)   Exercise Vital Sign    Days of Exercise per Week: 3 days    Minutes of Exercise per Session: 30 min  Stress: No Stress Concern Present (04/29/2023)   [employer] of Occupational Health - Occupational Stress Questionnaire    Feeling of Stress : Not at all  Social Connections: Moderately Isolated (04/29/2023)   Social Connection and Isolation Panel [NHANES]    Frequency of Communication with Friends and Family: More than three times a week    Frequency of Social Gatherings with Friends and Family: Three times a week    Attends Religious Services: More than 4 times per year    Active Member of Clubs or Organizations: No    Attends Banker Meetings: Never    Marital  Status: Widowed    Tobacco Counseling Counseling given: Not Answered   Clinical Intake:  Pre-visit preparation completed: Yes  Pain : No/denies pain Pain Score: 0-No pain     Nutritional Status: BMI 25 -29 Overweight Nutritional Risks: None Diabetes: No  How often do you need to have someone help you when you read instructions, pamphlets, or other written materials from your doctor or pharmacy?: 1 - Never  Interpreter Needed?: No  Information entered by :: Kennedy Bucker, LPN   Activities of Daily Living    04/29/2023    3:15 PM 04/28/2023  6:12 PM  In your present state of health, do you have any difficulty performing the following activities:  Hearing? 1 1  Vision? 0 0  Difficulty concentrating or making decisions? 0 0  Walking or climbing stairs? 1 0  Comment hips/legs arthritis   Dressing or bathing? 0 0  Doing errands, shopping? 0 0  Preparing Food and eating ? N N  Using the Toilet? N N  In the past six months, have you accidently leaked urine? N N  Do you have problems with loss of bowel control? N Y  Managing your Medications? N N  Managing your Finances? N N  Housekeeping or managing your Housekeeping? N N    Patient Care Team: Reubin Milan, MD as PCP - General (Internal Medicine) Midge Minium, MD as Consulting Physician (Gastroenterology) Lamar Blinks, MD as Consulting Physician (Cardiology) Lockie Mola, MD as Referring Physician (Ophthalmology)  Indicate any recent Medical Services you may have received from other than [facility] providers in the past year (date may be approximate).     Assessment:   This is a routine wellness examination for Spirit.  Hearing/Vision screen Hearing Screening - Comments:: Wears aids, both ears Vision Screening - Comments:: Wears glasses-Dr.Brasington   Goals Addressed             This Visit's Progress    DIET - EAT MORE FRUITS AND VEGETABLES         Depression Screen    04/29/2023     3:13 PM 03/06/2023    3:20 PM 11/03/2022   10:25 AM 08/11/2022   11:18 AM 07/03/2022   10:22 AM 04/16/2022    3:22 PM 02/05/2022   11:00 AM  PHQ 2/9 Scores  PHQ - 2 Score 0 0 0 0 0 0 0  PHQ- 9 Score 0 2 0 0 2 0 0    Fall Risk    04/29/2023    3:15 PM 04/28/2023    6:12 PM 03/06/2023    3:20 PM 11/03/2022   10:25 AM 08/11/2022   11:18 AM  Fall Risk   Falls in the past year? 0 0 0 0 0  Number falls in past yr: 0  0 0 0  Injury with Fall? 0  0 0 0  Risk for fall due to : No Fall Risks  No Fall Risks No Fall Risks No Fall Risks  Follow up Falls prevention discussed;Falls evaluation completed  Falls evaluation completed Falls evaluation completed Falls evaluation completed    MEDICARE RISK AT HOME: Medicare Risk at Home Any stairs in or around the home?: No If so, are there any without handrails?: No Home free of loose throw rugs in walkways, pet beds, electrical cords, etc?: Yes Adequate lighting in your home to reduce risk of falls?: Yes Life alert?: No Use of a cane, walker or w/c?: No Grab bars in the bathroom?: Yes Shower chair or bench in shower?: Yes Elevated toilet seat or a handicapped toilet?: Yes  TIMED UP AND GO:  Was the test performed?  Yes  Length of time to ambulate 10 feet: 4 sec Gait steady and fast without use of assistive device    Cognitive Function:        04/29/2023    3:19 PM 04/16/2022    3:23 PM 01/26/2019   10:23 AM 11/02/2017    9:15 AM 10/29/2016    9:35 AM  6CIT Screen  What Year? 0 points 0 points 0 points 0 points 0 points  What month? 0 points 0 points 0 points 0 points 0 points  What time? 0 points 0 points 0 points 0 points 0 points  Count back from 20 0 points 0 points 0 points 0 points 0 points  Months in reverse 0 points 0 points 0 points 0 points 0 points  Repeat phrase 2 points 4 points 0 points 2 points 0 points  Total Score 2 points 4 points 0 points 2 points 0 points    Immunizations Immunization History  Administered Date(s)  Administered   Fluad Quad(high Dose 65+) 04/25/2020, 03/27/2021, 04/16/2022   Influenza, High Dose Seasonal PF 04/27/2017, 04/28/2019   Influenza-Unspecified 04/27/2015, 04/30/2018   PFIZER Comirnaty(Gray Top)Covid-19 Tri-Sucrose Vaccine 12/04/2020   PFIZER(Purple Top)SARS-COV-2 Vaccination 07/27/2019, 08/17/2019, 03/30/2020   Pfizer Covid-19 Vaccine Bivalent Booster 9yrs & up 05/16/2021   Pneumococcal Conjugate-13 06/29/2014   Pneumococcal Polysaccharide-23 10/30/2008   Td 10/30/2008   Td (Adult), 2 Lf Tetanus Toxid, Preservative Free 10/30/2008   Zoster Recombinant(Shingrix) 04/07/2018, 06/09/2018   Zoster, Live 10/30/2008    TDAP status: Due, Education has been provided regarding the importance of this vaccine. Advised may receive this vaccine at local pharmacy or Health Dept. Aware to provide a copy of the vaccination record if obtained from local pharmacy or Health Dept. Verbalized acceptance and understanding.  Flu Vaccine status: Completed at today's visit  Pneumococcal vaccine status: Up to date  Covid-19 vaccine status: Completed vaccines  Qualifies for Shingles Vaccine? Yes   Zostavax completed Yes   Shingrix Completed?: Yes  Screening Tests Health Maintenance  Topic Date Due   DTaP/Tdap/Td (2 - Tdap) 10/31/2018   INFLUENZA VACCINE  02/19/2023   COVID-19 Vaccine (6 - 2023-24 season) 03/22/2023   HEMOGLOBIN A1C  09/06/2023   FOOT EXAM  11/03/2023   MAMMOGRAM  11/26/2023   OPHTHALMOLOGY EXAM  01/26/2024   Medicare Annual Wellness (AWV)  04/28/2024   Pneumonia Vaccine 35+ Years old  Completed   DEXA SCAN  Completed   Zoster Vaccines- Shingrix  Completed   HPV VACCINES  Aged Out    Health Maintenance  Health Maintenance Due  Topic Date Due   DTaP/Tdap/Td (2 - Tdap) 10/31/2018   INFLUENZA VACCINE  02/19/2023   COVID-19 Vaccine (6 - 2023-24 season) 03/22/2023    Colorectal cancer screening: No longer required.   Mammogram status: No longer required due to  age.  Bone Density status: Completed 01/22/21. Results reflect: Bone density results: OSTEOPENIA. Repeat every 5 years.- AGED OUT  Lung Cancer Screening: (Low Dose CT Chest recommended if Age 83-80 years, 20 pack-year currently smoking OR have quit w/in 15years.) does not qualify.    Additional Screening:  Hepatitis C Screening: does not qualify; Completed NO  Vision Screening: Recommended annual ophthalmology exams for early detection of glaucoma and other disorders of the eye. Is the patient up to date with their annual eye exam?  Yes  Who is the provider or what is the name of the office in which the patient attends annual eye exams? Dr.Brasington If pt is not established with a provider, would they like to be referred to a provider to establish care? No .   Dental Screening: Recommended annual dental exams for proper oral hygiene   Community Resource Referral / Chronic Care Management: CRR required this visit?  No   CCM required this visit?  No     Plan:     I have personally reviewed and noted the following in the patient's chart:   Medical and  social history Use of alcohol, tobacco or illicit drugs  Current medications and supplements including opioid prescriptions. Patient is not currently taking opioid prescriptions. Functional ability and status Nutritional status Physical activity Advanced directives List of other physicians Hospitalizations, surgeries, and ER visits in previous 12 months Vitals Screenings to include cognitive, depression, and falls Referrals and appointments  In addition, I have reviewed and discussed with patient certain preventive protocols, quality metrics, and best practice recommendations. A written personalized care plan for preventive services as well as general preventive health recommendations were provided to patient.     Hal Hope, LPN   44/0/1027   After Visit Summary: my chart  Nurse Notes: none

## 2023-04-29 NOTE — Patient Instructions (Addendum)
Jill Dunlap , Thank you for taking time to come for your Medicare Wellness Visit. I appreciate your ongoing commitment to your health goals. Please review the following plan we discussed and let me know if I can assist you in the future.   Referrals/Orders/Follow-Ups/Clinician Recommendations: none  This is a list of the screening recommended for you and due dates:  Health Maintenance  Topic Date Due   DTaP/Tdap/Td vaccine (2 - Tdap) 10/31/2018   Flu Shot  02/19/2023   COVID-19 Vaccine (6 - 2023-24 season) 03/22/2023   Hemoglobin A1C  09/06/2023   Complete foot exam   11/03/2023   Mammogram  11/26/2023   Eye exam for diabetics  01/26/2024   Medicare Annual Wellness Visit  04/28/2024   Pneumonia Vaccine  Completed   DEXA scan (bone density measurement)  Completed   Zoster (Shingles) Vaccine  Completed   HPV Vaccine  Aged Out    Advanced directives: (ACP Link)Information on Advanced Care Planning can be found at [facility] [place] Advance Health Care Directives Advance Health Care Directives ([email or web address]     Next Medicare Annual Wellness Visit scheduled for next year: Yes   05/04/24 @ 3:40 pm in person

## 2023-05-19 ENCOUNTER — Telehealth: Payer: Self-pay | Admitting: Internal Medicine

## 2023-05-19 ENCOUNTER — Encounter: Payer: Self-pay | Admitting: Internal Medicine

## 2023-05-19 NOTE — Telephone Encounter (Signed)
Copied from CRM [id number]. Topic: Appointment Scheduling - Scheduling Inquiry for Clinic >> May 19, 2023 12:16 PM Dominique A wrote: Reason for CRM: Pt is calling to see if she can get an appt with her PCP on Thur or Friday due to her arthritis in her hands getting worse. Pt cannot do tomorrow due to her having nose surgery. Please call pt back.

## 2023-05-20 DIAGNOSIS — L578 Other skin changes due to chronic exposure to nonionizing radiation: Secondary | ICD-10-CM | POA: Diagnosis not present

## 2023-05-20 DIAGNOSIS — Z85828 Personal history of other malignant neoplasm of skin: Secondary | ICD-10-CM | POA: Diagnosis not present

## 2023-05-20 DIAGNOSIS — C4441 Basal cell carcinoma of skin of scalp and neck: Secondary | ICD-10-CM | POA: Diagnosis not present

## 2023-05-20 DIAGNOSIS — L814 Other melanin hyperpigmentation: Secondary | ICD-10-CM | POA: Diagnosis not present

## 2023-05-21 ENCOUNTER — Encounter: Payer: Self-pay | Admitting: Family Medicine

## 2023-05-21 ENCOUNTER — Ambulatory Visit
Admission: RE | Admit: 2023-05-21 | Discharge: 2023-05-21 | Disposition: A | Discharge status: Home / Self Care | Payer: Medicare PPO | Source: Ambulatory Visit | Attending: Family Medicine | Admitting: Family Medicine

## 2023-05-21 ENCOUNTER — Ambulatory Visit: Payer: Medicare PPO | Admitting: Family Medicine

## 2023-05-21 ENCOUNTER — Ambulatory Visit
Admission: RE | Admit: 2023-05-21 | Discharge: 2023-05-21 | Disposition: A | Discharge status: Home / Self Care | Payer: Medicare PPO | Attending: Family Medicine | Admitting: Family Medicine

## 2023-05-21 VITALS — BP 112/78 | HR 75 | Ht 65.0 in | Wt 158.0 lb

## 2023-05-21 DIAGNOSIS — M19041 Primary osteoarthritis, right hand: Secondary | ICD-10-CM | POA: Diagnosis not present

## 2023-05-21 DIAGNOSIS — M7989 Other specified soft tissue disorders: Secondary | ICD-10-CM | POA: Insufficient documentation

## 2023-05-21 NOTE — Assessment & Plan Note (Signed)
Right-hand-dominant patient presenting with exacerbation of chronic right hand pain, localized about the digits and distal palm, of note has a history of polymyalgia rheumatica treated with steroids in the past.  Has had significant disruption in ADLs, pain, utilizing OTC analgesic creams with limited response.  She denies any head/neck symptoms, no radicular features, no paresthesias in the hand.  Of note she recently had dermatological procedure to her scalp and has been advised to avoid oral NSAIDs x 1 week.  Examination with full sensorimotor function in the radial, median, and ulnar nerve distributions, tenderness about the MCP regions, no overt flexor nodularity noted.  Significant digit swelling noted, see image below:   Document Information  Photos  Right digit swelling  05/21/2023 11:13  Attached To:  Office Visit on 05/21/23 with Jerrol Banana, MD  Source Information  Jerrol Banana, MD  [facility] [place]  Document History    Given her past medical history and current findings concern for psoriatic arthritis versus alternate autoimmune condition.  Plan: - Obtain labs and x-rays today - Start Voltaren gel 4 times a day x 2 weeks, after 2 weeks can use Voltaren gel up to 4 times a day as-needed - Use Tylenol as-needed - Contact your dermatologist to find out when you can safely dose "prednisone 20 mg x 5-7 days" and contact us if needing prescription - Referral coordinator will contact you to schedule visit with rheumatologist

## 2023-05-21 NOTE — Progress Notes (Signed)
Primary Care / Sports Medicine Office Visit  Patient Information:  Patient ID: Jill Dunlap, female DOB: [birth year] Age: 88 y.o. MRN: [id number]   Jill Dunlap is a pleasant 88 y.o. female presenting with the following:  Chief Complaint  Patient presents with   Hand Pain    Right hand. Difficulty using hand, and cannot close her hand. Started months ago but getting worse.    Vitals:   05/21/23 1047  BP: 112/78  Pulse: 75  SpO2: 97%   Vitals:   05/21/23 1047  Weight: 158 lb (71.7 kg)  Height: 5\' 5"  (1.651 m)   Body mass index is 26.29 kg/m.  No results found.   Independent interpretation of notes and tests performed by another provider:   None  Procedures performed:   None  Pertinent History, Exam, Impression, and Recommendations:   Problem List Items Addressed This Visit       Other   Swelling of digit of right hand - Primary    Right-hand-dominant patient presenting with exacerbation of chronic right hand pain, localized about the digits and distal palm, of note has a history of polymyalgia rheumatica treated with steroids in the past.  Has had significant disruption in ADLs, pain, utilizing OTC analgesic creams with limited response.  She denies any head/neck symptoms, no radicular features, no paresthesias in the hand.  Of note she recently had dermatological procedure to her scalp and has been advised to avoid oral NSAIDs x 1 week.  Examination with full sensorimotor function in the radial, median, and ulnar nerve distributions, tenderness about the MCP regions, no overt flexor nodularity noted.  Significant digit swelling noted, see image below:   Document Information  Photos  Right digit swelling  05/21/2023 11:13  Attached To:  Office Visit on 05/21/23 with Jerrol Banana, MD  Source Information  Jerrol Banana, MD  [facility] [place]  Document History    Given her past medical history and current findings concern for  psoriatic arthritis versus alternate autoimmune condition.  Plan: - Obtain labs and x-rays today - Start Voltaren gel 4 times a day x 2 weeks, after 2 weeks can use Voltaren gel up to 4 times a day as-needed - Use Tylenol as-needed - Contact your dermatologist to find out when you can safely dose "prednisone 20 mg x 5-7 days" and contact us if needing prescription - Referral coordinator will contact you to schedule visit with rheumatologist      Relevant Orders   ANA 12Plus Profile, Do All RDL   DG Hand Complete Right     Orders & Medications Medications: No orders of the defined types were placed in this encounter.  Orders Placed This Encounter  Procedures   DG Hand Complete Right   ANA 12Plus Profile, Do All RDL     No follow-ups on file.     Jerrol Banana, MD, [facility]   Primary Care Sports Medicine Primary Care and Sports Medicine at [facility]

## 2023-05-21 NOTE — Patient Instructions (Signed)
-   Obtain labs and x-rays today - Start Voltaren gel 4 times a day x 2 weeks, after 2 weeks can use Voltaren gel up to 4 times a day as-needed - Use Tylenol as-needed - Contact your dermatologist to find out when you can safely dose "prednisone 20 mg x 5-7 days" and contact us if needing prescription - Referral coordinator will contact you to schedule visit with rheumatologist

## 2023-05-22 ENCOUNTER — Other Ambulatory Visit: Payer: Self-pay

## 2023-05-22 ENCOUNTER — Other Ambulatory Visit: Payer: Self-pay | Admitting: Internal Medicine

## 2023-05-22 DIAGNOSIS — I10 Essential (primary) hypertension: Secondary | ICD-10-CM

## 2023-05-22 MED ORDER — PREDNISONE 20 MG PO TABS: 20.0000 mg | ORAL_TABLET | Freq: Every day | ORAL | 0 refills | Status: DC

## 2023-05-25 ENCOUNTER — Encounter: Payer: Self-pay | Admitting: Family Medicine

## 2023-06-03 LAB — ANA 12PLUS PROFILE, DO ALL RDL
Anti-CCP Ab, IgG & IgA (RDL): <20 U (ref <20)
Anti-Cardiolipin Ab, IgA (RDL): <12 [APL'U]/mL (ref <12)
Anti-Cardiolipin Ab, IgG (RDL): <15 [GPL'U]/mL (ref <15)
Anti-Cardiolipin Ab, IgM (RDL): <13 [MPL'U]/mL (ref <13)
Anti-Centromere Ab (RDL): <1:40 {titer}
Anti-Chromatin Ab, IgG (RDL): <20 U (ref <20)
Anti-La (SS-B) Ab (RDL): <20 U (ref <20)
Anti-Nuclear Ab by IFA (RDL): POSITIVE — AB
Anti-Ro (SS-A) Ab (RDL): <20 U (ref <20)
Anti-Scl-70 Ab (RDL): <20 U (ref <20)
Anti-Sm Ab (RDL): <20 U (ref <20)
Anti-TPO Ab (RDL): <9 [IU]/mL (ref <9.0)
Anti-U1 RNP Ab (RDL): <20 U (ref <20)
Anti-dsDNA Ab by Farr(RDL): <8 [IU]/mL (ref <8.0)
C3 Complement (RDL): 201 mg/dL — ABNORMAL HIGH (ref 82–167)
C4 Complement (RDL): 30 mg/dL (ref 14–44)
Rheumatoid Factor by Turb RDL: <14 [IU]/mL (ref <14)

## 2023-06-03 LAB — ANA TITER AND PATTERN: Speckled Pattern: 1:160 {titer} — ABNORMAL HIGH

## 2023-06-25 ENCOUNTER — Other Ambulatory Visit: Payer: Self-pay | Admitting: Internal Medicine

## 2023-06-25 DIAGNOSIS — J309 Allergic rhinitis, unspecified: Secondary | ICD-10-CM

## 2023-06-26 NOTE — Telephone Encounter (Signed)
Requested Prescriptions  Pending Prescriptions Disp Refills   fluticasone (FLONASE) 50 MCG/ACT nasal spray [Pharmacy Med Name: FLUTICASONE PROPIONATE SUSPENSION] 48 g 0    Sig: PLACE TWO (2) SPRAYS INTO BOTH NOSTRILS DAILY.     Ear, Nose, and Throat: Nasal Preparations - Corticosteroids Passed - 06/25/2023 11:04 AM      Passed - Valid encounter within last 12 months    Recent Outpatient Visits           1 month ago Swelling of digit of right hand   [place] [facility] at [facility], Ocie Bob, MD   3 months ago Benign hypertension   [place] [facility] at [facility], Nyoka Cowden, MD   7 months ago Impingement of left shoulder   [place] [facility] at [facility], Ocie Bob, MD   7 months ago Benign hypertension   [place] [facility] at [facility], Nyoka Cowden, MD   8 months ago Shoulder impingement   [facility] at [facility], Ocie Bob, MD       Future Appointments             In 1 week Judithann Graves Nyoka Cowden, MD [facility] Primary [facility] & Sports Medicine at [facility], [facility]

## 2023-07-07 ENCOUNTER — Encounter: Payer: Medicare PPO | Admitting: Internal Medicine

## 2023-07-09 ENCOUNTER — Ambulatory Visit (INDEPENDENT_AMBULATORY_CARE_PROVIDER_SITE_OTHER): Payer: Medicare PPO | Admitting: Internal Medicine

## 2023-07-09 ENCOUNTER — Encounter: Payer: Self-pay | Admitting: Internal Medicine

## 2023-07-09 VITALS — BP 112/78 | HR 76 | Ht 65.0 in | Wt 158.4 lb

## 2023-07-09 DIAGNOSIS — M19041 Primary osteoarthritis, right hand: Secondary | ICD-10-CM | POA: Diagnosis not present

## 2023-07-09 DIAGNOSIS — Z0000 Encounter for general adult medical examination without abnormal findings: Secondary | ICD-10-CM | POA: Diagnosis not present

## 2023-07-09 DIAGNOSIS — E118 Type 2 diabetes mellitus with unspecified complications: Secondary | ICD-10-CM

## 2023-07-09 DIAGNOSIS — Z1231 Encounter for screening mammogram for malignant neoplasm of breast: Secondary | ICD-10-CM

## 2023-07-09 DIAGNOSIS — E1169 Type 2 diabetes mellitus with other specified complication: Secondary | ICD-10-CM

## 2023-07-09 DIAGNOSIS — I10 Essential (primary) hypertension: Secondary | ICD-10-CM | POA: Diagnosis not present

## 2023-07-09 DIAGNOSIS — K900 Celiac disease: Secondary | ICD-10-CM

## 2023-07-09 DIAGNOSIS — K219 Gastro-esophageal reflux disease without esophagitis: Secondary | ICD-10-CM

## 2023-07-09 DIAGNOSIS — E039 Hypothyroidism, unspecified: Secondary | ICD-10-CM | POA: Diagnosis not present

## 2023-07-09 DIAGNOSIS — E785 Hyperlipidemia, unspecified: Secondary | ICD-10-CM

## 2023-07-09 DIAGNOSIS — M19042 Primary osteoarthritis, left hand: Secondary | ICD-10-CM

## 2023-07-09 NOTE — Assessment & Plan Note (Signed)
Reflux symptoms are minimal on current therapy - omeprazole. No red flag signs such as weight loss, n/v, melena  

## 2023-07-09 NOTE — Assessment & Plan Note (Signed)
DM due to steroid therapy. Controlled with diet only. Lab Results  Component Value Date   HGBA1C 7.4 (H) 03/06/2023

## 2023-07-09 NOTE — Assessment & Plan Note (Signed)
LDL is  Lab Results  Component Value Date   LDLCALC 94 07/03/2022   Current regimen is simvastatin.  Tolerating medications well without issues.

## 2023-07-09 NOTE — Assessment & Plan Note (Addendum)
Well controlled with diet. She has 3-4 stools per day - uses imodium PRN for activities outside the home

## 2023-07-09 NOTE — Assessment & Plan Note (Signed)
 Controlled BP with normal exam. Current regimen is lisinopril hct. Will continue same medications; encourage continued reduced sodium diet.

## 2023-07-09 NOTE — Assessment & Plan Note (Signed)
Supplemented. Lab Results  Component Value Date   TSH 1.060 07/03/2022

## 2023-07-09 NOTE — Assessment & Plan Note (Signed)
Improved after steroid taper; still has daily stiffness that interferes with ADLs She can take Aleve - one tablet daily and monitor for GI upset

## 2023-07-09 NOTE — Patient Instructions (Signed)
Call [facility] to schedule your mammogram at [phone].  

## 2023-07-09 NOTE — Progress Notes (Signed)
Date:  07/09/2023   Name:  Jill Dunlap   DOB:  08-07-1934   MRN:  [id number]   Chief Complaint: Annual Exam Jill Dunlap is a 88 y.o. female who presents today for her Complete Annual Exam. She feels well. She reports exercising / walking. She reports she is sleeping well. Breast complaints - none.  Mammogram: 11/2022 DEXA: 01/2021 Colonoscopy: 2011  Health Maintenance Due  Topic Date Due   DTaP/Tdap/Td (2 - Tdap) 10/31/2018   COVID-19 Vaccine (6 - 2024-25 season) 03/22/2023    Immunization History  Administered Date(s) Administered   Fluad Quad(high Dose 65+) 04/25/2020, 03/27/2021, 04/16/2022   Fluad Trivalent(High Dose 65+) 04/29/2023   Influenza, High Dose Seasonal PF 04/27/2017, 04/28/2019   Influenza-Unspecified 04/27/2015, 04/30/2018   PFIZER Comirnaty(Gray Top)Covid-19 Tri-Sucrose Vaccine 12/04/2020   PFIZER(Purple Top)SARS-COV-2 Vaccination 07/27/2019, 08/17/2019, 03/30/2020   Pfizer Covid-19 Vaccine Bivalent Booster 69yrs & up 05/16/2021   Pneumococcal Conjugate-13 06/29/2014   Pneumococcal Polysaccharide-23 10/30/2008   Td 10/30/2008   Td (Adult), 2 Lf Tetanus Toxid, Preservative Free 10/30/2008   Zoster Recombinant(Shingrix) 04/07/2018, 06/09/2018   Zoster, Live 10/30/2008     Hypertension Pertinent negatives include no chest pain, headaches, palpitations or shortness of breath. Identifiable causes of hypertension include a thyroid problem.  Hyperlipidemia Pertinent negatives include no chest pain or shortness of breath.  Thyroid Problem Patient reports no constipation, diarrhea, fatigue or palpitations. Her past medical history is significant for hyperlipidemia.  Gastroesophageal Reflux She reports no abdominal pain, no chest pain, no coughing or no wheezing. Pertinent negatives include no fatigue.  OA hands - recent steroid taper helped.  Would like to take Aleve daily.  No hx of GIB but has Celiac.  Review of Systems  Constitutional:   Negative for fatigue and unexpected weight change.  HENT:  Negative for nosebleeds.   Eyes:  Negative for visual disturbance.  Respiratory:  Negative for cough, chest tightness, shortness of breath and wheezing.   Cardiovascular:  Negative for chest pain, palpitations and leg swelling.  Gastrointestinal:  Negative for abdominal pain, constipation and diarrhea.  Neurological:  Negative for dizziness, weakness, light-headedness and headaches.     Lab Results  Component Value Date   NA 141 03/06/2023   K 4.2 03/06/2023   CO2 24 03/06/2023   GLUCOSE 121 (H) 03/06/2023   BUN 12 03/06/2023   CREATININE 0.78 03/06/2023   CALCIUM 9.3 03/06/2023   EGFR 74 03/06/2023   GFRNONAA >60 07/03/2022   Lab Results  Component Value Date   CHOL 164 07/03/2022   HDL 54 07/03/2022   LDLCALC 94 07/03/2022   TRIG 79 07/03/2022   CHOLHDL 3.0 07/03/2022   Lab Results  Component Value Date   TSH 1.060 07/03/2022   Lab Results  Component Value Date   HGBA1C 7.4 (H) 03/06/2023   Lab Results  Component Value Date   WBC 8.5 07/03/2022   HGB 12.3 07/03/2022   HCT 36.6 07/03/2022   MCV 86.5 07/03/2022   PLT 245 07/03/2022   Lab Results  Component Value Date   ALT 17 07/03/2022   AST 24 07/03/2022   ALKPHOS 44 07/03/2022   BILITOT 0.7 07/03/2022   No results found for: "25OHVITD2", "25OHVITD3", "VD25OH"   Patient Active Problem List   Diagnosis Date Noted   Osteoarthritis of fingers of both hands 07/09/2023   Swelling of digit of right hand 05/21/2023   Current moderate episode of major depressive disorder without prior episode (HCC) 11/03/2022  Chronic pain of right wrist 08/07/2022   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   Left lumbar radiculopathy 03/13/2021   Piriformis syndrome, left 02/12/2021   Aortic atherosclerosis (HCC) 08/01/2020   Shoulder impingement 02/02/2020   PMR (polymyalgia rheumatica) (HCC) 02/02/2020   Primary osteoarthritis of left hip 01/24/2020   Diffuse photodamage  of skin 02/13/2016   Basal cell carcinoma 01/11/2016   Gastroesophageal reflux disease without esophagitis 07/03/2015   Type II diabetes mellitus with complication (HCC) 03/15/2015   Acquired hypothyroidism 10/31/2014   Allergic rhinitis 10/31/2014   Benign hypertension 10/31/2014   CD (celiac disease) 10/31/2014   Hyperlipidemia associated with type 2 diabetes mellitus (HCC) 10/31/2014   OP (osteoporosis) 10/31/2014    Allergies  Allergen Reactions   Ciprofloxacin Hcl Rash    Past Surgical History:  Procedure Laterality Date   ABDOMINAL HYSTERECTOMY  1984   for bleeding   Basil cell     BILATERAL SALPINGOOPHORECTOMY  1984   CATARACT EXTRACTION W/PHACO Right 11/24/2016   Procedure: CATARACT EXTRACTION PHACO AND INTRAOCULAR LENS PLACEMENT (IOC) Right;  Surgeon: Lockie Mola, MD;  Location: [facility];  Service: Ophthalmology;  Laterality: Right;   CATARACT EXTRACTION W/PHACO Left 02/04/2017   Procedure: CATARACT EXTRACTION PHACO AND INTRAOCULAR LENS PLACEMENT (IOC)  Left;  Surgeon: Lockie Mola, MD;  Location: [facility];  Service: Ophthalmology;  Laterality: Left;   ORIF WRIST FRACTURE Left 2015   TYMPANOPLASTY  1992    Social History   Tobacco Use   Smoking status: Never   Smokeless tobacco: Never  Vaping Use   Vaping status: Never Used  Substance Use Topics   Alcohol use: Never   Drug use: Never     Medication list has been reviewed and updated.  Current Meds  Medication Sig   azelastine (ASTELIN) 0.1 % nasal spray INSTILL 2 SPRAYS IN EACH NOSTRIL TWICE DAILY AS DIRECTED   Biotin 2500 MCG CAPS Take 1 capsule by mouth daily.   calcium-vitamin D (OSCAL WITH D) 500-200 MG-UNIT tablet Take 1 tablet by mouth 2 (two) times daily.   Cranberry 500 MG CAPS Take 1 capsule by mouth daily.    fexofenadine (ALLEGRA) 180 MG tablet Take 1 tablet by mouth daily.   fluticasone (FLONASE) 50 MCG/ACT nasal spray PLACE TWO (2) SPRAYS INTO BOTH NOSTRILS  DAILY.   levothyroxine (SYNTHROID) 50 MCG tablet TAKE (1) TABLET BY MOUTH EVERY DAY BEFORE BREAKFAST   lisinopril-hydrochlorothiazide (ZESTORETIC) 20-12.5 MG tablet TAKE (1) TABLET BY MOUTH EVERY DAY   Multiple Vitamins-Minerals (CENTRUM SILVER PO) Take 1 tablet by mouth daily.   omeprazole (PRILOSEC) 20 MG capsule TAKE (1) CAPSULE BY MOUTH EVERY DAY   simvastatin (ZOCOR) 20 MG tablet TAKE (1) TABLET BY MOUTH EVERY DAY AT 6:00 IN THE EVENING   TURMERIC PO Take 1 tablet by mouth daily.        07/09/2023    9:49 AM 03/06/2023    3:21 PM 11/03/2022   10:25 AM 08/11/2022   11:18 AM  GAD 7 : Generalized Anxiety Score  Nervous, Anxious, on Edge 0 0 0 0  Control/stop worrying 0 0 0 0  Worry too much - different things 0 0 0 0  Trouble relaxing 0 0 0 0  Restless 0 0 0 0  Easily annoyed or irritable 0 0 0 0  Afraid - awful might happen 0 0 0 0  Total GAD 7 Score 0 0 0 0  Anxiety Difficulty Not difficult at all Not difficult at all Not difficult at  all Not difficult at all       07/09/2023    9:49 AM 04/29/2023    3:13 PM 03/06/2023    3:20 PM  Depression screen PHQ 2/9  Decreased Interest 0 0 0  Down, Depressed, Hopeless 0 0 0  PHQ - 2 Score 0 0 0  Altered sleeping 0 0 0  Tired, decreased energy 2 0 2  Change in appetite 0 0 0  Feeling bad or failure about yourself  0 0 0  Trouble concentrating 0 0 0  Moving slowly or fidgety/restless 0 0 0  Suicidal thoughts 0 0 0  PHQ-9 Score 2 0 2  Difficult doing work/chores Not difficult at all Not difficult at all Not difficult at all    BP Readings from Last 3 Encounters:  07/09/23 112/78  05/21/23 112/78  04/29/23 120/60    Physical Exam Vitals and nursing note reviewed.  Constitutional:      General: She is not in acute distress.    Appearance: She is well-developed.  HENT:     Head: Normocephalic and atraumatic.     Right Ear: Tympanic membrane and ear canal normal.     Left Ear: Tympanic membrane and ear canal normal.      Nose:     Right Sinus: No maxillary sinus tenderness.     Left Sinus: No maxillary sinus tenderness.  Eyes:     General: No scleral icterus.       Right eye: No discharge.        Left eye: No discharge.     Conjunctiva/sclera: Conjunctivae normal.  Neck:     Thyroid: No thyromegaly.     Vascular: No carotid bruit.  Cardiovascular:     Rate and Rhythm: Normal rate and regular rhythm.     Pulses: Normal pulses.     Heart sounds: Normal heart sounds.  Pulmonary:     Effort: Pulmonary effort is normal. No respiratory distress.     Breath sounds: No wheezing.  Abdominal:     General: Bowel sounds are normal.     Palpations: Abdomen is soft.     Tenderness: There is no abdominal tenderness.  Musculoskeletal:     Right hand: Deformity present.     Left hand: Deformity present.     Cervical back: Normal range of motion. No erythema.     Right lower leg: No edema.     Left lower leg: No edema.     Comments: Bouchard's and Heberden's nodes bilaterally Thenar wasting right  No soft tissue swelling/induration/synovitis  Lymphadenopathy:     Cervical: No cervical adenopathy.  Skin:    General: Skin is warm and dry.     Findings: No rash.  Neurological:     Mental Status: She is alert and oriented to person, place, and time.     Cranial Nerves: No cranial nerve deficit.     Sensory: No sensory deficit.     Deep Tendon Reflexes: Reflexes are normal and symmetric.  Psychiatric:        Attention and Perception: Attention normal.        Mood and Affect: Mood normal.     Wt Readings from Last 3 Encounters:  07/09/23 158 lb 6.4 oz (71.8 kg)  05/21/23 158 lb (71.7 kg)  04/29/23 160 lb (72.6 kg)    BP 112/78   Pulse 76   Ht 5\' 5"  (1.651 m)   Wt 158 lb 6.4 oz (71.8 kg)   SpO2 98%  BMI 26.36 kg/m   Assessment and Plan:  Problem List Items Addressed This Visit       Unprioritized   Acquired hypothyroidism (Chronic)   Supplemented. Lab Results  Component Value Date   TSH  1.060 07/03/2022         Relevant Orders   TSH + free T4   Benign hypertension (Chronic)   Controlled BP with normal exam. Current regimen is lisinopril hct. Will continue same medications; encourage continued reduced sodium diet.       Relevant Orders   CBC with Differential/Platelet   Comprehensive metabolic panel   CD (celiac disease)   Well controlled with diet. She has 3-4 stools per day - uses imodium PRN for activities outside the home      Hyperlipidemia associated with type 2 diabetes mellitus (HCC) (Chronic)   LDL is  Lab Results  Component Value Date   LDLCALC 94 07/03/2022   Current regimen is simvastatin.  Tolerating medications well without issues.       Relevant Orders   Lipid panel   Type II diabetes mellitus with complication (HCC) (Chronic)   DM due to steroid therapy. Controlled with diet only. Lab Results  Component Value Date   HGBA1C 7.4 (H) 03/06/2023          Relevant Orders   Hemoglobin A1c   Gastroesophageal reflux disease without esophagitis (Chronic)   Reflux symptoms are minimal on current therapy - omeprazole. No red flag signs such as weight loss, n/v, melena       Relevant Orders   CBC with Differential/Platelet   Osteoarthritis of fingers of both hands   Improved after steroid taper; still has daily stiffness that interferes with ADLs She can take Aleve - one tablet daily and monitor for GI upset      Other Visit Diagnoses       Annual physical exam    -  Primary     Encounter for screening mammogram for breast cancer       Relevant Orders   MM 3D SCREENING MAMMOGRAM BILATERAL BREAST       Return in about 4 months (around 11/07/2023) for DM, HTN.    Reubin Milan, MD [facility] [place]

## 2023-07-10 DIAGNOSIS — E785 Hyperlipidemia, unspecified: Secondary | ICD-10-CM | POA: Diagnosis not present

## 2023-07-10 DIAGNOSIS — E118 Type 2 diabetes mellitus with unspecified complications: Secondary | ICD-10-CM | POA: Diagnosis not present

## 2023-07-10 DIAGNOSIS — I10 Essential (primary) hypertension: Secondary | ICD-10-CM | POA: Diagnosis not present

## 2023-07-10 DIAGNOSIS — K219 Gastro-esophageal reflux disease without esophagitis: Secondary | ICD-10-CM | POA: Diagnosis not present

## 2023-07-10 DIAGNOSIS — E039 Hypothyroidism, unspecified: Secondary | ICD-10-CM | POA: Diagnosis not present

## 2023-07-10 DIAGNOSIS — E1169 Type 2 diabetes mellitus with other specified complication: Secondary | ICD-10-CM | POA: Diagnosis not present

## 2023-07-11 LAB — COMPREHENSIVE METABOLIC PANEL
ALT: 14 [IU]/L (ref 0–32)
AST: 19 [IU]/L (ref 0–40)
Albumin: 4.1 g/dL (ref 3.7–4.7)
Alkaline Phosphatase: 53 [IU]/L (ref 44–121)
BUN/Creatinine Ratio: 24 (ref 12–28)
BUN: 20 mg/dL (ref 8–27)
Bilirubin Total: 0.3 mg/dL (ref 0.0–1.2)
CO2: 22 mmol/L (ref 20–29)
Calcium: 9.5 mg/dL (ref 8.7–10.3)
Chloride: 104 mmol/L (ref 96–106)
Creatinine, Ser: 0.84 mg/dL (ref 0.57–1.00)
Globulin, Total: 2.5 g/dL (ref 1.5–4.5)
Glucose: 148 mg/dL — ABNORMAL HIGH (ref 70–99)
Potassium: 4.3 mmol/L (ref 3.5–5.2)
Sodium: 140 mmol/L (ref 134–144)
Total Protein: 6.6 g/dL (ref 6.0–8.5)
eGFR: 68 mL/min/{1.73_m2} (ref >59)

## 2023-07-11 LAB — CBC WITH DIFFERENTIAL/PLATELET
Basophils Absolute: 0 10*3/uL (ref 0.0–0.2)
Basos: 1 %
EOS (ABSOLUTE): 0.2 10*3/uL (ref 0.0–0.4)
Eos: 3 %
Hematocrit: 37.1 % (ref 34.0–46.6)
Hemoglobin: 11.7 g/dL (ref 11.1–15.9)
Immature Grans (Abs): 0 10*3/uL (ref 0.0–0.1)
Immature Granulocytes: 0 %
Lymphocytes Absolute: 1.5 10*3/uL (ref 0.7–3.1)
Lymphs: 27 %
MCH: 27.2 pg (ref 26.6–33.0)
MCHC: 31.5 g/dL (ref 31.5–35.7)
MCV: 86 fL (ref 79–97)
Monocytes Absolute: 0.7 10*3/uL (ref 0.1–0.9)
Monocytes: 12 %
Neutrophils Absolute: 3.2 10*3/uL (ref 1.4–7.0)
Neutrophils: 57 %
Platelets: 247 10*3/uL (ref 150–450)
RBC: 4.3 x10E6/uL (ref 3.77–5.28)
RDW: 13.5 % (ref 11.7–15.4)
WBC: 5.6 10*3/uL (ref 3.4–10.8)

## 2023-07-11 LAB — TSH+FREE T4
Free T4: 0.87 ng/dL (ref 0.82–1.77)
TSH: 2.04 u[IU]/mL (ref 0.450–4.500)

## 2023-07-11 LAB — LIPID PANEL
Chol/HDL Ratio: 3.3 {ratio} (ref 0.0–4.4)
Cholesterol, Total: 155 mg/dL (ref 100–199)
HDL: 47 mg/dL (ref >39)
LDL Chol Calc (NIH): 87 mg/dL (ref 0–99)
Triglycerides: 114 mg/dL (ref 0–149)
VLDL Cholesterol Cal: 21 mg/dL (ref 5–40)

## 2023-07-11 LAB — HEMOGLOBIN A1C
Est. average glucose Bld gHb Est-mCnc: 174 mg/dL
Hgb A1c MFr Bld: 7.7 % — ABNORMAL HIGH (ref 4.8–5.6)

## 2023-07-28 DIAGNOSIS — H722X2 Other marginal perforations of tympanic membrane, left ear: Secondary | ICD-10-CM | POA: Diagnosis not present

## 2023-07-28 DIAGNOSIS — H903 Sensorineural hearing loss, bilateral: Secondary | ICD-10-CM | POA: Diagnosis not present

## 2023-07-28 DIAGNOSIS — H9212 Otorrhea, left ear: Secondary | ICD-10-CM | POA: Diagnosis not present

## 2023-07-28 DIAGNOSIS — J301 Allergic rhinitis due to pollen: Secondary | ICD-10-CM | POA: Diagnosis not present

## 2023-07-31 ENCOUNTER — Other Ambulatory Visit: Payer: Self-pay | Admitting: Internal Medicine

## 2023-07-31 DIAGNOSIS — K219 Gastro-esophageal reflux disease without esophagitis: Secondary | ICD-10-CM

## 2023-08-03 NOTE — Telephone Encounter (Signed)
 Requested Prescriptions  Pending Prescriptions Disp Refills   omeprazole  (PRILOSEC) 20 MG capsule [Pharmacy Med Name: OMEPRAZOLE  20MG  CAPSULE DR] 90 capsule 1    Sig: TAKE (1) CAPSULE BY MOUTH EVERY DAY     Gastroenterology: Proton Pump Inhibitors Passed - 08/03/2023  2:44 PM      Passed - Valid encounter within last 12 months    Recent Outpatient Visits           3 weeks ago Annual physical exam   [place] [facility] at [facility], Leita DEL, MD   2 months ago Swelling of digit of right hand   [place] [facility] at [facility], Selinda PARAS, MD   5 months ago Benign hypertension   [place] [facility] at [facility], Leita DEL, MD   8 months ago Impingement of left shoulder   [facility] at [facility], Selinda PARAS, MD   9 months ago Benign hypertension   [place] [facility] at [facility], Leita DEL, MD       Future Appointments             In 3 months Justus, Leita DEL, MD [facility] Medicine at [facility], [facility]

## 2023-08-05 ENCOUNTER — Encounter: Payer: Self-pay | Admitting: Internal Medicine

## 2023-08-11 DIAGNOSIS — H722X2 Other marginal perforations of tympanic membrane, left ear: Secondary | ICD-10-CM | POA: Diagnosis not present

## 2023-08-11 DIAGNOSIS — H903 Sensorineural hearing loss, bilateral: Secondary | ICD-10-CM | POA: Diagnosis not present

## 2023-08-26 ENCOUNTER — Other Ambulatory Visit: Payer: Self-pay | Admitting: Internal Medicine

## 2023-08-26 DIAGNOSIS — E039 Hypothyroidism, unspecified: Secondary | ICD-10-CM

## 2023-10-02 ENCOUNTER — Ambulatory Visit: Admitting: Internal Medicine

## 2023-10-02 ENCOUNTER — Encounter: Payer: Self-pay | Admitting: Internal Medicine

## 2023-10-02 VITALS — BP 108/66 | HR 74 | Ht 65.0 in | Wt 155.0 lb

## 2023-10-02 DIAGNOSIS — M353 Polymyalgia rheumatica: Secondary | ICD-10-CM | POA: Diagnosis not present

## 2023-10-02 DIAGNOSIS — J309 Allergic rhinitis, unspecified: Secondary | ICD-10-CM | POA: Diagnosis not present

## 2023-10-02 DIAGNOSIS — M7989 Other specified soft tissue disorders: Secondary | ICD-10-CM

## 2023-10-02 DIAGNOSIS — M25819 Other specified joint disorders, unspecified shoulder: Secondary | ICD-10-CM

## 2023-10-02 DIAGNOSIS — G8929 Other chronic pain: Secondary | ICD-10-CM

## 2023-10-02 DIAGNOSIS — M25531 Pain in right wrist: Secondary | ICD-10-CM | POA: Diagnosis not present

## 2023-10-02 DIAGNOSIS — F321 Major depressive disorder, single episode, moderate: Secondary | ICD-10-CM

## 2023-10-02 MED ORDER — FLUTICASONE PROPIONATE 50 MCG/ACT NA SUSP: 2.0000 | Freq: Every day | NASAL | 0 refills | Status: DC

## 2023-10-02 MED ORDER — PREDNISONE 20 MG PO TABS
40.0000 mg | ORAL_TABLET | Freq: Every day | ORAL | 0 refills | Status: AC
Start: 2023-10-02 — End: ?

## 2023-10-02 NOTE — Assessment & Plan Note (Signed)
 Symptoms suggest recurrence of PMR Will get ESR, start prednisone 40 mg daily Refer to Rheum

## 2023-10-02 NOTE — Progress Notes (Signed)
 Date:  10/02/2023   Name:  Jill Dunlap   DOB:  1934-11-03   MRN:  [id number]   Chief Complaint: Generalized Body Aches (Patient said she has body aches, polymyalgia pain is worse, can not sleep, has been using voltaren gel and, salon pas and extra strength tylenol)  Muscle Pain This is a recurrent problem. Associated symptoms include joint swelling. Pertinent negatives include no fatigue, fever, headaches or shortness of breath.  Shoulder Pain  The pain is present in the right shoulder and left shoulder. This is a recurrent problem. Pertinent negatives include no fever.  She has both joint and muscle pain.  The wrists, hands and shoulders are the worst and make it difficult for her to get out of bed and limits ADLs.  She has hx of PMR which mainly affected her upper body.  Last chronic prednisone was in 2023.  She is taking tylenol and using topicals for hands and wrists.  She was seen by SM - ANA was slightly + but no predominant pattern.  The muscle pain is in the upper arms. She is able to get up from a chair without using her UEs.  Review of Systems  Constitutional:  Negative for chills, fatigue and fever.  HENT:  Negative for trouble swallowing.   Respiratory:  Negative for chest tightness and shortness of breath.   Musculoskeletal:  Positive for arthralgias, joint swelling and myalgias.  Neurological:  Negative for dizziness and headaches.  Psychiatric/Behavioral:  Positive for sleep disturbance.      Lab Results  Component Value Date   NA 140 07/10/2023   K 4.3 07/10/2023   CO2 22 07/10/2023   GLUCOSE 148 (H) 07/10/2023   BUN 20 07/10/2023   CREATININE 0.84 07/10/2023   CALCIUM 9.5 07/10/2023   EGFR 68 07/10/2023   GFRNONAA >60 07/03/2022   Lab Results  Component Value Date   CHOL 155 07/10/2023   HDL 47 07/10/2023   LDLCALC 87 07/10/2023   TRIG 114 07/10/2023   CHOLHDL 3.3 07/10/2023   Lab Results  Component Value Date   TSH 2.040 07/10/2023   Lab  Results  Component Value Date   HGBA1C 7.7 (H) 07/10/2023   Lab Results  Component Value Date   WBC 5.6 07/10/2023   HGB 11.7 07/10/2023   HCT 37.1 07/10/2023   MCV 86 07/10/2023   PLT 247 07/10/2023   Lab Results  Component Value Date   ALT 14 07/10/2023   AST 19 07/10/2023   ALKPHOS 53 07/10/2023   BILITOT 0.3 07/10/2023   No results found for: "25OHVITD2", "25OHVITD3", "VD25OH"   Patient Active Problem List   Diagnosis Date Noted   Osteoarthritis of fingers of both hands 07/09/2023   Swelling of digit of right hand 05/21/2023   Chronic pain of right wrist 08/07/2022   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   Left lumbar radiculopathy 03/13/2021   Piriformis syndrome, left 02/12/2021   Aortic atherosclerosis (HCC) 08/01/2020   Shoulder impingement 02/02/2020   PMR (polymyalgia rheumatica) (HCC) 02/02/2020   Primary osteoarthritis of left hip 01/24/2020   Diffuse photodamage of skin 02/13/2016   Basal cell carcinoma 01/11/2016   Gastroesophageal reflux disease without esophagitis 07/03/2015   Type II diabetes mellitus with complication (HCC) 03/15/2015   Acquired hypothyroidism 10/31/2014   Allergic rhinitis 10/31/2014   Benign hypertension 10/31/2014   CD (celiac disease) 10/31/2014   Hyperlipidemia associated with type 2 diabetes mellitus (HCC) 10/31/2014   OP (osteoporosis) 10/31/2014    Allergies  Allergen Reactions   Ciprofloxacin Hcl Rash    Past Surgical History:  Procedure Laterality Date   ABDOMINAL HYSTERECTOMY  1984   for bleeding   Basil cell     BILATERAL SALPINGOOPHORECTOMY  1984   CATARACT EXTRACTION W/PHACO Right 11/24/2016   Procedure: CATARACT EXTRACTION PHACO AND INTRAOCULAR LENS PLACEMENT (IOC) Right;  Surgeon: Lockie Mola, MD;  Location: [facility];  Service: Ophthalmology;  Laterality: Right;   CATARACT EXTRACTION W/PHACO Left 02/04/2017   Procedure: CATARACT EXTRACTION PHACO AND INTRAOCULAR LENS PLACEMENT (IOC)  Left;  Surgeon:  Lockie Mola, MD;  Location: [facility];  Service: Ophthalmology;  Laterality: Left;   ORIF WRIST FRACTURE Left 2015   TYMPANOPLASTY  1992    Social History   Tobacco Use   Smoking status: Never   Smokeless tobacco: Never  Vaping Use   Vaping status: Never Used  Substance Use Topics   Alcohol use: Never   Drug use: Never     Medication list has been reviewed and updated.  Current Meds  Medication Sig   azelastine (ASTELIN) 0.1 % nasal spray INSTILL 2 SPRAYS IN EACH NOSTRIL TWICE DAILY AS DIRECTED   Biotin 2500 MCG CAPS Take 1 capsule by mouth daily.   calcium-vitamin D (OSCAL WITH D) 500-200 MG-UNIT tablet Take 1 tablet by mouth 2 (two) times daily.   Cranberry 500 MG CAPS Take 1 capsule by mouth daily.    fexofenadine (ALLEGRA) 180 MG tablet Take 1 tablet by mouth daily.   levothyroxine (SYNTHROID) 50 MCG tablet TAKE (1) TABLET BY MOUTH EVERY DAY BEFORE BREAKFAST   lisinopril-hydrochlorothiazide (ZESTORETIC) 20-12.5 MG tablet TAKE (1) TABLET BY MOUTH EVERY DAY   Multiple Vitamins-Minerals (CENTRUM SILVER PO) Take 1 tablet by mouth daily.   omeprazole (PRILOSEC) 20 MG capsule TAKE (1) CAPSULE BY MOUTH EVERY DAY   predniSONE (DELTASONE) 20 MG tablet Take 2 tablets (40 mg total) by mouth daily with breakfast.   simvastatin (ZOCOR) 20 MG tablet TAKE (1) TABLET BY MOUTH EVERY DAY AT 6:00 IN THE EVENING   TURMERIC PO Take 1 tablet by mouth daily.    [DISCONTINUED] fluticasone (FLONASE) 50 MCG/ACT nasal spray PLACE TWO (2) SPRAYS INTO BOTH NOSTRILS DAILY.       10/02/2023    9:13 AM 07/09/2023    9:49 AM 03/06/2023    3:21 PM 11/03/2022   10:25 AM  GAD 7 : Generalized Anxiety Score  Nervous, Anxious, on Edge 0 0 0 0  Control/stop worrying 0 0 0 0  Worry too much - different things 0 0 0 0  Trouble relaxing 0 0 0 0  Restless 0 0 0 0  Easily annoyed or irritable 0 0 0 0  Afraid - awful might happen 0 0 0 0  Total GAD 7 Score 0 0 0 0  Anxiety Difficulty Not  difficult at all Not difficult at all Not difficult at all Not difficult at all       10/02/2023    9:12 AM 07/09/2023    9:49 AM 04/29/2023    3:13 PM  Depression screen PHQ 2/9  Decreased Interest 0 0 0  Down, Depressed, Hopeless 0 0 0  PHQ - 2 Score 0 0 0  Altered sleeping 0 0 0  Tired, decreased energy 0 2 0  Change in appetite 0 0 0  Feeling bad or failure about yourself  0 0 0  Trouble concentrating 0 0 0  Moving slowly or fidgety/restless 0 0 0  Suicidal  thoughts 0 0 0  PHQ-9 Score 0 2 0  Difficult doing work/chores Not difficult at all Not difficult at all Not difficult at all    BP Readings from Last 3 Encounters:  10/02/23 108/66  07/09/23 112/78  05/21/23 112/78    Physical Exam Vitals and nursing note reviewed.  Constitutional:      General: She is not in acute distress.    Appearance: Normal appearance. She is well-developed.  HENT:     Head: Normocephalic and atraumatic.  Cardiovascular:     Rate and Rhythm: Normal rate and regular rhythm.  Pulmonary:     Effort: Pulmonary effort is normal. No respiratory distress.     Breath sounds: No wheezing or rhonchi.  Musculoskeletal:     Right shoulder: Tenderness present. Decreased range of motion.     Left shoulder: Tenderness present. Decreased range of motion.     Right upper arm: Tenderness present.     Left upper arm: Tenderness present.     Right elbow: Normal.     Left elbow: Normal.     Right forearm: Normal.     Left forearm: Normal.     Right wrist: Tenderness present. No swelling.     Left wrist: No swelling or tenderness.     Right hand: Decreased range of motion (all digits).     Left hand: Decreased range of motion: all digits.     Cervical back: Normal range of motion.  Lymphadenopathy:     Cervical: No cervical adenopathy.  Skin:    General: Skin is warm and dry.     Findings: No rash.  Neurological:     Mental Status: She is alert and oriented to person, place, and time.  Psychiatric:         Mood and Affect: Mood normal.        Behavior: Behavior normal.     Wt Readings from Last 3 Encounters:  10/02/23 155 lb (70.3 kg)  07/09/23 158 lb 6.4 oz (71.8 kg)  05/21/23 158 lb (71.7 kg)    BP 108/66   Pulse 74   Ht 5\' 5"  (1.651 m)   Wt 155 lb (70.3 kg)   SpO2 94%   BMI 25.79 kg/m   Assessment and Plan:  Problem List Items Addressed This Visit       Unprioritized   Shoulder impingement (Chronic)   PMR (polymyalgia rheumatica) (HCC) - Primary (Chronic)   Symptoms suggest recurrence of PMR Will get ESR, start prednisone 40 mg daily Refer to Rheum      Relevant Medications   predniSONE (DELTASONE) 20 MG tablet   Other Relevant Orders   Sedimentation rate   Ambulatory referral to Rheumatology   Allergic rhinitis   Relevant Medications   fluticasone (FLONASE) 50 MCG/ACT nasal spray   Chronic pain of right wrist   Relevant Medications   predniSONE (DELTASONE) 20 MG tablet   Other Relevant Orders   Ambulatory referral to Rheumatology   RESOLVED: Current moderate episode of major depressive disorder without prior episode (HCC)   Swelling of digit of right hand   Chronic but slightly improved swelling but still painful Unclear if she also has MCTD Will refer to Rheum      Relevant Orders   Ambulatory referral to Rheumatology    No follow-ups on file.    Reubin Milan, MD [facility] [place]

## 2023-10-02 NOTE — Assessment & Plan Note (Signed)
 Chronic but slightly improved swelling but still painful Unclear if she also has MCTD Will refer to Rheum

## 2023-10-03 LAB — SEDIMENTATION RATE: Sed Rate: 20 mm/h (ref 0–40)

## 2023-10-04 ENCOUNTER — Encounter: Payer: Self-pay | Admitting: Internal Medicine

## 2023-11-09 ENCOUNTER — Encounter: Payer: Self-pay | Admitting: Internal Medicine

## 2023-11-09 ENCOUNTER — Ambulatory Visit: Payer: Self-pay | Admitting: Internal Medicine

## 2023-11-09 VITALS — BP 128/76 | HR 63 | Ht 65.0 in | Wt 157.2 lb

## 2023-11-09 DIAGNOSIS — E1169 Type 2 diabetes mellitus with other specified complication: Secondary | ICD-10-CM

## 2023-11-09 DIAGNOSIS — Z7984 Long term (current) use of oral hypoglycemic drugs: Secondary | ICD-10-CM

## 2023-11-09 DIAGNOSIS — E785 Hyperlipidemia, unspecified: Secondary | ICD-10-CM

## 2023-11-09 DIAGNOSIS — E118 Type 2 diabetes mellitus with unspecified complications: Secondary | ICD-10-CM | POA: Diagnosis not present

## 2023-11-09 DIAGNOSIS — M353 Polymyalgia rheumatica: Secondary | ICD-10-CM

## 2023-11-09 DIAGNOSIS — I10 Essential (primary) hypertension: Secondary | ICD-10-CM | POA: Diagnosis not present

## 2023-11-09 DIAGNOSIS — H6502 Acute serous otitis media, left ear: Secondary | ICD-10-CM

## 2023-11-09 LAB — POCT GLYCOSYLATED HEMOGLOBIN (HGB A1C): Hemoglobin A1C: 8.8 % — AB (ref 4.0–5.6)

## 2023-11-09 MED ORDER — GLIPIZIDE 5 MG PO TABS: 5.0000 mg | ORAL_TABLET | Freq: Every day | ORAL | 1 refills | Status: DC

## 2023-11-09 NOTE — Progress Notes (Signed)
 Date:  11/09/2023   Name:  Jill Dunlap   DOB:  03-12-1935   MRN:  [id number]   Chief Complaint: Diabetes and Hypertension  Diabetes She presents for her follow-up diabetic visit. She has type 2 diabetes mellitus. Her disease course has been worsening. There are no hypoglycemic associated symptoms. Pertinent negatives for hypoglycemia include no dizziness, headaches, nervousness/anxiousness, sleepiness or tremors. Pertinent negatives for diabetes include no blurred vision, no chest pain, no fatigue, no foot paresthesias, no weakness and no weight loss. An ACE inhibitor/angiotensin II receptor blocker is being taken.  Hypertension This is a chronic problem. Pertinent negatives include no blurred vision, chest pain, headaches or shortness of breath.    Review of Systems  Constitutional:  Negative for chills, fatigue and weight loss.  HENT:  Negative for ear pain (treated for left otitis).   Eyes:  Negative for blurred vision.  Respiratory:  Negative for chest tightness and shortness of breath.   Cardiovascular:  Negative for chest pain.  Gastrointestinal:  Negative for abdominal pain and diarrhea.  Genitourinary:  Negative for difficulty urinating.  Musculoskeletal:  Negative for arthralgias, joint swelling and myalgias.  Neurological:  Negative for dizziness, tremors, weakness and headaches.  Psychiatric/Behavioral:  Negative for dysphoric mood and sleep disturbance. The patient is not nervous/anxious.      Lab Results  Component Value Date   NA 140 07/10/2023   K 4.3 07/10/2023   CO2 22 07/10/2023   GLUCOSE 148 (H) 07/10/2023   BUN 20 07/10/2023   CREATININE 0.84 07/10/2023   CALCIUM 9.5 07/10/2023   EGFR 68 07/10/2023   GFRNONAA >60 07/03/2022   Lab Results  Component Value Date   CHOL 155 07/10/2023   HDL 47 07/10/2023   LDLCALC 87 07/10/2023   TRIG 114 07/10/2023   CHOLHDL 3.3 07/10/2023   Lab Results  Component Value Date   TSH 2.040 07/10/2023   Lab  Results  Component Value Date   HGBA1C 8.8 (A) 11/09/2023   Lab Results  Component Value Date   WBC 5.6 07/10/2023   HGB 11.7 07/10/2023   HCT 37.1 07/10/2023   MCV 86 07/10/2023   PLT 247 07/10/2023   Lab Results  Component Value Date   ALT 14 07/10/2023   AST 19 07/10/2023   ALKPHOS 53 07/10/2023   BILITOT 0.3 07/10/2023   No results found for: "25OHVITD2", "25OHVITD3", "VD25OH"   Patient Active Problem List   Diagnosis Date Noted   Osteoarthritis of fingers of both hands 07/09/2023   Swelling of digit of right hand 05/21/2023   Chronic pain of right wrist 08/07/2022   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   Left lumbar radiculopathy 03/13/2021   Piriformis syndrome, left 02/12/2021   Aortic atherosclerosis (HCC) 08/01/2020   Shoulder impingement 02/02/2020   PMR (polymyalgia rheumatica) (HCC) 02/02/2020   Primary osteoarthritis of left hip 01/24/2020   Diffuse photodamage of skin 02/13/2016   Basal cell carcinoma 01/11/2016   Gastroesophageal reflux disease without esophagitis 07/03/2015   Type II diabetes mellitus with complication (HCC) 03/15/2015   Acquired hypothyroidism 10/31/2014   Allergic rhinitis 10/31/2014   Benign hypertension 10/31/2014   CD (celiac disease) 10/31/2014   Hyperlipidemia associated with type 2 diabetes mellitus (HCC) 10/31/2014   OP (osteoporosis) 10/31/2014    Allergies  Allergen Reactions   Ciprofloxacin  Hcl Rash    Past Surgical History:  Procedure Laterality Date   ABDOMINAL HYSTERECTOMY  1984   for bleeding   Basil cell  BILATERAL SALPINGOOPHORECTOMY  1984   CATARACT EXTRACTION W/PHACO Right 11/24/2016   Procedure: CATARACT EXTRACTION PHACO AND INTRAOCULAR LENS PLACEMENT (IOC) Right;  Surgeon: Annell Kidney, MD;  Location: [facility];  Service: Ophthalmology;  Laterality: Right;   CATARACT EXTRACTION W/PHACO Left 02/04/2017   Procedure: CATARACT EXTRACTION PHACO AND INTRAOCULAR LENS PLACEMENT (IOC)  Left;  Surgeon:  Annell Kidney, MD;  Location: [facility];  Service: Ophthalmology;  Laterality: Left;   ORIF WRIST FRACTURE Left 2015   TYMPANOPLASTY  1992    Social History   Tobacco Use   Smoking status: Never   Smokeless tobacco: Never  Vaping Use   Vaping status: Never Used  Substance Use Topics   Alcohol use: Never   Drug use: Never     Medication list has been reviewed and updated.  Current Meds  Medication Sig   azelastine  (ASTELIN ) 0.1 % nasal spray INSTILL 2 SPRAYS IN EACH NOSTRIL TWICE DAILY AS DIRECTED   Biotin 2500 MCG CAPS Take 1 capsule by mouth daily.   calcium-vitamin D (OSCAL WITH D) 500-200 MG-UNIT tablet Take 1 tablet by mouth 2 (two) times daily.   Cranberry 500 MG CAPS Take 1 capsule by mouth daily.    fexofenadine (ALLEGRA) 180 MG tablet Take 1 tablet by mouth daily.   fluticasone  (FLONASE ) 50 MCG/ACT nasal spray Place 2 sprays into both nostrils daily.   glipiZIDE  (GLUCOTROL ) 5 MG tablet Take 1 tablet (5 mg total) by mouth daily before breakfast.   levothyroxine  (SYNTHROID ) 50 MCG tablet TAKE (1) TABLET BY MOUTH EVERY DAY BEFORE BREAKFAST   lisinopril -hydrochlorothiazide  (ZESTORETIC ) 20-12.5 MG tablet TAKE (1) TABLET BY MOUTH EVERY DAY   Multiple Vitamins-Minerals (CENTRUM SILVER PO) Take 1 tablet by mouth daily.   omeprazole  (PRILOSEC) 20 MG capsule TAKE (1) CAPSULE BY MOUTH EVERY DAY   predniSONE  (DELTASONE ) 20 MG tablet Take 2 tablets (40 mg total) by mouth daily with breakfast.   simvastatin  (ZOCOR ) 20 MG tablet TAKE (1) TABLET BY MOUTH EVERY DAY AT 6:00 IN THE EVENING   TURMERIC PO Take 1 tablet by mouth daily.        11/09/2023   10:46 AM 10/02/2023    9:13 AM 07/09/2023    9:49 AM 03/06/2023    3:21 PM  GAD 7 : Generalized Anxiety Score  Nervous, Anxious, on Edge 0 0 0 0  Control/stop worrying 0 0 0 0  Worry too much - different things 0 0 0 0  Trouble relaxing 0 0 0 0  Restless 0 0 0 0  Easily annoyed or irritable 0 0 0 0  Afraid - awful  might happen 0 0 0 0  Total GAD 7 Score 0 0 0 0  Anxiety Difficulty Not difficult at all Not difficult at all Not difficult at all Not difficult at all       11/09/2023   10:45 AM 10/02/2023    9:12 AM 07/09/2023    9:49 AM  Depression screen PHQ 2/9  Decreased Interest 0 0 0  Down, Depressed, Hopeless 0 0 0  PHQ - 2 Score 0 0 0  Altered sleeping 0 0 0  Tired, decreased energy 0 0 2  Change in appetite 0 0 0  Feeling bad or failure about yourself  0 0 0  Trouble concentrating 0 0 0  Moving slowly or fidgety/restless 0 0 0  Suicidal thoughts 0 0 0  PHQ-9 Score 0 0 2  Difficult doing work/chores Not difficult at all Not difficult  at all Not difficult at all    BP Readings from Last 3 Encounters:  11/09/23 128/76  10/02/23 108/66  07/09/23 112/78    Physical Exam Constitutional:      Appearance: Normal appearance.  HENT:     Left Ear: Ear canal normal. No drainage, swelling or tenderness. Tympanic membrane is retracted. Tympanic membrane is not erythematous.  Cardiovascular:     Rate and Rhythm: Normal rate and regular rhythm.     Heart sounds: No murmur heard. Pulmonary:     Effort: Pulmonary effort is normal.     Breath sounds: No wheezing or rhonchi.  Musculoskeletal:        General: No swelling, tenderness or deformity. Normal range of motion.     Cervical back: Normal range of motion.  Lymphadenopathy:     Cervical: No cervical adenopathy.  Skin:    General: Skin is warm and dry.  Neurological:     General: No focal deficit present.     Mental Status: She is alert.    Diabetic Foot Exam - Simple   Simple Foot Form Diabetic Foot exam was performed with the following findings: Yes 11/09/2023 11:40 AM  Visual Inspection No deformities, no ulcerations, no other skin breakdown bilaterally: Yes Sensation Testing Intact to touch and monofilament testing bilaterally: Yes Pulse Check Posterior Tibialis and Dorsalis pulse intact bilaterally: Yes Comments       Wt Readings from Last 3 Encounters:  11/09/23 157 lb 4 oz (71.3 kg)  10/02/23 155 lb (70.3 kg)  07/09/23 158 lb 6.4 oz (71.8 kg)    BP 128/76   Pulse 63   Ht 5\' 5"  (1.651 m)   Wt 157 lb 4 oz (71.3 kg)   SpO2 97%   BMI 26.17 kg/m   Assessment and Plan:  Problem List Items Addressed This Visit       Unprioritized   Benign hypertension - Primary (Chronic)   Blood pressure is well controlled.  Current medications lisinopril  hct. Will continue same regimen along with efforts to limit dietary sodium.       Hyperlipidemia associated with type 2 diabetes mellitus (HCC) (Chronic)   LDL is  Lab Results  Component Value Date   LDLCALC 87 07/10/2023   Current regimen is simvastatin  .  No medication side effects noted. Goal LDL is <70.       Relevant Medications   glipiZIDE  (GLUCOTROL ) 5 MG tablet   Type II diabetes mellitus with complication (HCC) (Chronic)   Blood sugars have been stable.  No recent hypoglycemic events requiring assistance. Currently medications are none - diet only. Lab Results  Component Value Date   HGBA1C 7.7 (H) 07/10/2023   Last visit prednisone  was started for recurrence of PMR. A1C today = 8.8.  will start Glipizide  5 mg daily       Relevant Medications   glipiZIDE  (GLUCOTROL ) 5 MG tablet   Other Relevant Orders   POCT glycosylated hemoglobin (Hb A1C) (Completed)   PMR (polymyalgia rheumatica) (HCC) (Chronic)   Recurrent symptoms last visit. Doing great since starting 40 mg and tapering down to 20 mg currently. Rec reducing dose to 10 mg daily. Referred to Rheum - appointment in one month.      Other Visit Diagnoses       Long term current use of oral hypoglycemic drug         Non-recurrent acute serous otitis media of left ear       completed drops from ENT clinically resolved.  Return in about 3 months (around 02/08/2024) for DM, HTN.    Sheron Dixons, MD [facility]  [place]

## 2023-11-09 NOTE — Patient Instructions (Addendum)
 Take glipizide  5 mg with breakfast daily.  Reduce daily prednisone  dose to 10 mg daily.

## 2023-11-09 NOTE — Assessment & Plan Note (Addendum)
 Blood sugars have been stable.  No recent hypoglycemic events requiring assistance. Currently medications are none - diet only. Lab Results  Component Value Date   HGBA1C 7.7 (H) 07/10/2023   Last visit prednisone  was started for recurrence of PMR. A1C today = 8.8.  will start Glipizide  5 mg daily

## 2023-11-09 NOTE — Assessment & Plan Note (Signed)
 LDL is  Lab Results  Component Value Date   LDLCALC 87 07/10/2023   Current regimen is simvastatin  .  No medication side effects noted. Goal LDL is <70.

## 2023-11-09 NOTE — Assessment & Plan Note (Addendum)
 Recurrent symptoms last visit. Doing great since starting 40 mg and tapering down to 20 mg currently. Rec reducing dose to 10 mg daily. Referred to Rheum - appointment in one month.

## 2023-11-09 NOTE — Assessment & Plan Note (Signed)
 Blood pressure is well controlled.  Current medications lisinopril hct. Will continue same regimen along with efforts to limit dietary sodium.

## 2023-12-18 ENCOUNTER — Other Ambulatory Visit: Payer: Self-pay | Admitting: Internal Medicine

## 2023-12-18 DIAGNOSIS — E785 Hyperlipidemia, unspecified: Secondary | ICD-10-CM

## 2023-12-28 ENCOUNTER — Other Ambulatory Visit: Payer: Self-pay | Admitting: Internal Medicine

## 2023-12-28 DIAGNOSIS — E039 Hypothyroidism, unspecified: Secondary | ICD-10-CM

## 2023-12-29 NOTE — Telephone Encounter (Signed)
 Requested Prescriptions  Pending Prescriptions Disp Refills   levothyroxine  (SYNTHROID ) 50 MCG tablet [Pharmacy Med Name: LEVOTHYROXINE  SODIUM 50MCG TABLET] 90 tablet 0    Sig: TAKE (1) TABLET BY MOUTH EVERY DAY BEFORE BREAKFAST     Endocrinology:  Hypothyroid Agents Passed - 12/29/2023 12:41 PM      Passed - TSH in normal range and within 360 days    TSH  Date Value Ref Range Status  07/10/2023 2.040 0.450 - 4.500 uIU/mL Final         Passed - Valid encounter within last 12 months    Recent Outpatient Visits           1 month ago Benign hypertension   [place] [facility] at [facility], Chales [place], MD   2 months ago PMR (polymyalgia rheumatica) [facility])   [place] [facility] at [facility], Chales [place], MD

## 2024-01-14 LAB — HM DIABETES EYE EXAM

## 2024-01-26 ENCOUNTER — Other Ambulatory Visit: Payer: Self-pay | Admitting: Internal Medicine

## 2024-01-26 DIAGNOSIS — J309 Allergic rhinitis, unspecified: Secondary | ICD-10-CM

## 2024-01-28 ENCOUNTER — Other Ambulatory Visit: Payer: Self-pay | Admitting: Internal Medicine

## 2024-01-28 DIAGNOSIS — K219 Gastro-esophageal reflux disease without esophagitis: Secondary | ICD-10-CM

## 2024-01-28 NOTE — Telephone Encounter (Signed)
 Requested Prescriptions  Pending Prescriptions Disp Refills   fluticasone  (FLONASE ) 50 MCG/ACT nasal spray [Pharmacy Med Name: FLUTICASONE  PROPIONATE SUSPENSION] 48 g 1    Sig: PLACE TWO (2) SPRAYS INTO BOTH NOSTRILS DAILY.     Ear, Nose, and Throat: Nasal Preparations - Corticosteroids Passed - 01/28/2024 12:36 PM      Passed - Valid encounter within last 12 months    Recent Outpatient Visits           2 months ago Benign hypertension   [place] [facility] at [facility], Leita DEL, MD   3 months ago PMR (polymyalgia rheumatica) [facility])   [place] [facility] at [facility], Leita DEL, MD

## 2024-01-29 NOTE — Telephone Encounter (Signed)
 Requested Prescriptions  Pending Prescriptions Disp Refills   omeprazole  (PRILOSEC) 20 MG capsule [Pharmacy Med Name: OMEPRAZOLE  20MG  CAPSULE DR] 90 capsule 1    Sig: TAKE (1) CAPSULE BY MOUTH EVERY DAY     Gastroenterology: Proton Pump Inhibitors Passed - 01/29/2024  3:24 PM      Passed - Valid encounter within last 12 months    Recent Outpatient Visits           2 months ago Benign hypertension   [place] [facility] at [facility], Leita DEL, MD   3 months ago PMR (polymyalgia rheumatica) [facility])   [place] [facility] at [facility], Leita DEL, MD

## 2024-02-08 ENCOUNTER — Ambulatory Visit: Admitting: Internal Medicine

## 2024-02-08 ENCOUNTER — Encounter: Payer: Self-pay | Admitting: Internal Medicine

## 2024-02-08 VITALS — BP 124/74 | HR 74 | Ht 65.0 in | Wt 166.0 lb

## 2024-02-08 DIAGNOSIS — M353 Polymyalgia rheumatica: Secondary | ICD-10-CM | POA: Diagnosis not present

## 2024-02-08 DIAGNOSIS — R002 Palpitations: Secondary | ICD-10-CM | POA: Diagnosis not present

## 2024-02-08 DIAGNOSIS — E118 Type 2 diabetes mellitus with unspecified complications: Secondary | ICD-10-CM | POA: Diagnosis not present

## 2024-02-08 DIAGNOSIS — I10 Essential (primary) hypertension: Secondary | ICD-10-CM | POA: Diagnosis not present

## 2024-02-08 DIAGNOSIS — Z7984 Long term (current) use of oral hypoglycemic drugs: Secondary | ICD-10-CM

## 2024-02-08 NOTE — Progress Notes (Signed)
 Date:  02/08/2024   Name:  Jill Dunlap   DOB:  1934-08-25   MRN:  [id number]   Chief Complaint: Diabetes and Hypertension  Diabetes She presents for her follow-up diabetic visit. She has type 2 diabetes mellitus. Pertinent negatives for hypoglycemia include no headaches, nervousness/anxiousness or tremors. Pertinent negatives for diabetes include no chest pain, no fatigue, no polydipsia and no polyuria. Current diabetic treatment includes oral agent (monotherapy) (glipizide  started last visit).  Hypertension This is a chronic problem. The problem is controlled. Associated symptoms include palpitations. Pertinent negatives include no chest pain, headaches or shortness of breath. Past treatments include ACE inhibitors and diuretics.  Palpitations  This is a recurrent problem. The current episode started more than 1 month ago. The problem occurs intermittently (2-3 times per month). The problem has been unchanged. On average, each episode lasts 15 seconds. Nothing aggravates the symptoms. Pertinent negatives include no anxiety, chest pain, coughing, fever, numbness or shortness of breath. She has tried nothing for the symptoms. There are no known risk factors.  PMR - now on 6 mg prednisone  per day with control of symptoms.  She had recurrence of myalgia on 5 mg.  Seeing Rheumatology.  Review of Systems  Constitutional:  Negative for appetite change, fatigue, fever and unexpected weight change.  HENT:  Negative for tinnitus and trouble swallowing.   Eyes:  Negative for visual disturbance.  Respiratory:  Negative for cough, chest tightness and shortness of breath.   Cardiovascular:  Positive for palpitations. Negative for chest pain and leg swelling.  Gastrointestinal:  Negative for abdominal pain.  Endocrine: Negative for polydipsia and polyuria.  Genitourinary:  Negative for dysuria and hematuria.  Musculoskeletal:  Positive for myalgias. Negative for arthralgias and joint swelling.   Skin:  Negative for rash.  Neurological:  Negative for tremors, numbness and headaches.  Psychiatric/Behavioral:  Negative for dysphoric mood and sleep disturbance. The patient is not nervous/anxious.      Lab Results  Component Value Date   NA 140 07/10/2023   K 4.3 07/10/2023   CO2 22 07/10/2023   GLUCOSE 148 (H) 07/10/2023   BUN 20 07/10/2023   CREATININE 0.84 07/10/2023   CALCIUM 9.5 07/10/2023   EGFR 68 07/10/2023   GFRNONAA >60 07/03/2022   Lab Results  Component Value Date   CHOL 155 07/10/2023   HDL 47 07/10/2023   LDLCALC 87 07/10/2023   TRIG 114 07/10/2023   CHOLHDL 3.3 07/10/2023   Lab Results  Component Value Date   TSH 2.040 07/10/2023   Lab Results  Component Value Date   HGBA1C 8.8 (A) 11/09/2023   Lab Results  Component Value Date   WBC 5.6 07/10/2023   HGB 11.7 07/10/2023   HCT 37.1 07/10/2023   MCV 86 07/10/2023   PLT 247 07/10/2023   Lab Results  Component Value Date   ALT 14 07/10/2023   AST 19 07/10/2023   ALKPHOS 53 07/10/2023   BILITOT 0.3 07/10/2023   No results found for: MARIEN BOLLS, VD25OH   Patient Active Problem List   Diagnosis Date Noted   Palpitations 02/08/2024   Osteoarthritis of fingers of both hands 07/09/2023   Swelling of digit of right hand 05/21/2023   Chronic pain of right wrist 08/07/2022   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   Left lumbar radiculopathy 03/13/2021   Piriformis syndrome, left 02/12/2021   Aortic atherosclerosis (HCC) 08/01/2020   Shoulder impingement 02/02/2020   PMR (polymyalgia rheumatica) (HCC) 02/02/2020   Primary osteoarthritis of  left hip 01/24/2020   Diffuse photodamage of skin 02/13/2016   Basal cell carcinoma 01/11/2016   Gastroesophageal reflux disease without esophagitis 07/03/2015   Type II diabetes mellitus with complication (HCC) 03/15/2015   Acquired hypothyroidism 10/31/2014   Allergic rhinitis 10/31/2014   Benign hypertension 10/31/2014   CD (celiac disease)  10/31/2014   Hyperlipidemia associated with type 2 diabetes mellitus (HCC) 10/31/2014   OP (osteoporosis) 10/31/2014    Allergies  Allergen Reactions   Ciprofloxacin  Hcl Rash    Past Surgical History:  Procedure Laterality Date   ABDOMINAL HYSTERECTOMY  1984   for bleeding   Basil cell     BILATERAL SALPINGOOPHORECTOMY  1984   CATARACT EXTRACTION W/PHACO Right 11/24/2016   Procedure: CATARACT EXTRACTION PHACO AND INTRAOCULAR LENS PLACEMENT (IOC) Right;  Surgeon: Mittie Gaskin, MD;  Location: [facility];  Service: Ophthalmology;  Laterality: Right;   CATARACT EXTRACTION W/PHACO Left 02/04/2017   Procedure: CATARACT EXTRACTION PHACO AND INTRAOCULAR LENS PLACEMENT (IOC)  Left;  Surgeon: Mittie Gaskin, MD;  Location: [facility];  Service: Ophthalmology;  Laterality: Left;   ORIF WRIST FRACTURE Left 2015   TYMPANOPLASTY  1992    Social History   Tobacco Use   Smoking status: Never   Smokeless tobacco: Never  Vaping Use   Vaping status: Never Used  Substance Use Topics   Alcohol use: Never   Drug use: Never     Medication list has been reviewed and updated.  Current Meds  Medication Sig   azelastine  (ASTELIN ) 0.1 % nasal spray INSTILL 2 SPRAYS IN EACH NOSTRIL TWICE DAILY AS DIRECTED   Biotin 2500 MCG CAPS Take 1 capsule by mouth daily.   calcium-vitamin D (OSCAL WITH D) 500-200 MG-UNIT tablet Take 1 tablet by mouth 2 (two) times daily.   Cranberry 500 MG CAPS Take 1 capsule by mouth daily.    fexofenadine (ALLEGRA) 180 MG tablet Take 1 tablet by mouth daily.   fluticasone  (FLONASE ) 50 MCG/ACT nasal spray PLACE TWO (2) SPRAYS INTO BOTH NOSTRILS DAILY.   glipiZIDE  (GLUCOTROL ) 5 MG tablet Take 1 tablet (5 mg total) by mouth daily before breakfast.   levothyroxine  (SYNTHROID ) 50 MCG tablet TAKE (1) TABLET BY MOUTH EVERY DAY BEFORE BREAKFAST   lisinopril -hydrochlorothiazide  (ZESTORETIC ) 20-12.5 MG tablet TAKE (1) TABLET BY MOUTH EVERY DAY   Multiple  Vitamins-Minerals (CENTRUM SILVER PO) Take 1 tablet by mouth daily.   omeprazole  (PRILOSEC) 20 MG capsule TAKE (1) CAPSULE BY MOUTH EVERY DAY   predniSONE  (DELTASONE ) 20 MG tablet Take 2 tablets (40 mg total) by mouth daily with breakfast.   simvastatin  (ZOCOR ) 20 MG tablet TAKE (1) TABLET BY MOUTH EVERY DAY AT 6:00 IN THE EVENING   TURMERIC PO Take 1 tablet by mouth daily.        02/08/2024    1:22 PM 11/09/2023   10:46 AM 10/02/2023    9:13 AM 07/09/2023    9:49 AM  GAD 7 : Generalized Anxiety Score  Nervous, Anxious, on Edge 0 0 0 0  Control/stop worrying 0 0 0 0  Worry too much - different things 0 0 0 0  Trouble relaxing 0 0 0 0  Restless 0 0 0 0  Easily annoyed or irritable 0 0 0 0  Afraid - awful might happen 0 0 0 0  Total GAD 7 Score 0 0 0 0  Anxiety Difficulty Not difficult at all Not difficult at all Not difficult at all Not difficult at all  02/08/2024    1:22 PM 11/09/2023   10:45 AM 10/02/2023    9:12 AM  Depression screen PHQ 2/9  Decreased Interest 0 0 0  Down, Depressed, Hopeless 0 0 0  PHQ - 2 Score 0 0 0  Altered sleeping 0 0 0  Tired, decreased energy 0 0 0  Change in appetite 0 0 0  Feeling bad or failure about yourself  0 0 0  Trouble concentrating 0 0 0  Moving slowly or fidgety/restless 0 0 0  Suicidal thoughts 0 0 0  PHQ-9 Score 0 0 0  Difficult doing work/chores Not difficult at all Not difficult at all Not difficult at all    BP Readings from Last 3 Encounters:  02/08/24 124/74  11/09/23 128/76  10/02/23 108/66    Physical Exam Vitals and nursing note reviewed.  Constitutional:      General: She is not in acute distress.    Appearance: She is well-developed.  HENT:     Head: Normocephalic and atraumatic.  Cardiovascular:     Rate and Rhythm: Normal rate and regular rhythm.     Heart sounds: No murmur heard. Pulmonary:     Effort: Pulmonary effort is normal. No respiratory distress.     Breath sounds: No wheezing or rhonchi.   Musculoskeletal:     Cervical back: Normal range of motion.     Right lower leg: No edema.     Left lower leg: No edema.  Skin:    General: Skin is warm and dry.     Capillary Refill: Capillary refill takes less than 2 seconds.     Findings: No rash.  Neurological:     General: No focal deficit present.     Mental Status: She is alert and oriented to person, place, and time.  Psychiatric:        Mood and Affect: Mood normal.        Behavior: Behavior normal.     Wt Readings from Last 3 Encounters:  02/08/24 166 lb (75.3 kg)  11/09/23 157 lb 4 oz (71.3 kg)  10/02/23 155 lb (70.3 kg)    BP 124/74   Pulse 74   Ht 5' 5 (1.651 m)   Wt 166 lb (75.3 kg)   SpO2 98%   BMI 27.62 kg/m   Assessment and Plan:  Problem List Items Addressed This Visit       Unprioritized   Benign hypertension (Chronic)   Blood pressure is well controlled.  Current medications lisinopril  and hydrochlorothiazide   Will continue same regimen along with efforts to limit dietary sodium.       Type II diabetes mellitus with complication (HCC) - Primary (Chronic)   Blood sugars have been stable.  No recent hypoglycemic events requiring assistance. Currently medications are glipizide .  Started due to ongoing steroid therapy and high A1C. Lab Results  Component Value Date   HGBA1C 8.8 (A) 11/09/2023   Last visit glipizide  was added. A1C today = 7.1.  Will continue same medications.       PMR (polymyalgia rheumatica) (HCC) (Chronic)   Seeing Rheum due to recurrence and ongoing need for prednisone . Now on 6 mg daily - had symptoms on 5 mg daily      Palpitations   Occurring rarely without associated symptoms.  Will monitor, and if worsening, will get Zio monitor.  Currently not often enough to reliably catch an arrhythmia.      Other Visit Diagnoses       Long  term current use of oral hypoglycemic drug           Return in about 4 months (around 06/10/2024) for DM, HTN.    Leita HILARIO Adie, MD [facility] [place]

## 2024-02-08 NOTE — Assessment & Plan Note (Signed)
 Blood pressure is well controlled.  Current medications lisinopril and hydrochlorothiazide. Will continue same regimen along with efforts to limit dietary sodium.

## 2024-02-08 NOTE — Patient Instructions (Signed)
 Call [facility] to schedule your mammogram at [phone].

## 2024-02-08 NOTE — Assessment & Plan Note (Addendum)
 Blood sugars have been stable.  No recent hypoglycemic events requiring assistance. Currently medications are glipizide .  Started due to ongoing steroid therapy and high A1C. Lab Results  Component Value Date   HGBA1C 8.8 (A) 11/09/2023   Last visit glipizide  was added. A1C today = 7.1.  Will continue same medications.

## 2024-02-08 NOTE — Assessment & Plan Note (Signed)
 Occurring rarely without associated symptoms.  Will monitor, and if worsening, will get Zio monitor.  Currently not often enough to reliably catch an arrhythmia.

## 2024-02-08 NOTE — Assessment & Plan Note (Signed)
 Seeing Rheum due to recurrence and ongoing need for prednisone . Now on 6 mg daily - had symptoms on 5 mg daily

## 2024-02-19 DIAGNOSIS — H663X3 Other chronic suppurative otitis media, bilateral: Secondary | ICD-10-CM | POA: Diagnosis not present

## 2024-02-19 DIAGNOSIS — H6983 Other specified disorders of Eustachian tube, bilateral: Secondary | ICD-10-CM | POA: Diagnosis not present

## 2024-02-22 ENCOUNTER — Other Ambulatory Visit: Payer: Self-pay | Admitting: Internal Medicine

## 2024-02-22 DIAGNOSIS — I10 Essential (primary) hypertension: Secondary | ICD-10-CM

## 2024-02-23 NOTE — Telephone Encounter (Signed)
 Requested Prescriptions  Refused Prescriptions Disp Refills   lisinopril -hydrochlorothiazide  (ZESTORETIC ) 20-12.5 MG tablet [Pharmacy Med Name: LISINOPRIL /HYDROCHLOROTHIAZIDE  20-12.5 TABLET] 90 tablet 3    Sig: TAKE (1) TABLET BY MOUTH EVERY DAY     Cardiovascular:  ACEI + Diuretic Combos Failed - 02/23/2024  5:51 PM      Failed - Na in normal range and within 180 days    Sodium  Date Value Ref Range Status  07/10/2023 140 134 - 144 mmol/L Final         Failed - K in normal range and within 180 days    Potassium  Date Value Ref Range Status  07/10/2023 4.3 3.5 - 5.2 mmol/L Final         Failed - Cr in normal range and within 180 days    Creatinine, Ser  Date Value Ref Range Status  07/10/2023 0.84 0.57 - 1.00 mg/dL Final         Failed - eGFR is 30 or above and within 180 days    GFR calc Af Amer  Date Value Ref Range Status  01/27/2020 87 >59 mL/min/1.73 Final    Comment:    **[facility] currently reports eGFR in compliance with the current**   recommendations of the [employer]. [facility] will   update reporting as new guidelines are published from the NKF-ASN   Task force.    GFR, Estimated  Date Value Ref Range Status  07/03/2022 >60 >60 mL/min Final    Comment:    (NOTE) Calculated using the CKD-EPI Creatinine Equation (2021)    eGFR  Date Value Ref Range Status  07/10/2023 68 >59 mL/min/1.73 Final         Passed - Patient is not pregnant      Passed - Last BP in normal range    BP Readings from Last 1 Encounters:  02/08/24 124/74         Passed - Valid encounter within last 6 months    Recent Outpatient Visits           2 weeks ago Type II diabetes mellitus with complication [facility])   [place] [facility] at [facility], Leita DEL, MD   3 months ago Benign hypertension   [place] [facility] Medicine at [facility], Leita DEL, MD   4 months ago PMR (polymyalgia rheumatica) [facility])    [place] [facility] Medicine at [facility], Leita DEL, MD

## 2024-03-09 DIAGNOSIS — Z85828 Personal history of other malignant neoplasm of skin: Secondary | ICD-10-CM | POA: Diagnosis not present

## 2024-03-09 DIAGNOSIS — D2271 Melanocytic nevi of right lower limb, including hip: Secondary | ICD-10-CM | POA: Diagnosis not present

## 2024-03-09 DIAGNOSIS — D2261 Melanocytic nevi of right upper limb, including shoulder: Secondary | ICD-10-CM | POA: Diagnosis not present

## 2024-03-09 DIAGNOSIS — D2272 Melanocytic nevi of left lower limb, including hip: Secondary | ICD-10-CM | POA: Diagnosis not present

## 2024-03-09 DIAGNOSIS — D2262 Melanocytic nevi of left upper limb, including shoulder: Secondary | ICD-10-CM | POA: Diagnosis not present

## 2024-03-17 ENCOUNTER — Ambulatory Visit
Admission: RE | Admit: 2024-03-17 | Discharge: 2024-03-17 | Disposition: A | Discharge status: Home / Self Care | Source: Ambulatory Visit | Attending: Internal Medicine | Admitting: Internal Medicine

## 2024-03-17 DIAGNOSIS — Z1231 Encounter for screening mammogram for malignant neoplasm of breast: Secondary | ICD-10-CM | POA: Insufficient documentation

## 2024-03-25 ENCOUNTER — Telehealth: Payer: Self-pay | Admitting: Internal Medicine

## 2024-03-25 NOTE — Telephone Encounter (Signed)
 Copied from CRM [id number]. Topic: Medicare AWV >> Mar 25, 2024 11:20 AM Nathanel DEL wrote: Reason for CRM: LVM 03/24/24 to change awv appt from office visit to phone visit due to [facility] working remote.   Nathanel Paschal; Care Guide Ambulatory Clinical Support [place] [facility] Direct Dial: [phone]

## 2024-04-20 DIAGNOSIS — M816 Localized osteoporosis [Lequesne]: Secondary | ICD-10-CM | POA: Diagnosis not present

## 2024-04-20 DIAGNOSIS — E559 Vitamin D deficiency, unspecified: Secondary | ICD-10-CM | POA: Diagnosis not present

## 2024-04-20 DIAGNOSIS — M353 Polymyalgia rheumatica: Secondary | ICD-10-CM | POA: Diagnosis not present

## 2024-04-20 DIAGNOSIS — Z79899 Other long term (current) drug therapy: Secondary | ICD-10-CM | POA: Diagnosis not present

## 2024-04-25 ENCOUNTER — Other Ambulatory Visit: Payer: Self-pay | Admitting: Internal Medicine

## 2024-04-25 DIAGNOSIS — K219 Gastro-esophageal reflux disease without esophagitis: Secondary | ICD-10-CM

## 2024-04-25 DIAGNOSIS — J309 Allergic rhinitis, unspecified: Secondary | ICD-10-CM

## 2024-04-26 ENCOUNTER — Other Ambulatory Visit: Payer: Self-pay | Admitting: Internal Medicine

## 2024-04-26 DIAGNOSIS — E118 Type 2 diabetes mellitus with unspecified complications: Secondary | ICD-10-CM

## 2024-04-26 NOTE — Telephone Encounter (Signed)
 Requested Prescriptions  Refused Prescriptions Disp Refills   fluticasone  (FLONASE ) 50 MCG/ACT nasal spray [Pharmacy Med Name: FLUTICASONE  PROPIONATE 50MCG SUSPENSION]  1    Sig: PLACE TWO (2) SPRAYS INTO BOTH NOSTRILS DAILY     Ear, Nose, and Throat: Nasal Preparations - Corticosteroids Passed - 04/26/2024  4:51 PM      Passed - Valid encounter within last 12 months    Recent Outpatient Visits           2 months ago Type II diabetes mellitus with complication [facility])   [place] [facility] Medicine at [facility], Leita DEL, MD   5 months ago Benign hypertension   [place] [facility] Sports Medicine at [facility], Leita DEL, MD   6 months ago PMR (polymyalgia rheumatica)   [place] [facility] Medicine at [facility], Leita DEL, MD               omeprazole  (PRILOSEC) 20 MG capsule [Pharmacy Med Name: OMEPRAZOLE  DR 20MG  CAPSULE DR] 90 capsule 1    Sig: TAKE (1) CAPSULE BY MOUTH EVERY DAY     Gastroenterology: Proton Pump Inhibitors Passed - 04/26/2024  4:51 PM      Passed - Valid encounter within last 12 months    Recent Outpatient Visits           2 months ago Type II diabetes mellitus with complication [facility])   [place] [facility] Medicine at [facility], Leita DEL, MD   5 months ago Benign hypertension   [place] [facility] Care & Sports Medicine at [facility], Leita DEL, MD   6 months ago PMR (polymyalgia rheumatica)   [facility] Care & [facility] Medicine at [facility], Leita DEL, MD

## 2024-04-28 NOTE — Telephone Encounter (Signed)
 Requested Prescriptions  Pending Prescriptions Disp Refills   glipiZIDE  (GLUCOTROL ) 5 MG tablet [Pharmacy Med Name: GLIPIZIDE  5MG  TABLET] 90 tablet 0    Sig: TAKE ONE (1) TABLET BY MOUTH DAILY BEFORE BREAKFAST.     Endocrinology:  Diabetes - Sulfonylureas Failed - 04/28/2024 11:20 AM      Failed - HBA1C is between 0 and 7.9 and within 180 days    Hemoglobin A1C  Date Value Ref Range Status  11/09/2023 8.8 (A) 4.0 - 5.6 % Final   Hgb A1c MFr Bld  Date Value Ref Range Status  07/10/2023 7.7 (H) 4.8 - 5.6 % Final    Comment:             Prediabetes: 5.7 - 6.4          Diabetes: >6.4          Glycemic control for adults with diabetes: <7.0          Passed - Cr in normal range and within 360 days    Creatinine, Ser  Date Value Ref Range Status  07/10/2023 0.84 0.57 - 1.00 mg/dL Final         Passed - Valid encounter within last 6 months    Recent Outpatient Visits           2 months ago Type II diabetes mellitus with complication [facility])   [place] [facility] at [facility], Leita DEL, MD   5 months ago Benign hypertension    Primary Care & Sports Medicine at [facility], Leita DEL, MD   6 months ago PMR (polymyalgia rheumatica)   [facility] & [facility] Medicine at [facility], Leita DEL, MD

## 2024-05-04 ENCOUNTER — Ambulatory Visit: Payer: Self-pay

## 2024-05-04 VITALS — BP 130/76 | Ht 65.0 in | Wt 166.2 lb

## 2024-05-04 DIAGNOSIS — Z0000 Encounter for general adult medical examination without abnormal findings: Secondary | ICD-10-CM

## 2024-05-04 DIAGNOSIS — Z23 Encounter for immunization: Secondary | ICD-10-CM | POA: Diagnosis not present

## 2024-05-04 NOTE — Patient Instructions (Addendum)
 Jill Dunlap,  Thank you for taking the time for your Medicare Wellness Visit. I appreciate your continued commitment to your health goals. Please review the care plan we discussed, and feel free to reach out if I can assist you further.  Medicare recommends these wellness visits once per year to help you and your care team stay ahead of potential health issues. These visits are designed to focus on prevention, allowing your provider to concentrate on managing your acute and chronic conditions during your regular appointments.  Please note that Annual Wellness Visits do not include a physical exam. Some assessments may be limited, especially if the visit was conducted virtually. If needed, we may recommend a separate in-person follow-up with your provider.  Ongoing Care Seeing your primary care provider every 3 to 6 months helps us  monitor your health and provide consistent, personalized care.   Referrals If a referral was made during today's visit and you haven't received any updates within two weeks, please contact the referred provider directly to check on the status.  Recommended Screenings:  Health Maintenance  Topic Date Due   DTaP/Tdap/Td vaccine (2 - Tdap) 10/31/2018   COVID-19 Vaccine (6 - 2025-26 season) 03/21/2024   Hemoglobin A1C  05/10/2024   Complete foot exam   11/08/2024   Eye exam for diabetics  01/13/2025   Breast Cancer Screening  03/17/2025   Medicare Annual Wellness Visit  05/04/2025   Pneumococcal Vaccine for age over 20  Completed   Flu Shot  Completed   DEXA scan (bone density measurement)  Completed   Zoster (Shingles) Vaccine  Completed   Meningitis B Vaccine  Aged Out   FLU SHOT GIVEN  Advance Care Planning is important because it: Ensures you receive medical care that aligns with your values, goals, and preferences. Provides guidance to your family and loved ones, reducing the emotional burden of decision-making during critical moments.  Vision:  Annual vision screenings are recommended for early detection of glaucoma, cataracts, and diabetic retinopathy. These exams can also reveal signs of chronic conditions such as diabetes and high blood pressure.  Dental: Annual dental screenings help detect early signs of oral cancer, gum disease, and other conditions linked to overall health, including heart disease and diabetes.  Please see the attached documents for additional preventive care recommendations.    NEXT AWV 05/17/25 @ 12:40 PM IN PERSON

## 2024-05-04 NOTE — Progress Notes (Signed)
 Subjective:   Jill Dunlap is a 89 y.o. who presents for a Medicare Wellness preventive visit.  As a reminder, Annual Wellness Visits don't include a physical exam, and some assessments may be limited, especially if this visit is performed virtually. We may recommend an in-person follow-up visit with your provider if needed.  Visit Complete: In person   Persons Participating in Visit: Patient.  AWV Questionnaire: No: Patient Medicare AWV questionnaire was not completed prior to this visit.  Cardiac Risk Factors include: advanced age (>69men, >23 women);diabetes mellitus;dyslipidemia;hypertension;sedentary lifestyle     Objective:    Today's Vitals   05/04/24 1525  BP: 130/76  Weight: 166 lb 3.2 oz (75.4 kg)  Height: 5' 5 (1.651 m)   Body mass index is 27.66 kg/m.     05/04/2024    3:40 PM 04/29/2023    3:14 PM 12/13/2022   11:13 AM 04/16/2022    3:21 PM 04/15/2021    3:42 PM 03/21/2021   10:53 AM 04/11/2020   11:48 AM  Advanced Directives  Does Patient Have a Medical Advance Directive? No No Yes Yes Yes Yes Yes  Type of Surveyor, minerals;Living will Living will Healthcare Power of [place];Living will  Healthcare Power of [place];Living will  Does patient want to make changes to medical advance directive?      No - Patient declined   Copy of Healthcare Power of Attorney in Chart?     No - copy requested  No - copy requested  Would patient like information on creating a medical advance directive? No - Patient declined No - Patient declined         Current Medications (verified) Outpatient Encounter Medications as of 05/04/2024  Medication Sig   azelastine  (ASTELIN ) 0.1 % nasal spray INSTILL 2 SPRAYS IN EACH NOSTRIL TWICE DAILY AS DIRECTED   Biotin 2500 MCG CAPS Take 1 capsule by mouth daily.   calcium-vitamin D (OSCAL WITH D) 500-200 MG-UNIT tablet Take 1 tablet by mouth 2 (two) times daily.   cholecalciferol (VITAMIN D3) 25 MCG  (1000 UNIT) tablet Take 1,000 Units by mouth daily.   Cranberry 500 MG CAPS Take 1 capsule by mouth daily.    fexofenadine (ALLEGRA) 180 MG tablet Take 1 tablet by mouth daily.   fluticasone  (FLONASE ) 50 MCG/ACT nasal spray PLACE TWO (2) SPRAYS INTO BOTH NOSTRILS DAILY.   glipiZIDE  (GLUCOTROL ) 5 MG tablet TAKE ONE (1) TABLET BY MOUTH DAILY BEFORE BREAKFAST.   levothyroxine  (SYNTHROID ) 50 MCG tablet TAKE (1) TABLET BY MOUTH EVERY DAY BEFORE BREAKFAST   lisinopril -hydrochlorothiazide  (ZESTORETIC ) 20-12.5 MG tablet TAKE (1) TABLET BY MOUTH EVERY DAY   Multiple Vitamins-Minerals (CENTRUM SILVER PO) Take 1 tablet by mouth daily.   omeprazole  (PRILOSEC) 20 MG capsule TAKE (1) CAPSULE BY MOUTH EVERY DAY   predniSONE  (DELTASONE ) 20 MG tablet Take 2 tablets (40 mg total) by mouth daily with breakfast.   simvastatin  (ZOCOR ) 20 MG tablet TAKE (1) TABLET BY MOUTH EVERY DAY AT 6:00 IN THE EVENING   TURMERIC PO Take 1 tablet by mouth daily.    [DISCONTINUED] Omeprazole  (PRILOSEC PO) Take 1 tablet by mouth.   No facility-administered encounter medications on file as of 05/04/2024.    Allergies (verified) Ciprofloxacin  hcl   History: Past Medical History:  Diagnosis Date   Acid reflux    Allergy    Cancer (HCC)    skin ca on leg head and back   Celiac disease    Current  moderate episode of major depressive disorder without prior episode (HCC)    Gastroesophageal reflux disease without esophagitis    Hyperlipidemia    Hypertension    Hypothyroidism    OP (osteoporosis)    Piriformis syndrome, left 02/12/2021   Pre-diabetes    Primary osteoarthritis of left hip    Type II diabetes mellitus with complication (HCC)    Vertigo    no episodes in several years   Wears dentures    partial lower   Past Surgical History:  Procedure Laterality Date   ABDOMINAL HYSTERECTOMY  1984   for bleeding   Basil cell     BILATERAL SALPINGOOPHORECTOMY  1984   CATARACT EXTRACTION W/PHACO Right 11/24/2016    Procedure: CATARACT EXTRACTION PHACO AND INTRAOCULAR LENS PLACEMENT (IOC) Right;  Surgeon: Mittie Gaskin, MD;  Location: [facility];  Service: Ophthalmology;  Laterality: Right;   CATARACT EXTRACTION W/PHACO Left 02/04/2017   Procedure: CATARACT EXTRACTION PHACO AND INTRAOCULAR LENS PLACEMENT (IOC)  Left;  Surgeon: Mittie Gaskin, MD;  Location: [facility];  Service: Ophthalmology;  Laterality: Left;   ORIF WRIST FRACTURE Left 2015   TYMPANOPLASTY  1992   Family History  Problem Relation Age of Onset   Cancer Mother        oral   Hypertension Father    Heart disease Father    Breast cancer Neg Hx    Social History   Socioeconomic History   Marital status: Widowed    Spouse name: Not on file   Number of children: 2   Years of education: 16   Highest education level: Bachelor's degree (e.g., BA, AB, BS)  Occupational History   Occupation: Retired  Tobacco Use   Smoking status: Never   Smokeless tobacco: Never  Vaping Use   Vaping status: Never Used  Substance and Sexual Activity   Alcohol use: Never   Drug use: Never   Sexual activity: Not Currently    Partners: Male  Other Topics Concern   Not on file  Social History Narrative   Pt lives alone   Social Drivers of Health   Financial Resource Strain: Low Risk  (05/04/2024)   Overall Financial Resource Strain (CARDIA)    Difficulty of Paying Living Expenses: Not hard at all  Food Insecurity: No Food Insecurity (05/04/2024)   Hunger Vital Sign    Worried About Running Out of Food in the Last Year: Never true    Ran Out of Food in the Last Year: Never true  Transportation Needs: No Transportation Needs (05/04/2024)   PRAPARE - Administrator, Civil Service (Medical): No    Lack of Transportation (Non-Medical): No  Physical Activity: Insufficiently Active (05/04/2024)   Exercise Vital Sign    Days of Exercise per Week: 3 days    Minutes of Exercise per Session: 30 min  Stress:  No Stress Concern Present (05/04/2024)   [employer] of Occupational Health - Occupational Stress Questionnaire    Feeling of Stress: Not at all  Social Connections: Moderately Isolated (05/04/2024)   Social Connection and Isolation Panel    Frequency of Communication with Friends and Family: More than three times a week    Frequency of Social Gatherings with Friends and Family: Three times a week    Attends Religious Services: More than 4 times per year    Active Member of Clubs or Organizations: No    Attends Banker Meetings: Never    Marital Status: Widowed  Tobacco Counseling Counseling given: Not Answered    Clinical Intake:  Pre-visit preparation completed: Yes  Pain : No/denies pain     BMI - recorded: 27.66 Nutritional Status: BMI 25 -29 Overweight Nutritional Risks: None Diabetes: Yes CBG done?: No Did pt. bring in CBG monitor from home?: No  Lab Results  Component Value Date   HGBA1C 8.8 (A) 11/09/2023   HGBA1C 7.7 (H) 07/10/2023   HGBA1C 7.4 (H) 03/06/2023     How often do you need to have someone help you when you read instructions, pamphlets, or other written materials from your doctor or pharmacy?: 1 - Never  Interpreter Needed?: No  Information entered by :: Jill DAS, LPN   Activities of Daily Living     05/04/2024    3:42 PM  In your present state of health, do you have any difficulty performing the following activities:  Hearing? 1  Vision? 0  Difficulty concentrating or making decisions? 0  Walking or climbing stairs? 1  Comment GOES SLOWLY & HOLDS TO RAIL  Dressing or bathing? 0  Doing errands, shopping? 0  Preparing Food and eating ? N  Using the Toilet? N  In the past six months, have you accidently leaked urine? N  Do you have problems with loss of bowel control? Y  Managing your Medications? N  Managing your Finances? N  Housekeeping or managing your Housekeeping? N    Patient Care Team: Justus Leita DEL, MD as PCP - General (Internal Medicine) Jinny Carmine, MD as Consulting Physician (Gastroenterology) Hester Wolm PARAS, MD as Consulting Physician (Cardiology) Mittie Gaskin, MD as Referring Physician (Ophthalmology)  I have updated your Care Teams any recent Medical Services you may have received from other providers in the past year.     Assessment:   This is a routine wellness examination for Jill Dunlap.  Hearing/Vision screen Hearing Screening - Comments:: WEARS AIDS, BOTH EARS Vision Screening - Comments:: WEARS GLASSES FOR READING ALL DAY- DR.BRASINGTON- SEEN EVERY YEAR   Goals Addressed             This Visit's Progress    Cut out extra servings         Depression Screen     05/04/2024    3:39 PM 02/08/2024    1:22 PM 11/09/2023   10:45 AM 10/02/2023    9:12 AM 07/09/2023    9:49 AM 04/29/2023    3:13 PM 03/06/2023    3:20 PM  PHQ 2/9 Scores  PHQ - 2 Score 0 0 0 0 0 0 0  PHQ- 9 Score 0 0 0 0 2 0 2    Fall Risk     05/04/2024    3:41 PM 02/08/2024    1:22 PM 11/09/2023   10:45 AM 10/02/2023    9:12 AM 07/09/2023    9:49 AM  Fall Risk   Falls in the past year? 1 0 0 0 0  Number falls in past yr: 1 0 0 0 0  Injury with Fall? 0 0 0 0 0  Risk for fall due to :  No Fall Risks No Fall Risks No Fall Risks No Fall Risks  Follow up Falls evaluation completed;Falls prevention discussed Falls evaluation completed Falls evaluation completed Falls evaluation completed Falls evaluation completed    MEDICARE RISK AT HOME:  Medicare Risk at Home Any stairs in or around the home?: Yes If so, are there any without handrails?: No Home free of loose throw rugs in walkways, pet  beds, electrical cords, etc?: Yes Adequate lighting in your home to reduce risk of falls?: Yes Life alert?: Yes Use of a cane, walker or w/c?: No Grab bars in the bathroom?: Yes Shower chair or bench in shower?: No Elevated toilet seat or a handicapped toilet?: Yes  TIMED UP AND  GO:  Was the test performed?  Yes  Length of time to ambulate 10 feet: 4 sec Gait steady and fast without use of assistive device  Cognitive Function: 6CIT completed        05/04/2024    3:46 PM 04/29/2023    3:19 PM 04/16/2022    3:23 PM 01/26/2019   10:23 AM 11/02/2017    9:15 AM  6CIT Screen  What Year? 0 points 0 points 0 points 0 points 0 points  What month? 0 points 0 points 0 points 0 points 0 points  What time? 0 points 0 points 0 points 0 points 0 points  Count back from 20 0 points 0 points 0 points 0 points 0 points  Months in reverse 0 points 0 points 0 points 0 points 0 points  Repeat phrase 4 points 2 points 4 points 0 points 2 points  Total Score 4 points 2 points 4 points 0 points 2 points    Immunizations Immunization History  Administered Date(s) Administered   Fluad Quad(high Dose 65+) 04/25/2020, 03/27/2021, 04/16/2022   Fluad Trivalent(High Dose 65+) 04/29/2023   INFLUENZA, HIGH DOSE SEASONAL PF 04/27/2017, 04/28/2019   Influenza-Unspecified 04/27/2015, 04/30/2018   PFIZER Comirnaty(Gray Top)Covid-19 Tri-Sucrose Vaccine 12/04/2020   PFIZER(Purple Top)SARS-COV-2 Vaccination 07/27/2019, 08/17/2019, 03/30/2020   Pfizer Covid-19 Vaccine Bivalent Booster 35yrs & up 05/16/2021   Pneumococcal Conjugate-13 06/29/2014   Pneumococcal Polysaccharide-23 10/30/2008   Td 10/30/2008   Td (Adult), 2 Lf Tetanus Toxid, Preservative Free 10/30/2008   Zoster Recombinant(Shingrix) 04/07/2018, 06/09/2018   Zoster, Live 10/30/2008    Screening Tests Health Maintenance  Topic Date Due   DTaP/Tdap/Td (2 - Tdap) 10/31/2018   Influenza Vaccine  02/19/2024   COVID-19 Vaccine (6 - 2025-26 season) 03/21/2024   HEMOGLOBIN A1C  05/10/2024   FOOT EXAM  11/08/2024   OPHTHALMOLOGY EXAM  01/13/2025   Mammogram  03/17/2025   Medicare Annual Wellness (AWV)  05/04/2025   Pneumococcal Vaccine: 50+ Years  Completed   DEXA SCAN  Completed   Zoster Vaccines- Shingrix  Completed    Meningococcal B Vaccine  Aged Out    Health Maintenance Items Addressed: UP TO DATE ON SHOTS; FLU SHOT GIVEN; AGED OUT OF MAMMOGRAM, BDS & COLONOSCOPY  Additional Screening:  Vision Screening: Recommended annual ophthalmology exams for early detection of glaucoma and other disorders of the eye. Is the patient up to date with their annual eye exam?  Yes  Who is the provider or what is the name of the office in which the patient attends annual eye exams? DR.BRASINGTON  Dental Screening: Recommended annual dental exams for proper oral hygiene  Community Resource Referral / Chronic Care Management: CRR required this visit?  No   CCM required this visit?  No   Plan:    I have personally reviewed and noted the following in the patient's chart:   Medical and social history Use of alcohol, tobacco or illicit drugs  Current medications and supplements including opioid prescriptions. Patient is not currently taking opioid prescriptions. Functional ability and status Nutritional status Physical activity Advanced directives List of other physicians Hospitalizations, surgeries, and ER visits in previous 12 months Vitals Screenings to include cognitive,  depression, and falls Referrals and appointments  In addition, I have reviewed and discussed with patient certain preventive protocols, quality metrics, and best practice recommendations. A written personalized care plan for preventive services as well as general preventive health recommendations were provided to patient.   Jill GORMAN Das, LPN   [id number]   After Visit Summary: (In Person-Declined) Patient declined AVS at this time.  Notes: FLU SHOT GIVEN

## 2024-05-12 DIAGNOSIS — T162XXA Foreign body in left ear, initial encounter: Secondary | ICD-10-CM | POA: Diagnosis not present

## 2024-05-20 ENCOUNTER — Other Ambulatory Visit: Payer: Self-pay | Admitting: Internal Medicine

## 2024-05-20 DIAGNOSIS — I10 Essential (primary) hypertension: Secondary | ICD-10-CM

## 2024-05-23 NOTE — Telephone Encounter (Signed)
 Requested Prescriptions  Pending Prescriptions Disp Refills   lisinopril -hydrochlorothiazide  (ZESTORETIC ) 20-12.5 MG tablet [Pharmacy Med Name: LISINOPRIL /HYDROCHLOROTHIAZIDE  20-12.5 TABLET] 90 tablet 0    Sig: TAKE (1) TABLET BY MOUTH EVERY DAY     Cardiovascular:  ACEI + Diuretic Combos Failed - 05/23/2024 10:06 AM      Failed - Na in normal range and within 180 days    Sodium  Date Value Ref Range Status  07/10/2023 140 134 - 144 mmol/L Final         Failed - K in normal range and within 180 days    Potassium  Date Value Ref Range Status  07/10/2023 4.3 3.5 - 5.2 mmol/L Final         Failed - Cr in normal range and within 180 days    Creatinine, Ser  Date Value Ref Range Status  07/10/2023 0.84 0.57 - 1.00 mg/dL Final         Failed - eGFR is 30 or above and within 180 days    GFR calc Af Amer  Date Value Ref Range Status  01/27/2020 87 >59 mL/min/1.73 Final    Comment:    **[facility] currently reports eGFR in compliance with the current**   recommendations of the [employer]. [facility] will   update reporting as new guidelines are published from the NKF-ASN   Task force.    GFR, Estimated  Date Value Ref Range Status  07/03/2022 >60 >60 mL/min Final    Comment:    (NOTE) Calculated using the CKD-EPI Creatinine Equation (2021)    eGFR  Date Value Ref Range Status  07/10/2023 68 >59 mL/min/1.73 Final         Passed - Patient is not pregnant      Passed - Last BP in normal range    BP Readings from Last 1 Encounters:  05/04/24 130/76         Passed - Valid encounter within last 6 months    Recent Outpatient Visits           3 months ago Type II diabetes mellitus with complication [facility])   [place] [facility] at [facility], Leita DEL, MD   6 months ago Benign hypertension   [place] [facility] at [facility], Leita DEL, MD   7 months ago PMR (polymyalgia rheumatica)   [facility] at [facility], Leita DEL, MD

## 2024-05-26 DIAGNOSIS — D485 Neoplasm of uncertain behavior of skin: Secondary | ICD-10-CM | POA: Diagnosis not present

## 2024-05-26 DIAGNOSIS — C4441 Basal cell carcinoma of skin of scalp and neck: Secondary | ICD-10-CM | POA: Diagnosis not present

## 2024-06-13 ENCOUNTER — Ambulatory Visit: Admitting: Internal Medicine

## 2024-06-13 ENCOUNTER — Encounter: Payer: Self-pay | Admitting: Internal Medicine

## 2024-06-13 VITALS — BP 122/78 | HR 79 | Ht 65.0 in | Wt 163.0 lb

## 2024-06-13 DIAGNOSIS — E118 Type 2 diabetes mellitus with unspecified complications: Secondary | ICD-10-CM | POA: Diagnosis not present

## 2024-06-13 DIAGNOSIS — I10 Essential (primary) hypertension: Secondary | ICD-10-CM

## 2024-06-13 DIAGNOSIS — J309 Allergic rhinitis, unspecified: Secondary | ICD-10-CM | POA: Diagnosis not present

## 2024-06-13 DIAGNOSIS — Z7984 Long term (current) use of oral hypoglycemic drugs: Secondary | ICD-10-CM

## 2024-06-13 DIAGNOSIS — K219 Gastro-esophageal reflux disease without esophagitis: Secondary | ICD-10-CM

## 2024-06-13 DIAGNOSIS — E039 Hypothyroidism, unspecified: Secondary | ICD-10-CM

## 2024-06-13 DIAGNOSIS — M353 Polymyalgia rheumatica: Secondary | ICD-10-CM | POA: Diagnosis not present

## 2024-06-13 LAB — POCT GLYCOSYLATED HEMOGLOBIN (HGB A1C): Hemoglobin A1C: 6.4 % — AB (ref 4.0–5.6)

## 2024-06-13 MED ORDER — FLUTICASONE PROPIONATE 50 MCG/ACT NA SUSP: 2.0000 | Freq: Every day | NASAL | 1 refills | Status: AC

## 2024-06-13 MED ORDER — GLIPIZIDE 5 MG PO TABS: 5.0000 mg | ORAL_TABLET | Freq: Every day | ORAL | 1 refills | Status: AC

## 2024-06-13 MED ORDER — LISINOPRIL-HYDROCHLOROTHIAZIDE 20-12.5 MG PO TABS
ORAL_TABLET | ORAL | 1 refills | Status: AC
Start: 2024-06-13 — End: ?

## 2024-06-13 MED ORDER — OMEPRAZOLE 20 MG PO CPDR: DELAYED_RELEASE_CAPSULE | ORAL | 1 refills | Status: AC

## 2024-06-13 MED ORDER — LEVOTHYROXINE SODIUM 50 MCG PO TABS: ORAL_TABLET | ORAL | 1 refills | Status: AC

## 2024-06-13 NOTE — Assessment & Plan Note (Addendum)
 Currently medications are glipizide .  No hypoglycemic episodes noted.  Last visit medical regimen changes were none. Lab Results  Component Value Date   HGBA1C 8.8 (A) 11/09/2023  A1C today =  6.4 - much improved. No change in medications at this time.

## 2024-06-13 NOTE — Progress Notes (Signed)
 Date:  06/13/2024   Name:  Jill Dunlap   DOB:  25-Mar-1935   MRN:  [id number]   Chief Complaint: Diabetes  Diabetes She presents for her follow-up diabetic visit. She has type 2 diabetes mellitus. Her disease course has been stable. Pertinent negatives for hypoglycemia include no headaches or tremors. Pertinent negatives for diabetes include no chest pain, no fatigue, no polydipsia and no polyuria. Current diabetic treatments: glucotrol . She is compliant with treatment all of the time. There is no change in her home blood glucose trend.    Review of Systems  Constitutional:  Negative for appetite change, fatigue, fever and unexpected weight change.  HENT:  Positive for ear discharge and hearing loss. Negative for tinnitus and trouble swallowing.   Eyes:  Negative for visual disturbance.  Respiratory:  Negative for cough, chest tightness and shortness of breath.   Cardiovascular:  Negative for chest pain, palpitations and leg swelling.  Gastrointestinal:  Negative for abdominal pain.  Endocrine: Negative for polydipsia and polyuria.  Genitourinary:  Negative for dysuria and hematuria.  Musculoskeletal:  Negative for arthralgias.  Neurological:  Negative for tremors, numbness and headaches.  Psychiatric/Behavioral:  Negative for dysphoric mood.      Lab Results  Component Value Date   NA 140 07/10/2023   K 4.3 07/10/2023   CO2 22 07/10/2023   GLUCOSE 148 (H) 07/10/2023   BUN 20 07/10/2023   CREATININE 0.84 07/10/2023   CALCIUM 9.5 07/10/2023   EGFR 68 07/10/2023   GFRNONAA >60 07/03/2022   Lab Results  Component Value Date   CHOL 155 07/10/2023   HDL 47 07/10/2023   LDLCALC 87 07/10/2023   TRIG 114 07/10/2023   CHOLHDL 3.3 07/10/2023   Lab Results  Component Value Date   TSH 2.040 07/10/2023   Lab Results  Component Value Date   HGBA1C 6.4 (A) 06/13/2024   Lab Results  Component Value Date   WBC 5.6 07/10/2023   HGB 11.7 07/10/2023   HCT 37.1  07/10/2023   MCV 86 07/10/2023   PLT 247 07/10/2023   Lab Results  Component Value Date   ALT 14 07/10/2023   AST 19 07/10/2023   ALKPHOS 53 07/10/2023   BILITOT 0.3 07/10/2023   No results found for: MARIEN BOLLS, VD25OH   Patient Active Problem List   Diagnosis Date Noted   Palpitations 02/08/2024   Osteoarthritis of fingers of both hands 07/09/2023   Swelling of digit of right hand 05/21/2023   Chronic pain of right wrist 08/07/2022   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   Left lumbar radiculopathy 03/13/2021   Piriformis syndrome, left 02/12/2021   Aortic atherosclerosis 08/01/2020   Shoulder impingement 02/02/2020   PMR (polymyalgia rheumatica) 02/02/2020   Primary osteoarthritis of left hip 01/24/2020   Diffuse photodamage of skin 02/13/2016   Basal cell carcinoma 01/11/2016   Gastroesophageal reflux disease without esophagitis 07/03/2015   Type II diabetes mellitus with complication (HCC) 03/15/2015   Acquired hypothyroidism 10/31/2014   Allergic rhinitis 10/31/2014   Benign hypertension 10/31/2014   CD (celiac disease) 10/31/2014   Hyperlipidemia associated with type 2 diabetes mellitus (HCC) 10/31/2014   OP (osteoporosis) 10/31/2014    Allergies  Allergen Reactions   Ciprofloxacin  Hcl Rash    Past Surgical History:  Procedure Laterality Date   ABDOMINAL HYSTERECTOMY  1984   for bleeding   Basil cell     BILATERAL SALPINGOOPHORECTOMY  1984   CATARACT EXTRACTION W/PHACO Right 11/24/2016   Procedure: CATARACT EXTRACTION  PHACO AND INTRAOCULAR LENS PLACEMENT (IOC) Right;  Surgeon: Mittie Gaskin, MD;  Location: [facility];  Service: Ophthalmology;  Laterality: Right;   CATARACT EXTRACTION W/PHACO Left 02/04/2017   Procedure: CATARACT EXTRACTION PHACO AND INTRAOCULAR LENS PLACEMENT (IOC)  Left;  Surgeon: Mittie Gaskin, MD;  Location: [facility];  Service: Ophthalmology;  Laterality: Left;   ORIF WRIST FRACTURE Left 2015    TYMPANOPLASTY  1992    Social History   Tobacco Use   Smoking status: Never   Smokeless tobacco: Never  Vaping Use   Vaping status: Never Used  Substance Use Topics   Alcohol use: Never   Drug use: Never     Medication list has been reviewed and updated.  Current Meds  Medication Sig   azelastine  (ASTELIN ) 0.1 % nasal spray INSTILL 2 SPRAYS IN EACH NOSTRIL TWICE DAILY AS DIRECTED   Biotin 2500 MCG CAPS Take 1 capsule by mouth daily.   calcium-vitamin D (OSCAL WITH D) 500-200 MG-UNIT tablet Take 1 tablet by mouth 2 (two) times daily.   cholecalciferol (VITAMIN D3) 25 MCG (1000 UNIT) tablet Take 1,000 Units by mouth daily.   Cranberry 500 MG CAPS Take 1 capsule by mouth daily.    fexofenadine (ALLEGRA) 180 MG tablet Take 1 tablet by mouth daily.   Multiple Vitamins-Minerals (CENTRUM SILVER PO) Take 1 tablet by mouth daily.   predniSONE  (DELTASONE ) 20 MG tablet Take 2 tablets (40 mg total) by mouth daily with breakfast.   simvastatin  (ZOCOR ) 20 MG tablet TAKE (1) TABLET BY MOUTH EVERY DAY AT 6:00 IN THE EVENING   TURMERIC PO Take 1 tablet by mouth daily.    [DISCONTINUED] fluticasone  (FLONASE ) 50 MCG/ACT nasal spray PLACE TWO (2) SPRAYS INTO BOTH NOSTRILS DAILY.   [DISCONTINUED] glipiZIDE  (GLUCOTROL ) 5 MG tablet TAKE ONE (1) TABLET BY MOUTH DAILY BEFORE BREAKFAST.   [DISCONTINUED] levothyroxine  (SYNTHROID ) 50 MCG tablet TAKE (1) TABLET BY MOUTH EVERY DAY BEFORE BREAKFAST   [DISCONTINUED] lisinopril -hydrochlorothiazide  (ZESTORETIC ) 20-12.5 MG tablet TAKE (1) TABLET BY MOUTH EVERY DAY   [DISCONTINUED] omeprazole  (PRILOSEC) 20 MG capsule TAKE (1) CAPSULE BY MOUTH EVERY DAY       06/13/2024    1:18 PM 02/08/2024    1:22 PM 11/09/2023   10:46 AM 10/02/2023    9:13 AM  GAD 7 : Generalized Anxiety Score  Nervous, Anxious, on Edge 0 0 0 0  Control/stop worrying 0 0 0 0  Worry too much - different things 0 0 0 0  Trouble relaxing 0 0 0 0  Restless 0 0 0 0  Easily annoyed or  irritable 0 0 0 0  Afraid - awful might happen 0 0 0 0  Total GAD 7 Score 0 0 0 0  Anxiety Difficulty Not difficult at all Not difficult at all Not difficult at all Not difficult at all       06/13/2024    1:18 PM 05/04/2024    3:39 PM 02/08/2024    1:22 PM  Depression screen PHQ 2/9  Decreased Interest 0 0 0  Down, Depressed, Hopeless 0 0 0  PHQ - 2 Score 0 0 0  Altered sleeping 0 0 0  Tired, decreased energy 0 0 0  Change in appetite 0 0 0  Feeling bad or failure about yourself  0 0 0  Trouble concentrating 0 0 0  Moving slowly or fidgety/restless 0 0 0  Suicidal thoughts 0 0 0  PHQ-9 Score 0 0  0   Difficult doing  work/chores Not difficult at all Not difficult at all Not difficult at all     Data saved with a previous flowsheet row definition    BP Readings from Last 3 Encounters:  06/13/24 122/78  05/04/24 130/76  02/08/24 124/74    Physical Exam Vitals and nursing note reviewed.  Constitutional:      General: She is not in acute distress.    Appearance: She is well-developed.  HENT:     Head: Normocephalic and atraumatic.     Left Ear: There is impacted cerumen.  Pulmonary:     Effort: Pulmonary effort is normal. No respiratory distress.  Skin:    General: Skin is warm and dry.     Findings: No rash.  Neurological:     Mental Status: She is alert and oriented to person, place, and time.  Psychiatric:        Mood and Affect: Mood normal.        Behavior: Behavior normal.     Wt Readings from Last 3 Encounters:  06/13/24 163 lb (73.9 kg)  05/04/24 166 lb 3.2 oz (75.4 kg)  02/08/24 166 lb (75.3 kg)    BP 122/78   Pulse 79   Ht 5' 5 (1.651 m)   Wt 163 lb (73.9 kg)   SpO2 98%   BMI 27.12 kg/m   Assessment and Plan:  Problem List Items Addressed This Visit       Unprioritized   Acquired hypothyroidism (Chronic)   Supplemented.      Relevant Medications   levothyroxine  (SYNTHROID ) 50 MCG tablet   Allergic rhinitis   Relevant Medications    fluticasone  (FLONASE ) 50 MCG/ACT nasal spray   Benign hypertension (Chronic)   Well controlled blood pressure today. Current regimen is hydrochlorothiazide  and lisinopril . No medication side effects noted.        Relevant Medications   lisinopril -hydrochlorothiazide  (ZESTORETIC ) 20-12.5 MG tablet   Type II diabetes mellitus with complication (HCC) - Primary (Chronic)   Currently medications are glipizide .  No hypoglycemic episodes noted.  Last visit medical regimen changes were none. Lab Results  Component Value Date   HGBA1C 8.8 (A) 11/09/2023  A1C today =  6.4 - much improved. No change in medications at this time.       Relevant Medications   glipiZIDE  (GLUCOTROL ) 5 MG tablet   lisinopril -hydrochlorothiazide  (ZESTORETIC ) 20-12.5 MG tablet   Other Relevant Orders   POCT glycosylated hemoglobin (Hb A1C) (Completed)   Gastroesophageal reflux disease without esophagitis (Chronic)   Relevant Medications   omeprazole  (PRILOSEC) 20 MG capsule   PMR (polymyalgia rheumatica) (Chronic)   Now on 4 mg prednisone  daily and being followed by Rheumatology Functioning well.      Other Visit Diagnoses       Long term current use of oral hypoglycemic drug           No follow-ups on file.    Leita HILARIO Adie, MD [facility] [place]

## 2024-06-13 NOTE — Assessment & Plan Note (Signed)
 Now on 4 mg prednisone  daily and being followed by Rheumatology Functioning well.

## 2024-06-13 NOTE — Assessment & Plan Note (Signed)
 Supplemented

## 2024-06-13 NOTE — Assessment & Plan Note (Signed)
 Well controlled blood pressure today. Current regimen is hydrochlorothiazide  and lisinopril . No medication side effects noted.

## 2024-06-14 DIAGNOSIS — L272 Dermatitis due to ingested food: Secondary | ICD-10-CM | POA: Diagnosis not present

## 2024-06-14 DIAGNOSIS — H6122 Impacted cerumen, left ear: Secondary | ICD-10-CM | POA: Diagnosis not present

## 2024-06-14 DIAGNOSIS — H6061 Unspecified chronic otitis externa, right ear: Secondary | ICD-10-CM | POA: Diagnosis not present

## 2024-06-20 ENCOUNTER — Encounter: Admitting: Physician Assistant

## 2024-07-01 ENCOUNTER — Encounter: Payer: Self-pay | Admitting: Internal Medicine

## 2024-07-01 ENCOUNTER — Ambulatory Visit: Payer: Self-pay

## 2024-07-01 ENCOUNTER — Ambulatory Visit: Admitting: Internal Medicine

## 2024-07-01 VITALS — BP 118/64 | HR 84 | Temp 98.2°F | Ht 65.0 in | Wt 163.0 lb

## 2024-07-01 DIAGNOSIS — J0100 Acute maxillary sinusitis, unspecified: Secondary | ICD-10-CM

## 2024-07-01 DIAGNOSIS — R051 Acute cough: Secondary | ICD-10-CM | POA: Diagnosis not present

## 2024-07-01 DIAGNOSIS — B3731 Acute candidiasis of vulva and vagina: Secondary | ICD-10-CM

## 2024-07-01 MED ORDER — AMOXICILLIN-POT CLAVULANATE 875-125 MG PO TABS: 1.0000 | ORAL_TABLET | Freq: Two times a day (BID) | ORAL | 0 refills | Status: AC

## 2024-07-01 MED ORDER — FLUCONAZOLE 100 MG PO TABS: 100.0000 mg | ORAL_TABLET | Freq: Every day | ORAL | 0 refills | Status: AC

## 2024-07-01 MED ORDER — PROMETHAZINE-DM 6.25-15 MG/5ML PO SYRP: 5.0000 mL | ORAL_SOLUTION | Freq: Four times a day (QID) | ORAL | 0 refills | Status: AC | PRN

## 2024-07-01 NOTE — Telephone Encounter (Signed)
 Noted  Pt has appt.  KP

## 2024-07-01 NOTE — Progress Notes (Signed)
 Date:  07/01/2024   Name:  Jill Dunlap   DOB:  02/25/1935   MRN:  [id number]   Chief Complaint: Cough (X 1 week. Not sleeping due to cough. Mucous is stuck in chest. Tylenol  cold and flu not helping.)  Cough This is a new problem. The current episode started in the past 7 days. The problem has been gradually worsening. The problem occurs every few minutes. The cough is Productive of sputum. Associated symptoms include postnasal drip, a sore throat and shortness of breath (when coughing). Pertinent negatives include no chest pain, chills, fever, headaches or wheezing.    Review of Systems  Constitutional:  Positive for fatigue. Negative for chills and fever.  HENT:  Positive for congestion, postnasal drip, sinus pressure, sneezing and sore throat. Negative for trouble swallowing.   Respiratory:  Positive for cough and shortness of breath (when coughing). Negative for chest tightness and wheezing.   Cardiovascular:  Negative for chest pain and palpitations.  Genitourinary:        Concerned about yeast infection from antibiotics  Neurological:  Negative for dizziness and headaches.  Psychiatric/Behavioral:  Positive for sleep disturbance. Negative for dysphoric mood. The patient is not nervous/anxious.      Lab Results  Component Value Date   NA 140 07/10/2023   K 4.3 07/10/2023   CO2 22 07/10/2023   GLUCOSE 148 (H) 07/10/2023   BUN 20 07/10/2023   CREATININE 0.84 07/10/2023   CALCIUM 9.5 07/10/2023   EGFR 68 07/10/2023   GFRNONAA >60 07/03/2022   Lab Results  Component Value Date   CHOL 155 07/10/2023   HDL 47 07/10/2023   LDLCALC 87 07/10/2023   TRIG 114 07/10/2023   CHOLHDL 3.3 07/10/2023   Lab Results  Component Value Date   TSH 2.040 07/10/2023   Lab Results  Component Value Date   HGBA1C 6.4 (A) 06/13/2024   Lab Results  Component Value Date   WBC 5.6 07/10/2023   HGB 11.7 07/10/2023   HCT 37.1 07/10/2023   MCV 86 07/10/2023   PLT 247 07/10/2023    Lab Results  Component Value Date   ALT 14 07/10/2023   AST 19 07/10/2023   ALKPHOS 53 07/10/2023   BILITOT 0.3 07/10/2023   No results found for: MARIEN BOLLS, VD25OH   Patient Active Problem List   Diagnosis Date Noted   Palpitations 02/08/2024   Osteoarthritis of fingers of both hands 07/09/2023   Swelling of digit of right hand 05/21/2023   Chronic pain of right wrist 08/07/2022   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   Left lumbar radiculopathy 03/13/2021   Piriformis syndrome, left 02/12/2021   Aortic atherosclerosis 08/01/2020   Shoulder impingement 02/02/2020   PMR (polymyalgia rheumatica) 02/02/2020   Primary osteoarthritis of left hip 01/24/2020   Diffuse photodamage of skin 02/13/2016   Basal cell carcinoma 01/11/2016   Gastroesophageal reflux disease without esophagitis 07/03/2015   Type II diabetes mellitus with complication (HCC) 03/15/2015   Acquired hypothyroidism 10/31/2014   Allergic rhinitis 10/31/2014   Benign hypertension 10/31/2014   CD (celiac disease) 10/31/2014   Hyperlipidemia associated with type 2 diabetes mellitus (HCC) 10/31/2014   OP (osteoporosis) 10/31/2014    Allergies[1]  Past Surgical History:  Procedure Laterality Date   ABDOMINAL HYSTERECTOMY  1984   for bleeding   Basil cell     BILATERAL SALPINGOOPHORECTOMY  1984   CATARACT EXTRACTION W/PHACO Right 11/24/2016   Procedure: CATARACT EXTRACTION PHACO AND INTRAOCULAR LENS PLACEMENT (IOC) Right;  Surgeon: Mittie Gaskin, MD;  Location: [facility];  Service: Ophthalmology;  Laterality: Right;   CATARACT EXTRACTION W/PHACO Left 02/04/2017   Procedure: CATARACT EXTRACTION PHACO AND INTRAOCULAR LENS PLACEMENT (IOC)  Left;  Surgeon: Mittie Gaskin, MD;  Location: [facility];  Service: Ophthalmology;  Laterality: Left;   ORIF WRIST FRACTURE Left 2015   TYMPANOPLASTY  1992    Social History[2]   Medication list has been reviewed and  updated.  Active Medications[3]     07/01/2024   11:19 AM 06/13/2024    1:18 PM 02/08/2024    1:22 PM 11/09/2023   10:46 AM  GAD 7 : Generalized Anxiety Score  Nervous, Anxious, on Edge 0 0 0 0  Control/stop worrying 0 0 0 0  Worry too much - different things 0 0 0 0  Trouble relaxing 0 0 0 0  Restless 0 0 0 0  Easily annoyed or irritable 0 0 0 0  Afraid - awful might happen 0 0 0 0  Total GAD 7 Score 0 0 0 0  Anxiety Difficulty Not difficult at all Not difficult at all Not difficult at all Not difficult at all       07/01/2024   11:19 AM 06/13/2024    1:18 PM 05/04/2024    3:39 PM  Depression screen PHQ 2/9  Decreased Interest 0 0 0  Down, Depressed, Hopeless 0 0 0  PHQ - 2 Score 0 0 0  Altered sleeping 0 0 0  Tired, decreased energy 0 0 0  Change in appetite 0 0 0  Feeling bad or failure about yourself  0 0 0  Trouble concentrating 0 0 0  Moving slowly or fidgety/restless 0 0 0  Suicidal thoughts 0 0 0  PHQ-9 Score 0 0 0   Difficult doing work/chores Not difficult at all Not difficult at all Not difficult at all     Data saved with a previous flowsheet row definition    BP Readings from Last 3 Encounters:  07/01/24 118/64  06/13/24 122/78  05/04/24 130/76    Physical Exam HENT:     Head: Normocephalic.     Nose:     Right Sinus: No maxillary sinus tenderness or frontal sinus tenderness.     Left Sinus: No maxillary sinus tenderness or frontal sinus tenderness.     Mouth/Throat:     Pharynx: Posterior oropharyngeal erythema present. No oropharyngeal exudate.  Cardiovascular:     Rate and Rhythm: Normal rate and regular rhythm.  Pulmonary:     Effort: Pulmonary effort is normal.     Breath sounds: Transmitted upper airway sounds present. No wheezing.  Musculoskeletal:     Cervical back: Normal range of motion.  Lymphadenopathy:     Cervical: No cervical adenopathy.  Neurological:     Mental Status: She is alert.  Psychiatric:        Attention and  Perception: Attention normal.        Mood and Affect: Mood and affect normal.     Wt Readings from Last 3 Encounters:  07/01/24 163 lb (73.9 kg)  06/13/24 163 lb (73.9 kg)  05/04/24 166 lb 3.2 oz (75.4 kg)    BP 118/64   Pulse 84   Temp 98.2 F (36.8 C)   Ht 5' 5 (1.651 m)   Wt 163 lb (73.9 kg)   SpO2 96%   BMI 27.12 kg/m   Assessment and Plan:  Problem List Items Addressed This Visit   None  Visit Diagnoses       Acute non-recurrent maxillary sinusitis    -  Primary   continue zyrtec daily; tylenol  prn sore throat or headache   Relevant Medications   amoxicillin -clavulanate (AUGMENTIN ) 875-125 MG tablet   fluconazole (DIFLUCAN) 100 MG tablet   promethazine -dextromethorphan (PROMETHAZINE -DM) 6.25-15 MG/5ML syrup     Acute cough       likely due to PND - possible early bronchitis.   Relevant Medications   promethazine -dextromethorphan (PROMETHAZINE -DM) 6.25-15 MG/5ML syrup     Yeast vaginitis       take diflucan if symptoms appear   Relevant Medications   fluconazole (DIFLUCAN) 100 MG tablet       No follow-ups on file.    Leita HILARIO Adie, MD [facility] [place]           [1]  Allergies Allergen Reactions   Ciprofloxacin  Hcl Rash  [2]  Social History Tobacco Use   Smoking status: Never   Smokeless tobacco: Never  Vaping Use   Vaping status: Never Used  Substance Use Topics   Alcohol use: Never   Drug use: Never  [3]  Current Meds  Medication Sig   amoxicillin -clavulanate (AUGMENTIN ) 875-125 MG tablet Take 1 tablet by mouth 2 (two) times daily for 10 days.   azelastine  (ASTELIN ) 0.1 % nasal spray INSTILL 2 SPRAYS IN EACH NOSTRIL TWICE DAILY AS DIRECTED   Biotin 2500 MCG CAPS Take 1 capsule by mouth daily.   calcium-vitamin D (OSCAL WITH D) 500-200 MG-UNIT tablet Take 1 tablet by mouth 2 (two) times daily.   cholecalciferol (VITAMIN D3) 25 MCG (1000 UNIT) tablet Take 1,000 Units by mouth daily.   Cranberry  500 MG CAPS Take 1 capsule by mouth daily.    fexofenadine (ALLEGRA) 180 MG tablet Take 1 tablet by mouth daily.   fluconazole (DIFLUCAN) 100 MG tablet Take 1 tablet (100 mg total) by mouth daily for 3 days.   fluticasone  (FLONASE ) 50 MCG/ACT nasal spray Place 2 sprays into both nostrils daily.   glipiZIDE  (GLUCOTROL ) 5 MG tablet Take 1 tablet (5 mg total) by mouth daily before breakfast.   levothyroxine  (SYNTHROID ) 50 MCG tablet TAKE (1) TABLET BY MOUTH EVERY DAY BEFORE BREAKFAST   lisinopril -hydrochlorothiazide  (ZESTORETIC ) 20-12.5 MG tablet TAKE (1) TABLET BY MOUTH EVERY DAY   Multiple Vitamins-Minerals (CENTRUM SILVER PO) Take 1 tablet by mouth daily.   omeprazole  (PRILOSEC) 20 MG capsule TAKE (1) CAPSULE BY MOUTH EVERY DAY   predniSONE  (DELTASONE ) 20 MG tablet Take 2 tablets (40 mg total) by mouth daily with breakfast.   promethazine -dextromethorphan (PROMETHAZINE -DM) 6.25-15 MG/5ML syrup Take 5 mLs by mouth 4 (four) times daily as needed for up to 9 days for cough.   simvastatin  (ZOCOR ) 20 MG tablet TAKE (1) TABLET BY MOUTH EVERY DAY AT 6:00 IN THE EVENING   TURMERIC PO Take 1 tablet by mouth daily.

## 2024-07-01 NOTE — Telephone Encounter (Signed)
 FYI Only or Action Required?: FYI only for provider: appointment scheduled on 07/01/24.  Patient was last seen in primary care on 06/13/2024 by Justus Leita DEL, MD.  Called Nurse Triage reporting Cough.  Symptoms began a week ago.  Interventions attempted: OTC medications: tylenol  cold and flu; cough drops.  Symptoms are: unchanged.  Triage Disposition: See HCP Within 4 Hours (Or PCP Triage)  Patient/caregiver understands and will follow disposition?: Yes  Copied from CRM #[id number]. Topic: Clinical - Red Word Triage >> Jul 01, 2024 10:10 AM Wess RAMAN wrote: [facility] that prompted transfer to Nurse Triage: Congestion, cough, and wheezing for 7 days. OTC medicines not working  Would like an appt with anyone at [facility] [employer] Reason for Disposition  [1] MILD difficulty breathing (e.g., minimal/no SOB at rest, SOB with walking, pulse < 100) AND [2] still present when not coughing  Answer Assessment - Initial Assessment Questions Pt called in stating she has tried tylenol  cold & flu medication and cough drops x 1 week without relief. PT reports she has HTN so she isn't able to take a lot of otc medications d/t interactions. PT having a difficult time hearing RN d/t congestion. Pt denies fever. Appointment scheduled for evaluation. Patient agrees with plan of care, and will call back if anything changes, or if symptoms worsen.     1. ONSET: When did the cough begin?      7 days ago  2. SEVERITY: How bad is the cough today?      Pt states it is more congestion, pt has a difficult time hearing RN d/t reported congestion   5. DIFFICULTY BREATHING: Are you having difficulty breathing? If Yes, ask: How bad is it? (e.g., mild, moderate, severe)      With coughing  6. FEVER: Do you have a fever? If Yes, ask: What is your temperature, how was it measured, and when did it start?     No; denies fever   7. CARDIAC HISTORY: Do you have any history  of heart disease? (e.g., heart attack, congestive heart failure)      HTN   10. OTHER SYMPTOMS: Do you have any other symptoms? (e.g., runny nose, wheezing, chest pain)       Congestion, cough and wheezing  Protocols used: Cough - Acute Productive-A-AH

## 2024-10-10 ENCOUNTER — Encounter: Admitting: Physician Assistant

## 2025-05-17 ENCOUNTER — Ambulatory Visit
